# Patient Record
Sex: Male | Born: 1950
Health system: Southern US, Community
[De-identification: ages and names within clinical notes are randomized; demographics above are authoritative.]

## PROBLEM LIST (undated history)

## (undated) DIAGNOSIS — I10 Essential (primary) hypertension: Secondary | ICD-10-CM

## (undated) DIAGNOSIS — E785 Hyperlipidemia, unspecified: Secondary | ICD-10-CM

## (undated) DIAGNOSIS — I219 Acute myocardial infarction, unspecified: Secondary | ICD-10-CM

## (undated) HISTORY — PX: CORONARY STENT PLACEMENT: SHX1402

## (undated) HISTORY — PX: KNEE SURGERY: SHX244

## (undated) HISTORY — PX: VASECTOMY: SHX75

## (undated) HISTORY — PX: CARDIAC CATHETERIZATION: SHX172

## (undated) HISTORY — DX: Hyperlipidemia, unspecified: E78.5

---

## 2000-09-19 ENCOUNTER — Ambulatory Visit (HOSPITAL_COMMUNITY): Admission: RE | Admit: 2000-09-19 | Discharge: 2000-09-19 | Payer: Self-pay | Admitting: *Deleted

## 2000-09-19 ENCOUNTER — Encounter: Payer: Self-pay | Admitting: *Deleted

## 2004-12-22 ENCOUNTER — Ambulatory Visit (HOSPITAL_BASED_OUTPATIENT_CLINIC_OR_DEPARTMENT_OTHER): Admission: RE | Admit: 2004-12-22 | Discharge: 2004-12-22 | Payer: Self-pay | Admitting: Orthopedic Surgery

## 2011-02-05 ENCOUNTER — Inpatient Hospital Stay (HOSPITAL_COMMUNITY)
Admission: EM | Admit: 2011-02-05 | Discharge: 2011-02-10 | DRG: 247 | Disposition: A | Discharge status: Home / Self Care | Payer: No Typology Code available for payment source | Attending: Cardiovascular Disease | Admitting: Cardiovascular Disease

## 2011-02-05 ENCOUNTER — Emergency Department (HOSPITAL_COMMUNITY): Payer: No Typology Code available for payment source

## 2011-02-05 DIAGNOSIS — G4730 Sleep apnea, unspecified: Secondary | ICD-10-CM | POA: Diagnosis present

## 2011-02-05 DIAGNOSIS — I214 Non-ST elevation (NSTEMI) myocardial infarction: Principal | ICD-10-CM | POA: Diagnosis present

## 2011-02-05 DIAGNOSIS — E785 Hyperlipidemia, unspecified: Secondary | ICD-10-CM | POA: Diagnosis present

## 2011-02-05 DIAGNOSIS — I10 Essential (primary) hypertension: Secondary | ICD-10-CM | POA: Diagnosis present

## 2011-02-05 DIAGNOSIS — I2510 Atherosclerotic heart disease of native coronary artery without angina pectoris: Secondary | ICD-10-CM | POA: Diagnosis present

## 2011-02-05 DIAGNOSIS — E669 Obesity, unspecified: Secondary | ICD-10-CM | POA: Diagnosis present

## 2011-02-05 DIAGNOSIS — Z7982 Long term (current) use of aspirin: Secondary | ICD-10-CM

## 2011-02-05 DIAGNOSIS — Z8249 Family history of ischemic heart disease and other diseases of the circulatory system: Secondary | ICD-10-CM

## 2011-02-05 LAB — CBC
HCT: 46.7 % (ref 39.0–52.0)
Hemoglobin: 16.8 g/dL (ref 13.0–17.0)
MCH: 31.5 pg (ref 26.0–34.0)
MCHC: 36 g/dL (ref 30.0–36.0)
MCV: 87.6 fL (ref 78.0–100.0)
Platelets: 145 10*3/uL — ABNORMAL LOW (ref 150–400)
RBC: 5.33 MIL/uL (ref 4.22–5.81)
RDW: 13 % (ref 11.5–15.5)
WBC: 9.2 10*3/uL (ref 4.0–10.5)

## 2011-02-05 LAB — DIFFERENTIAL
Basophils Absolute: 0 10*3/uL (ref 0.0–0.1)
Basophils Relative: 0 % (ref 0–1)
Eosinophils Absolute: 0.4 10*3/uL (ref 0.0–0.7)
Eosinophils Relative: 5 % (ref 0–5)
Lymphocytes Relative: 32 % (ref 12–46)
Lymphs Abs: 2.9 10*3/uL (ref 0.7–4.0)
Monocytes Absolute: 0.8 10*3/uL (ref 0.1–1.0)
Monocytes Relative: 9 % (ref 3–12)
Neutro Abs: 5 10*3/uL (ref 1.7–7.7)
Neutrophils Relative %: 54 % (ref 43–77)

## 2011-02-05 LAB — BASIC METABOLIC PANEL
CO2: 29 mEq/L (ref 19–32)
Glucose, Bld: 129 mg/dL — ABNORMAL HIGH (ref 70–99)
Potassium: 3.6 mEq/L (ref 3.5–5.1)
Sodium: 139 mEq/L (ref 135–145)

## 2011-02-06 HISTORY — PX: CORONARY ANGIOPLASTY: SHX604

## 2011-02-06 HISTORY — PX: CARDIAC CATHETERIZATION: SHX172

## 2011-02-06 LAB — BRAIN NATRIURETIC PEPTIDE: Pro B Natriuretic peptide (BNP): 102 pg/mL — ABNORMAL HIGH (ref 0.0–100.0)

## 2011-02-06 LAB — CARDIAC PANEL(CRET KIN+CKTOT+MB+TROPI)
CK, MB: 3.6 ng/mL (ref 0.3–4.0)
Relative Index: 2.1 (ref 0.0–2.5)
Relative Index: 4.6 — ABNORMAL HIGH (ref 0.0–2.5)
Total CK: 172 U/L (ref 7–232)
Troponin I: 0.36 ng/mL — ABNORMAL HIGH (ref 0.00–0.06)

## 2011-02-06 LAB — PROTIME-INR
INR: 0.91 (ref 0.00–1.49)
Prothrombin Time: 12.5 s (ref 11.6–15.2)

## 2011-02-06 LAB — CK TOTAL AND CKMB (NOT AT ARMC)
CK, MB: 3.5 ng/mL (ref 0.3–4.0)
Relative Index: 1.8 (ref 0.0–2.5)
Total CK: 199 U/L (ref 7–232)

## 2011-02-06 LAB — CBC
HCT: 44.7 % (ref 39.0–52.0)
Hemoglobin: 16 g/dL (ref 13.0–17.0)
MCH: 31.3 pg (ref 26.0–34.0)
MCHC: 35.8 g/dL (ref 30.0–36.0)
MCV: 87.3 fL (ref 78.0–100.0)
Platelets: 139 K/uL — ABNORMAL LOW (ref 150–400)
RBC: 5.12 MIL/uL (ref 4.22–5.81)
RDW: 13 % (ref 11.5–15.5)
WBC: 9.4 K/uL (ref 4.0–10.5)

## 2011-02-06 LAB — POCT CARDIAC MARKERS
CKMB, poc: 1.8 ng/mL (ref 1.0–8.0)
Myoglobin, poc: 75.6 ng/mL (ref 12–200)
Troponin i, poc: <0.05 ng/mL (ref 0.00–0.09)

## 2011-02-06 LAB — POCT ACTIVATED CLOTTING TIME: Activated Clotting Time: 499 seconds

## 2011-02-06 LAB — LIPID PANEL
Cholesterol: 244 mg/dL — ABNORMAL HIGH (ref 0–200)
LDL Cholesterol: 172 mg/dL — ABNORMAL HIGH (ref 0–99)
Total CHOL/HDL Ratio: 5.7 RATIO

## 2011-02-06 LAB — HEPARIN LEVEL (UNFRACTIONATED): Heparin Unfractionated: 0.12 [IU]/mL — ABNORMAL LOW (ref 0.30–0.70)

## 2011-02-06 LAB — TROPONIN I: Troponin I: 0.1 ng/mL — ABNORMAL HIGH (ref 0.00–0.06)

## 2011-02-06 LAB — MRSA PCR SCREENING: MRSA by PCR: NEGATIVE

## 2011-02-06 LAB — APTT: aPTT: 29 seconds (ref 24–37)

## 2011-02-07 HISTORY — PX: DOPPLER ECHOCARDIOGRAPHY: SHX263

## 2011-02-07 LAB — BASIC METABOLIC PANEL
BUN: 10 mg/dL (ref 6–23)
CO2: 26 mEq/L (ref 19–32)
Calcium: 8.8 mg/dL (ref 8.4–10.5)
Creatinine, Ser: 1.03 mg/dL (ref 0.4–1.5)
GFR calc non Af Amer: >60 mL/min (ref >60)
Glucose, Bld: 116 mg/dL — ABNORMAL HIGH (ref 70–99)

## 2011-02-07 LAB — HEPARIN LEVEL (UNFRACTIONATED): Heparin Unfractionated: 0.27 IU/mL — ABNORMAL LOW (ref 0.30–0.70)

## 2011-02-07 LAB — CBC
HCT: 40.6 % (ref 39.0–52.0)
Hemoglobin: 14.2 g/dL (ref 13.0–17.0)
MCH: 30.8 pg (ref 26.0–34.0)
MCHC: 35 g/dL (ref 30.0–36.0)
MCV: 88.1 fL (ref 78.0–100.0)
RDW: 13.1 % (ref 11.5–15.5)

## 2011-02-08 LAB — BASIC METABOLIC PANEL
CO2: 28 mEq/L (ref 19–32)
Calcium: 8.9 mg/dL (ref 8.4–10.5)
Creatinine, Ser: 1.05 mg/dL (ref 0.4–1.5)
GFR calc Af Amer: >60 mL/min (ref >60)
GFR calc non Af Amer: >60 mL/min (ref >60)
Sodium: 138 mEq/L (ref 135–145)

## 2011-02-08 LAB — PROTIME-INR: Prothrombin Time: 13.2 seconds (ref 11.6–15.2)

## 2011-02-08 LAB — CBC
Hemoglobin: 14.2 g/dL (ref 13.0–17.0)
MCH: 30.6 pg (ref 26.0–34.0)
MCHC: 34.4 g/dL (ref 30.0–36.0)
Platelets: 120 10*3/uL — ABNORMAL LOW (ref 150–400)
RBC: 4.64 MIL/uL (ref 4.22–5.81)

## 2011-02-08 LAB — HEPARIN LEVEL (UNFRACTIONATED): Heparin Unfractionated: 0.32 IU/mL (ref 0.30–0.70)

## 2011-02-09 LAB — CBC
MCH: 30.9 pg (ref 26.0–34.0)
MCV: 88.7 fL (ref 78.0–100.0)
Platelets: 134 10*3/uL — ABNORMAL LOW (ref 150–400)
RDW: 13.2 % (ref 11.5–15.5)

## 2011-02-09 LAB — BASIC METABOLIC PANEL
BUN: 10 mg/dL (ref 6–23)
CO2: 30 mEq/L (ref 19–32)
Chloride: 105 mEq/L (ref 96–112)
Creatinine, Ser: 1.19 mg/dL (ref 0.4–1.5)
Glucose, Bld: 104 mg/dL — ABNORMAL HIGH (ref 70–99)

## 2011-02-09 LAB — HEPARIN LEVEL (UNFRACTIONATED): Heparin Unfractionated: 0.33 IU/mL (ref 0.30–0.70)

## 2011-02-10 LAB — BASIC METABOLIC PANEL
BUN: 8 mg/dL (ref 6–23)
Calcium: 9.4 mg/dL (ref 8.4–10.5)
Chloride: 104 mEq/L (ref 96–112)
Creatinine, Ser: 1.03 mg/dL (ref 0.4–1.5)

## 2011-02-10 LAB — CBC
MCH: 31 pg (ref 26.0–34.0)
MCHC: 35.4 g/dL (ref 30.0–36.0)
MCV: 87.7 fL (ref 78.0–100.0)
Platelets: 150 10*3/uL (ref 150–400)
RBC: 4.8 MIL/uL (ref 4.22–5.81)
RDW: 13.1 % (ref 11.5–15.5)

## 2011-02-13 LAB — POCT ACTIVATED CLOTTING TIME: Activated Clotting Time: 446 seconds

## 2011-02-19 NOTE — Procedures (Signed)
NAMETARL, CEPHAS NO.:  [id number]  MEDICAL RECORD NO.:  [id number]           PATIENT TYPE:  I  LOCATION:  2920                         FACILITY:  [facility]  PHYSICIAN:  Nicki Guadalajara, M.D.     DATE OF BIRTH:  1951/03/28  DATE OF PROCEDURE:  02/06/2011 DATE OF DISCHARGE:                           CARDIAC CATHETERIZATION   INDICATIONS:  [name] is a 60 year old gentleman who had previously undergone coronary angiography for chest pain approximately 7 years ago and was told of having essentially normal coronary arteries. Over the past month, the patient has noticed a change in development of exertional shortness of breath.  Over the past week, he has developed increasing episodes of recurrent chest pain.  Yesterday, he developed a prolonged episode of 2 hours of chest pain prompting [facility] evaluation and admission.  Initial troponin was 0.10.  CPK 199, MB normal.  EKG showed sinus rhythm with right bundle-branch block and LVH. Due to worrisome symptoms for unstable angina with mild troponin increase, definitive cardiac catheterization was recommended.  PROCEDURE:  After premedication with Versed 2 mg plus fentanyl 50 mcg, the patient was prepped and draped in the usual fashion.  His right femoral artery was punctured anteriorly and a 5-French sheath was inserted without difficulty.  Of note, during the procedure, the patient did have significant snoring with sleep disordered breathing suggesting possible obstructive sleep apnea.  Diagnostic catheterization was done utilizing 5-French Judkins for left and right coronary catheters.  A 5- French pigtail catheter was used for left ventriculography.  Distal aortography was also performed since he was significantly hypertensive to make certain he did not have any renovascular etiology.  With the demonstration of a culprit high-grade 90-95% stenosis in the very large dominant circumflex as well  as narrowing in the right coronary artery, decision was made to attempt culprit vessel intervention.  The 5-French sheath was upgraded to a 6-French system.  Bivalirudin was administered in bolus plus drip and ACT was documented to be therapeutic.  The order was given for oral Plavix 600 mg.  However, there was delay in administering this, but the patient received this later in the procedure.  The patient was titrated on increasing doses of IV nitroglycerin secondary to his blood pressure elevation.  A 6-French XP 4.0 guide was used for the intervention.  IC nitroglycerin 200 mcg on several occasions was administered down the left coronary system.  The Prowater wire was advanced across the lesion.  Due to the eccentricity of the lesion and with the lesion extending up to a marginal branch, the decision was made to utilize cutting balloon arthrotomy for predilatation.  A 2.0 x 15-mm Flextome cutting balloon was then inserted with multiple dilatations made up to 7 atmospheres.  This was then removed.  A 3.5 x 16-mm Promus Element DES everolimus stent was then inserted and successfully deployed x2 up to 15 atmospheres.  A 4.0 x 15- mm noncompliant tract was used for post-stent dilatation up to approximately 4.0 mm.  With each balloon inflations, the patient did experience significant chest discomfort.  His blood pressure did increase to  180-200 systolically.  His IV nitroglycerin drip was titrated up to 50 mcg.  He also did receive several doses of intracoronary nitroglycerin and also received morphine sulfate 3 mg. SCOUT angiography confirmed an excellent angiographic result.  Plans will be for stage intervention to the RCA.  HEMODYNAMIC DATA:  Initial central aortic pressure was 150/100.  Left ventricular pressure 150/7, post A-wave 14.  During the procedure, the blood pressure did increase to approximately 190 systolically and 100- 110 diastolically.  On LV pullback, pressure was 150/12,  post A-wave 22, and aortic pressure was 150/98.  ANGIOGRAPHIC DATA:  Left main coronary artery was normal vessel, which bifurcated into an LAD and left circumflex system.  The LAD gave rise to 2 proximal diagonal vessels and septal perforating artery.  There was 40% LAD narrowing after the second diagonal vessel. The LAD extended to the apex.  The circumflex vessel was a very large dominant vessel that gave rise to 4 obtuse marginal vessels and ended in the PLA branch.  There was eccentric diffuse 90-95% stenosis in the proximal segment extending up to the takeoff of the first marginal vessel.  There was smooth narrowing of 60% in the proximal third portion of the OM1 vessel.  The right coronary artery was small to moderate-sized vessel that gave rise to the PDA, but the PLA rose from the circumflex system.  There was smooth 80-85% narrowing which seemed to narrow up to 90% in some views in the proximal segment.  Left ventriculography revealed normal LV contractility with an ejection fraction of approximately 65%.  Distal aortography revealed patent renal arteries without renal artery stenosis.  Following coronary intervention to the left circumflex system which was done utilizing cutting balloon arthrotomy, as well as stenting with a 3.5 x 16-mm Promus Element stent postdilated to 4.0 mm, the proximal circumflex stenosis was reduced to 0%.  There was brisk TIMI 3 flow. There was no evidence for dissection.  IMPRESSION: 1. Normal hyperdynamic left ventricular function. 2. Multivessel coronary artery disease with 40% narrowing in the mid     left anterior descending after the second diagonal vessel; 90-95%     culprit stenosis in the proximal circumflex vessel; 80-90% stenosis     in the proximal right coronary artery. 3. Successful culprit intervention to a large dominant left circumflex     system treated with cutting balloon arthrotomy, stenting with a 3.5     x 16-mm  everolimus Promus stent, postdilated to 4.0 mm. 4. Bivalirudin IC and IV nitroglycerin/600 mg oral Plavix.          ______________________________ Nicki Guadalajara, M.D.     TK/MEDQ  D:  02/06/2011  T:  02/07/2011  Job:  [id number]  Electronically Signed by Nicki Guadalajara M.D. on 02/19/2011 05:17:36 PM

## 2011-02-28 NOTE — H&P (Signed)
[name], [name] NO.:  [id number]  MEDICAL RECORD NO.:  [id number]           PATIENT TYPE:  LOCATION:                                 FACILITY:  PHYSICIAN:  Mario Torres, M.D.   DATE OF BIRTH:  [birth year]  DATE OF ADMISSION:  02/05/2011 DATE OF DISCHARGE:                             HISTORY & PHYSICAL   CHIEF COMPLAINT:  Chest pain.  HISTORY OF PRESENT ILLNESS:  Mario Torres is a very pleasant 60 year old white male with no previous cardiac history.  He underwent cardiac catheterization 6-7 years ago at [facility].  Per the patient, his catheterization was within normal limits, and he was treated for reflux symptoms at that time.  He presents to the emergency department tonight after a 1-week history of exertional chest discomfort.  States that he would develop substernal chest pain as well as shortness of breath with physical exertion.  His symptoms would improve after 10-15 minutes of rest.  Last night, however, he began to experience discomfort while he was in bed, he was awakened from sleep with chest pain which was much more severe.  He complained of right arm discomfort and numbness, shortness of breath as well as diaphoresis and nausea.  His symptoms persisted throughout the day while he was at work, and he did experience again chest pain with associated nausea, vomiting, and diaphoresis with this.  These symptoms lasted for about 2 hours and then slowly abated on arrival to the emergency department.  His EKG reveals sinus rhythm with a right bundle-branch block, and at the time of arrival, his pain was about a 4 or 5 out of 10.  He was treated with aspirin and sublingual nitroglycerin which did alleviate his discomfort for a short period of time; however, currently his pain level is at a 2/10.  His cardiac point-of-care markers have been negative, and there are no ischemic ST-T wave changes on his EKG.  PAST MEDICAL HISTORY: 1.  Chest pain.     a.     Cardiac catheterization 6-7 years ago revealing normal      coronary arteries. 2. Elevated blood pressure. 3. Elevated lipids which are untreated.  FAMILY HISTORY:  Positive for coronary artery disease in his father.  SOCIAL HISTORY:  He is married.  Denies any tobacco, alcohol, or any illicit drug use.  ALLERGIES:  None known.  CURRENT MEDICATIONS:  None.  REVIEW OF SYSTEMS:  He does complain of some occasional heartburn or indigestion, which is a completely different discomfort than he is experiencing now.  He has also been bothered with leg cramps here recently.  All other systems have been reviewed and are negative.  His wife reports that he does snore and awakens at night gasping for breath.  PHYSICAL EXAMINATION:  VITAL SIGNS:  Blood pressure is 126/84, pulse is 73 and regular, respirations 13, and temperature is 98.3. GENERAL:  This is a very pleasant 60 year old white male in no acute distress. HEENT:  Pupils are equal, reactive to light and accommodation. Extraocular movements intact. NECK:  Supple without JVD, no thyromegaly or carotid bruits. CARDIOVASCULAR:  Regular rate  and rhythm, S1 and S2; without appreciable murmur, gallop or rub. LUNGS:  Clear to auscultation bilaterally with normal respiratory effort. ABDOMEN:  Soft, nontender without hepatosplenomegaly or masses. EXTREMITIES:  Radial, femoral, and dorsal pedal arteries are present without lower extremity edema.  No clubbing, cyanosis, or ulcers. NEUROLOGIC:  Oriented to person, place, and time.  Normal mood and affect. SKIN:  Pink, warm, and dry.  LABORATORY DATA:  EKG reveals normal sinus rhythm with right bundle- branch block.  CBC:  White blood cell count is 9.2, hemoglobin is 16.8, hematocrit 46.7, and platelets are 145.  Point-of-care markers: Myoglobin is 88.1, CK-MB 2.1, and troponin is less than 0.05.  BMET: Sodium is 139, potassium is 3.6, chloride 103, CO2 is 29, BUN  13, creatinine 1.02, and glucose is 129.  Chest x-ray: Cardiomegaly and mild pulmonary vascular congestion, diffuse interstitial coarsening which is likely chronic.  IMPRESSION: 1. Chest pain/unstable angina. 2. Family history of coronary artery disease. 3. History of elevated blood pressure. 4. History of elevated cholesterol. 5. Obesity. 6. Possible sleep apnea.  PLAN:  We will admit him to the step-down unit.  We will continue to cycle cardiac enzymes and follow his EKGs.  We will start IV nitroglycerin at 5 __________ and titrate that for any persistent chest discomfort.  We will ask pharmacy to dose heparin and start him on that in the emergency department.  We will continue with aspirin 325 daily and also begin Lopressor 25 mg p.o. b.i.d.  We will place him on Protonix.  He is to remain n.p.o. after midnight tonight, and we will plan for left heart catheterization and coronary arteriogram in the morning to assess for obstructive coronary artery disease.  We will also check an echo given his x-ray finding of cardiomegaly, to assess for any structural abnormality.  I have discussed this in detail with the patient and his wife, and they agree to the plan of care.    ______________________________ Mario College, Mario Torres   ______________________________ Mario Torres, M.D.    LS/MEDQ  D:  02/06/2011  T:  02/06/2011  Job:  [id number]  cc:   [facility]  Electronically Signed by Charmian Muff Mario Torres on 02/21/2011 10:04:33 AM Electronically Signed by Mario Torres M.D. on 02/28/2011 01:57:07 PM

## 2011-02-28 NOTE — Discharge Summary (Signed)
NAMEKELTON, Mario Torres NO.:  [id number]  MEDICAL RECORD NO.:  [id number]           PATIENT TYPE:  I  LOCATION:  6524                         FACILITY:  [facility]  PHYSICIAN:  Nanetta Batty, M.D.   DATE OF BIRTH:  24-Feb-1951  DATE OF ADMISSION:  02/05/2011 DATE OF DISCHARGE:  02/10/2011                              DISCHARGE SUMMARY   DISCHARGE DIAGNOSES: 1. Non-ST elevation myocardial infarction.  Status post percutaneous     coronary intervention to the left circumflex artery in the RCA with     Promus drug-eluting stents. 2. Ejection fraction 65%. 3. Hypertension. 4. Dyslipidemia.  HOSPITAL COURSE:  Mario Torres is a 60 year old Caucasian male with no previous cardiac history.  He did undergo a cardiac catheterization 6-7 years ago at [facility].  However, this showed normal coronary arteries.  The patient was treated for reflux symptoms at that time.  He presented to the emergency department here at [facility] after 1-week history of exertional chest discomfort.  He developed substernal chest pain as well as shortness of breath with physical exertion.  This improved with 10-50 minutes of rest.  The night prior to admission, he did experience discomfort while in bed, and he was awakened from sleep with chest pain, which was more severe.  He also complained of right arm discomfort and numbness shortness, shortness of breath, diaphoresis, and nausea.  Mario Torres was admitted to the Step-Down Unit.  Cardiac enzymes were cycled.  His peak troponin was 1.0.  His peak CK-MB was 11.0.  He was also started on IV nitroglycerin titrated for chest discomfort. Heparin was dosed per pharmacy IV.  He was started on aspirin 325 mg as well as Lopressor 25 b.i.d., and Protonix.  He was scheduled for coronary left heart catheterization on February 06, 2011.  The cardiac cath revealed normal hyperdynamic left ventricular function with an ejection fraction of  approximately 65%.  It was multivessel coronary artery disease with 40% narrowing in the mid left anterior descending. After the second diagonal vessel, a 90-95% culprit stenosis in the proximal circumflex vessel, 80-90% stenosis in the proximal right coronary artery.  It was a successful intervention to the large dominant left circumflex system treated with cutting balloon arthrotomy and stenting with a 3.5 x 16 mm Everolimus Promus stent.  Mario Torres continued without chest pain after his intervention.  It was a planned staged intervention to the RCA for February 09, 2011.  A 2D echocardiogram was ordered, which showed an ejection fraction ranging from 50-55%.  Cavity size was normal with moderate posterior wall hypokinesis.  This was technically difficult to interpret.  Left atrium was mildly to moderately dilated.  Right atrium was mildly dilated.  No patent foramen ovale defect in the atrial septum.  Cardiac rehab was initiated.  On February 07, 2011, the patient complained of chest pain, looks like.  Heparin drip was started.  Right arm pain as well as.  His nitro drip was increased.  EKG was taken and showed no acute changes. The patient had no chest pain or concerns on the night of February 08, 2011.  Orders were completed for staged PCI to the RCA February 09, 2011. The patient received a Promus drug-eluting stent to the RCA on February 09, 2011.  There were no complications.  Currently without chest pain.  He has been seen by Dr. Allyson Sabal, feels he is stable for discharge.  He will follow up with Dr. Tresa Endo in 1-2 weeks.  DISCHARGE LABORATORY DATA:  WBCs 9.9, hemoglobin 14.4, hematocrit 41.3, platelets 120, INR 0.98.  Sodium 138, potassium 4.4, chloride 102, carbon dioxide 28, glucose 93, BUN 10, creatinine 1.05, calcium 8.9, total cholesterol 244, triglycerides 143, HDL 43, LDL 172.  TSH 2.651.  STUDIES/PROCEDURES: 1. Chest x-ray on February 05, 2011, revealed cardiomegaly and  mild     pulmonary vascular congestion.  There was diffuse interstitial     coarsening.  It was likely chronic. 2. Cardiac catheterization on February 06, 2011.  Impression:  Normal     hyperdynamic left ventricular function.  Multivessel coronary     artery disease infarction narrowing in the left anterior descending     after the second diagonal vessel 90-95% stenosis of the proximal     circumflex vessel, 80-90% stenosis of the proximal right coronary     artery.  They were successful intervention to the large dominant     left circumflex system with a cutting balloon arthrotomy and     stenting with Promus drug-eluting stent. 3. Cardiac catheterization on February 09, 2011.  Successful     percutaneous coronary intervention to the right coronary artery     with a Promus drug-eluting stent. 4. Echocardiogram on February 07, 2011.  Study conclusions:  Left     ventricle cavity size was normal.  There was mild focal basal     hypertrophy in the septum.  Systolic function was normal.  Ejection     fraction in the range of 50-55%.  There is mild posterior     hypokinesis but very difficult to interpret.  Left ventricular     diastolic functional parameters were normal.  Left atrium was mild     to moderately dilated.  Right atrium was mildly dilated.  There was     no atrial septum defect or patent foramen ovale.  DISCHARGE MEDICATIONS: 1. Aspirin 325 mg 1 tablet by mouth daily. 2. Plavix 75 mg 1 tablet by mouth daily with a meal. 3. Imdur 30 mg 1 tablet by mouth daily. 4. Lisinopril 10 mg 1 tablet by mouth daily. 5. Metoprolol 50 mg 1 tablet by mouth twice daily. 6. Nitroglycerin sublingual 0.4 mg 1 tablet by mouth every 5 minutes     under the tongue as needed for chest pain up to 3 doses maximum. 7. Pantoprazole 40 mg, enteric coated 1 tablet by mouth daily. 8. Crestor 40 mg 1 tablet by mouth daily.  FINAL DISPOSITION:  Mario Torres will be discharged home in stable condition.  He  is to increase his activity slowly.  He may shower and bathe.  No lifting or driving for 2 days.  He is to return to work on February 19, 2011.  He is to get a heart-healthy diet.  If catheter site becomes red, painful, swollen, or discharges fluid, he is to call our office.  He will follow up with Dr. Tresa Endo in approximately 1-2 weeks, and our office will call him with the appointment time.    ______________________________ Wilburt Finlay, PA   ______________________________ Nanetta Batty, M.D.    BH/MEDQ  D:  02/10/2011  T:  02/10/2011  Job:  [id number]  cc:   Nicki Guadalajara, M.D.  Electronically Signed by Wilburt Finlay PA on 02/14/2011 04:12:59 PM Electronically Signed by Nanetta Batty M.D. on 02/28/2011 01:57:04 PM

## 2012-02-19 HISTORY — PX: NM MYOCAR PERF WALL MOTION: HXRAD629

## 2012-02-20 IMAGING — CR DG CHEST 1V PORT
1 series · 1 of 1 positions shown · non-contrast
Comparison: None.

CLINICAL DATA: Chest pain.

PORTABLE CHEST - 1 VIEW

[AP]
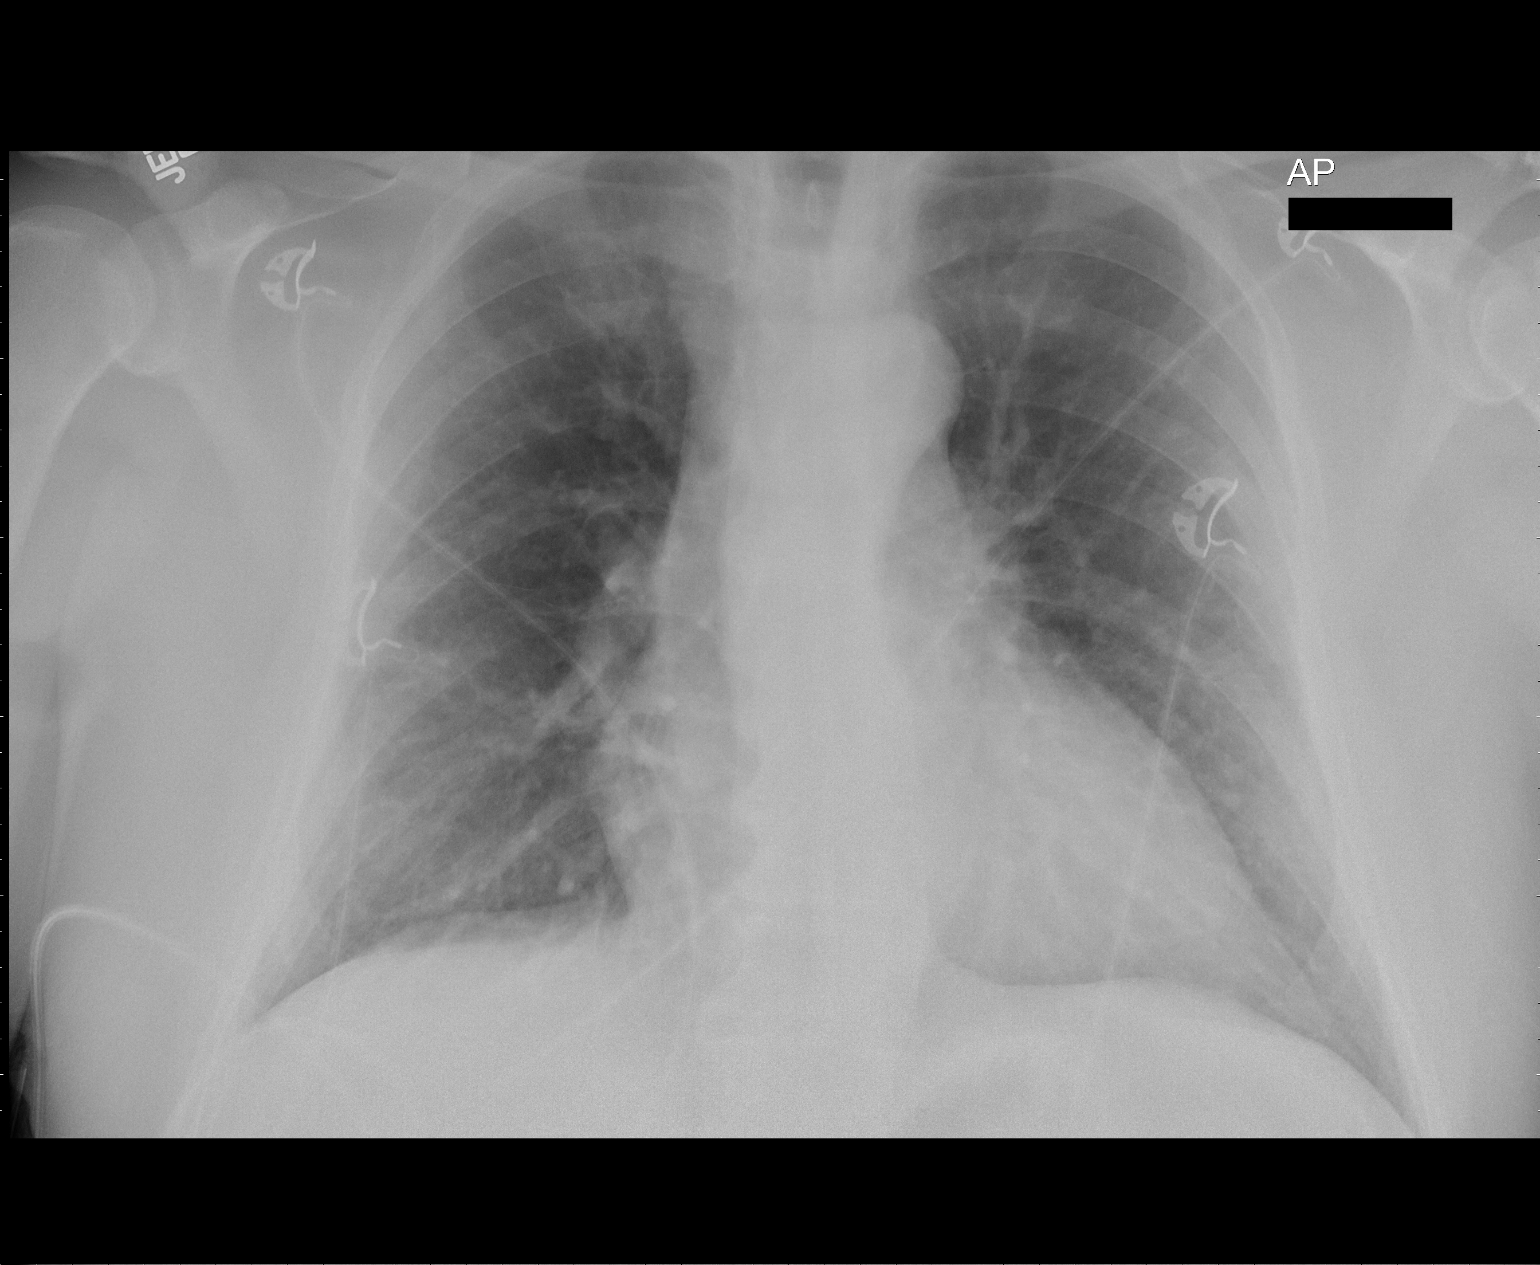

[1 of 1 positions shown; findings below may reference images not displayed]

FINDINGS: The heart is mildly enlarged.  Mild pulmonary vascular
congestion is present.  No significant pleural effusions are seen.
Mild diffuse interstitial coarsening is likely chronic.
IMPRESSION: 1.  Cardiomegaly and mild pulmonary vascular congestion.
2.  Diffuse interstitial coarsening is likely chronic.

## 2013-08-07 ENCOUNTER — Encounter (HOSPITAL_COMMUNITY): Payer: Self-pay | Admitting: Emergency Medicine

## 2013-08-07 ENCOUNTER — Emergency Department (HOSPITAL_COMMUNITY): Payer: 59

## 2013-08-07 ENCOUNTER — Inpatient Hospital Stay (HOSPITAL_COMMUNITY)
Admission: EM | Admit: 2013-08-07 | Discharge: 2013-08-11 | DRG: 287 | Disposition: A | Discharge status: Home / Self Care | Payer: 59 | Attending: Cardiology | Admitting: Cardiology

## 2013-08-07 DIAGNOSIS — I2510 Atherosclerotic heart disease of native coronary artery without angina pectoris: Principal | ICD-10-CM | POA: Diagnosis present

## 2013-08-07 DIAGNOSIS — Z79899 Other long term (current) drug therapy: Secondary | ICD-10-CM

## 2013-08-07 DIAGNOSIS — Z9861 Coronary angioplasty status: Secondary | ICD-10-CM

## 2013-08-07 DIAGNOSIS — I200 Unstable angina: Secondary | ICD-10-CM | POA: Diagnosis present

## 2013-08-07 DIAGNOSIS — Z9119 Patient's noncompliance with other medical treatment and regimen: Secondary | ICD-10-CM

## 2013-08-07 DIAGNOSIS — I252 Old myocardial infarction: Secondary | ICD-10-CM

## 2013-08-07 DIAGNOSIS — E669 Obesity, unspecified: Secondary | ICD-10-CM | POA: Diagnosis present

## 2013-08-07 DIAGNOSIS — I4510 Unspecified right bundle-branch block: Secondary | ICD-10-CM | POA: Diagnosis present

## 2013-08-07 DIAGNOSIS — I10 Essential (primary) hypertension: Secondary | ICD-10-CM | POA: Diagnosis present

## 2013-08-07 DIAGNOSIS — I214 Non-ST elevation (NSTEMI) myocardial infarction: Secondary | ICD-10-CM

## 2013-08-07 DIAGNOSIS — K219 Gastro-esophageal reflux disease without esophagitis: Secondary | ICD-10-CM | POA: Diagnosis present

## 2013-08-07 DIAGNOSIS — Z8249 Family history of ischemic heart disease and other diseases of the circulatory system: Secondary | ICD-10-CM

## 2013-08-07 DIAGNOSIS — E785 Hyperlipidemia, unspecified: Secondary | ICD-10-CM | POA: Diagnosis present

## 2013-08-07 DIAGNOSIS — Z7982 Long term (current) use of aspirin: Secondary | ICD-10-CM

## 2013-08-07 DIAGNOSIS — Z91199 Patient's noncompliance with other medical treatment and regimen due to unspecified reason: Secondary | ICD-10-CM

## 2013-08-07 DIAGNOSIS — I209 Angina pectoris, unspecified: Secondary | ICD-10-CM

## 2013-08-07 HISTORY — DX: Essential (primary) hypertension: I10

## 2013-08-07 HISTORY — DX: Acute myocardial infarction, unspecified: I21.9

## 2013-08-07 LAB — URINALYSIS, ROUTINE W REFLEX MICROSCOPIC
Bilirubin Urine: NEGATIVE
Hgb urine dipstick: NEGATIVE
Protein, ur: NEGATIVE mg/dL
Urobilinogen, UA: 1 mg/dL (ref 0.0–1.0)

## 2013-08-07 LAB — HEMOGLOBIN A1C
Hgb A1c MFr Bld: 5.4 % (ref <5.7)
Mean Plasma Glucose: 108 mg/dL (ref <117)

## 2013-08-07 LAB — POCT I-STAT TROPONIN I: Troponin i, poc: 0 ng/mL (ref 0.00–0.08)

## 2013-08-07 LAB — TROPONIN I
Troponin I: <0.3 ng/mL (ref <0.30)
Troponin I: <0.3 ng/mL (ref <0.30)

## 2013-08-07 LAB — CBC
HCT: 48 % (ref 39.0–52.0)
MCHC: 36 g/dL (ref 30.0–36.0)
RDW: 12.9 % (ref 11.5–15.5)

## 2013-08-07 LAB — POCT I-STAT, CHEM 8
Creatinine, Ser: 1.2 mg/dL (ref 0.50–1.35)
Glucose, Bld: 78 mg/dL (ref 70–99)
Hemoglobin: 17.3 g/dL — ABNORMAL HIGH (ref 13.0–17.0)
Sodium: 139 mEq/L (ref 135–145)
TCO2: 26 mmol/L (ref 0–100)

## 2013-08-07 LAB — MRSA PCR SCREENING: MRSA by PCR: NEGATIVE

## 2013-08-07 MED ORDER — SODIUM CHLORIDE 0.9 % IV SOLN
INTRAVENOUS | Status: DC
  Administered 2013-08-09: 21:00:00 via INTRAVENOUS
  Administered 2013-08-10: 75 mL/h via INTRAVENOUS

## 2013-08-07 MED ORDER — ALPRAZOLAM 0.25 MG PO TABS
0.2500 mg | ORAL_TABLET | Freq: Two times a day (BID) | ORAL | Status: DC | PRN
  Administered 2013-08-08 – 2013-08-10 (×3): 0.25 mg via ORAL
  Filled 2013-08-07 (×3): qty 1

## 2013-08-07 MED ORDER — HEPARIN BOLUS VIA INFUSION
4000.0000 [IU] | Freq: Once | INTRAVENOUS | Status: AC
  Administered 2013-08-07: 4000 [IU] via INTRAVENOUS
  Filled 2013-08-07: qty 4000

## 2013-08-07 MED ORDER — HYDRALAZINE HCL 20 MG/ML IJ SOLN
10.0000 mg | INTRAMUSCULAR | Status: DC | PRN
  Administered 2013-08-08 – 2013-08-11 (×5): 10 mg via INTRAVENOUS
  Filled 2013-08-07 (×5): qty 1

## 2013-08-07 MED ORDER — NITROGLYCERIN 0.4 MG SL SUBL: 0.4000 mg | SUBLINGUAL_TABLET | SUBLINGUAL | Status: DC | PRN

## 2013-08-07 MED ORDER — NITROGLYCERIN 0.4 MG SL SUBL
0.4000 mg | SUBLINGUAL_TABLET | SUBLINGUAL | Status: DC | PRN
  Administered 2013-08-07 (×2): 0.4 mg via SUBLINGUAL
  Filled 2013-08-07: qty 25

## 2013-08-07 MED ORDER — ASPIRIN 81 MG PO CHEW
324.0000 mg | CHEWABLE_TABLET | ORAL | Status: AC
Start: 2013-08-10 — End: 2013-08-10
  Administered 2013-08-10: 324 mg via ORAL
  Filled 2013-08-07: qty 1

## 2013-08-07 MED ORDER — CARVEDILOL 6.25 MG PO TABS
6.2500 mg | ORAL_TABLET | Freq: Two times a day (BID) | ORAL | Status: DC
  Administered 2013-08-07 – 2013-08-08 (×2): 6.25 mg via ORAL
  Filled 2013-08-07 (×4): qty 1

## 2013-08-07 MED ORDER — HEPARIN (PORCINE) IN NACL 100-0.45 UNIT/ML-% IJ SOLN
1650.0000 [IU]/h | INTRAMUSCULAR | Status: DC
  Administered 2013-08-07: 1350 [IU]/h via INTRAVENOUS
  Administered 2013-08-08 – 2013-08-10 (×3): 1650 [IU]/h via INTRAVENOUS
  Filled 2013-08-07 (×9): qty 250

## 2013-08-07 MED ORDER — ONDANSETRON HCL 4 MG/2ML IJ SOLN
4.0000 mg | Freq: Four times a day (QID) | INTRAMUSCULAR | Status: DC | PRN
  Administered 2013-08-08 – 2013-08-09 (×3): 4 mg via INTRAVENOUS
  Filled 2013-08-07 (×3): qty 2

## 2013-08-07 MED ORDER — ASPIRIN EC 81 MG PO TBEC
81.0000 mg | DELAYED_RELEASE_TABLET | Freq: Every day | ORAL | Status: DC
  Administered 2013-08-08 – 2013-08-11 (×3): 81 mg via ORAL
  Filled 2013-08-07 (×4): qty 1

## 2013-08-07 MED ORDER — REGADENOSON 0.4 MG/5ML IV SOLN
0.4000 mg | Freq: Once | INTRAVENOUS | Status: DC
  Filled 2013-08-07: qty 5

## 2013-08-07 MED ORDER — METOPROLOL TARTRATE 12.5 MG HALF TABLET: 12.5000 mg | ORAL_TABLET | Freq: Two times a day (BID) | ORAL | Status: DC

## 2013-08-07 MED ORDER — ASPIRIN 81 MG PO CHEW
324.0000 mg | CHEWABLE_TABLET | Freq: Once | ORAL | Status: AC
  Administered 2013-08-07: 324 mg via ORAL
  Filled 2013-08-07: qty 4

## 2013-08-07 MED ORDER — SODIUM CHLORIDE 0.9 % IV SOLN
INTRAVENOUS | Status: DC
  Administered 2013-08-07: 15:00:00 via INTRAVENOUS

## 2013-08-07 MED ORDER — SODIUM CHLORIDE 0.9 % IJ SOLN
3.0000 mL | Freq: Two times a day (BID) | INTRAMUSCULAR | Status: DC
  Administered 2013-08-08 – 2013-08-10 (×3): 3 mL via INTRAVENOUS

## 2013-08-07 MED ORDER — MORPHINE SULFATE 2 MG/ML IJ SOLN
INTRAMUSCULAR | Status: AC
  Filled 2013-08-07: qty 1

## 2013-08-07 MED ORDER — ASPIRIN 300 MG RE SUPP
300.0000 mg | RECTAL | Status: AC
  Filled 2013-08-07: qty 1

## 2013-08-07 MED ORDER — NITROGLYCERIN IN D5W 200-5 MCG/ML-% IV SOLN
3.0000 ug/min | INTRAVENOUS | Status: DC
  Administered 2013-08-07: 10 ug/min via INTRAVENOUS
  Filled 2013-08-07: qty 250

## 2013-08-07 MED ORDER — SODIUM CHLORIDE 0.9 % IV SOLN: 250.0000 mL | INTRAVENOUS | Status: DC | PRN

## 2013-08-07 MED ORDER — SODIUM CHLORIDE 0.9 % IV SOLN
1000.0000 mL | Freq: Once | INTRAVENOUS | Status: AC
  Administered 2013-08-07: 1000 mL via INTRAVENOUS

## 2013-08-07 MED ORDER — ATORVASTATIN CALCIUM 20 MG PO TABS
20.0000 mg | ORAL_TABLET | Freq: Every day | ORAL | Status: DC
  Administered 2013-08-07: 20 mg via ORAL
  Filled 2013-08-07 (×2): qty 1

## 2013-08-07 MED ORDER — MORPHINE SULFATE 4 MG/ML IJ SOLN
4.0000 mg | Freq: Once | INTRAMUSCULAR | Status: AC
  Administered 2013-08-07: 4 mg via INTRAVENOUS
  Filled 2013-08-07: qty 1

## 2013-08-07 MED ORDER — ASPIRIN 81 MG PO CHEW: 324.0000 mg | CHEWABLE_TABLET | ORAL | Status: AC

## 2013-08-07 MED ORDER — ACETAMINOPHEN 325 MG PO TABS
650.0000 mg | ORAL_TABLET | ORAL | Status: DC | PRN
  Administered 2013-08-08 – 2013-08-10 (×9): 650 mg via ORAL
  Filled 2013-08-07 (×9): qty 2

## 2013-08-07 MED ORDER — ONDANSETRON HCL 4 MG/2ML IJ SOLN
4.0000 mg | Freq: Once | INTRAMUSCULAR | Status: AC
  Administered 2013-08-07: 4 mg via INTRAVENOUS
  Filled 2013-08-07: qty 2

## 2013-08-07 MED ORDER — MORPHINE SULFATE 2 MG/ML IJ SOLN
2.0000 mg | INTRAMUSCULAR | Status: DC | PRN
  Administered 2013-08-07 – 2013-08-10 (×8): 2 mg via INTRAVENOUS
  Filled 2013-08-07 (×7): qty 1

## 2013-08-07 MED ORDER — SODIUM CHLORIDE 0.9 % IJ SOLN: 3.0000 mL | INTRAMUSCULAR | Status: DC | PRN

## 2013-08-07 MED ORDER — NITROGLYCERIN IN D5W 200-5 MCG/ML-% IV SOLN
INTRAVENOUS | Status: AC
  Filled 2013-08-07: qty 250

## 2013-08-07 MED ORDER — ZOLPIDEM TARTRATE 5 MG PO TABS
5.0000 mg | ORAL_TABLET | Freq: Every evening | ORAL | Status: DC | PRN
  Administered 2013-08-07 – 2013-08-10 (×4): 5 mg via ORAL
  Filled 2013-08-07 (×4): qty 1

## 2013-08-07 MED ORDER — SODIUM CHLORIDE 0.9 % IV SOLN
1000.0000 mL | INTRAVENOUS | Status: DC
  Administered 2013-08-07: 1000 mL via INTRAVENOUS

## 2013-08-07 NOTE — ED Provider Notes (Signed)
CSN: [id number]     Arrival date & time 08/07/13  0102 History     First MD Initiated Contact with Patient 08/07/13 [phone]     Chief Complaint  Patient presents with  . Chest Pain   (Consider location/radiation/quality/duration/timing/severity/associated sxs/prior Treatment) HPI  62 year old male with history of hypertension, prior MI presents complaining of chest pain. Patient reports since yesterday morning he has had intermittent midsternal to left chest and left arm discomfort. Describe sensation as a tense, pressure sensation with associated nausea, shortness of breath, mild diaphoresis, and lightheadedness. Symptoms worsen with movement and walking around it seems to improve with rest. Patient works at [employer]. Symptoms persist even to this morning which concerns patient and prompted to come to the ER. No associated fever, chills, headache, productive cough, hemoptysis, back pain, abdominal pain, vomiting, diarrhea, numbness or weakness. No specific treatment tried with exception of one nitroglycerin yesterday without relief. Patient states he had prior MI with cardiac stenting 2 years ago. He discontinued all of his medication in January because "it makes me feel rundown". He has not follow up with his cardiologist since. Patient is a nonsmoker with no significant family history of cardiac disease. Patient has no other complaints. Patient denies dyspnea on exertion, hemoptysis. No other significant risk factor for PE including no recent surgery, prolonged bed rest, history of cancer, prior PE or DVT.  Past Medical History  Diagnosis Date  . Hypertension   . MI (myocardial infarction)    Past Surgical History  Procedure Laterality Date  . Coronary stent placement    . Knee surgery     No family history on file. History  Substance Use Topics  . Smoking status: Never Smoker   . Smokeless tobacco: Not on file  . Alcohol Use: No    Review of Systems  All other systems reviewed  and are negative.    Allergies  Review of patient's allergies indicates no known allergies.  Home Medications  No current outpatient prescriptions on file. BP 163/107  Pulse 74  Temp(Src) 98 F (36.7 C) (Oral)  Resp 18  Ht 5\' 10"  (1.778 m)  Wt 235 lb (106.595 kg)  BMI 33.72 kg/m2  SpO2 95% Physical Exam  Nursing note and vitals reviewed. Constitutional: He appears well-developed and well-nourished. No distress.  Awake, alert, nontoxic appearance  HENT:  Head: Atraumatic.  Eyes: Conjunctivae are normal. Right eye exhibits no discharge. Left eye exhibits no discharge.  Neck: Normal range of motion. Neck supple.  Cardiovascular: Normal rate, regular rhythm and intact distal pulses.   Pulmonary/Chest: Effort normal. No respiratory distress. He exhibits no tenderness.  Abdominal: Soft. There is no tenderness. There is no rebound.  Musculoskeletal: He exhibits no edema and no tenderness.  ROM appears intact, no obvious focal weakness  Neurological: He is alert.  Skin: Skin is warm and dry. No rash noted.  Psychiatric: He has a normal mood and affect.    ED Course   Procedures (including critical care time)   Date: 08/07/2013  Rate: 76  Rhythm: normal sinus rhythm  QRS Axis: left  Intervals: normal  ST/T Wave abnormalities: nonspecific ST/T changes  Conduction Disutrbances:right bundle branch block and left anterior fascicular block  Narrative Interpretation:   Old EKG Reviewed: unchanged  9:29 AM Patient presents with angina equivalent pain since yesterday. He is to endorse mild chest discomfort. EKG shows no acute ischemic changes. Workup initiated. Aspirin and supplemental nitroglycerin given. Will have close monitoring. Care discussed with attending.  10:13 AM Pain has improved after receiving treatment.  CXR without acute pathology, trop neg.  I have consulted with [facility] provider, who will see pt in ER for further management.  Pt currently stable.    Labs Reviewed   CBC - Abnormal; Notable for the following:    Hemoglobin 17.3 (*)    All other components within normal limits  POCT I-STAT, CHEM 8 - Abnormal; Notable for the following:    Hemoglobin 17.3 (*)    All other components within normal limits  URINALYSIS, ROUTINE W REFLEX MICROSCOPIC  POCT I-STAT TROPONIN I   Dg Chest 2 View  08/07/2013   *RADIOLOGY REPORT*  Clinical Data: Chest pain  CHEST - 2 VIEW  Comparison:  February 05, 2011  Findings: There is no edema or consolidation.  Lungs are mildly hyperexpanded.  The heart size is within normal limits.  There is mild pulmonary venous hypertension.  No adenopathy.  There is degenerative change in the thoracic spine.  IMPRESSION: There is a degree of pulmonary venous hypertension.  There is no edema or effusion.  No consolidation.  Heart size is normal.   Original Report Authenticated By: Bretta Bang, M.D.   1. Angina pectoris     MDM  BP 167/118  Pulse 70  Temp(Src) 98 F (36.7 C) (Oral)  Resp 16  Ht 5\' 10"  (1.778 m)  Wt 235 lb (106.595 kg)  BMI 33.72 kg/m2  SpO2 94%  I have reviewed nursing notes and vital signs. I personally reviewed the imaging tests through PACS system  I reviewed available ER/hospitalization records thought the EMR     Fayrene Helper, [place] 08/07/13 1056

## 2013-08-07 NOTE — H&P (Signed)
Chief Complaint: Chest Pain  HPI: The patient is a 62 year old male followed by Dr. Tresa Endo, who, in February 2012, suffered an acute coronary syndrome. Emergent catheterization on February 06, 2011, showed 95% proximal circumflex stenosis as well as 90% RCA stenosis proximally. In addition, he had 60% stenosis of the OM-1 branch of the circumflex and 40% LAD stenosis. A 3.5 x 16 mm DE Promus stent was inserted in the circumflex, post dilated to 4.0 mm acutely. Three days later, he underwent staged intervention to his 90% to 95% proximal RCA stenosis and has been on medical therapy for concomitant CAD. The patient underwent a 1-year followup nuclear perfusion study to assess for late restenosis following his acute coronary syndrome. This was done on February 19, 2012, and showed fairly normal perfusion without scar or ischemia. Post stress ejection fraction was 61%. Additional problems include hypertension, hyperlipidemia.  He presented to the [facility] ER with a complaint of chest pain x 1 day. The pain first occurred at rest yesterday morning. Described as substernal chest pressure. The pain has been intermittent. It recurrs and is increased with exertion. He has had some radiation to the left upper extremity. He noted associated nausea, SOB and dizziness. He also notes increased DOE over the last several months. He gets easily fatigued walking the steps in his home. He denies orthopnea/ PND and LEE. The patient reports that he has been noncompliant with his home medications. He stopped all of his prescribed meds 8 months ago. He states that he did not like the way he felt while on them, noting weakness and fatigue.  In the ED, he was given SL NTG and notes improvement. His pain is now 3/10. EKG shows a RBBB (old) and no acute changes. Initial troponin is negative. CXR is unremarkable.  Past Medical History   Diagnosis  Date   .  Hypertension    .  MI (myocardial infarction)     Past Surgical History   Procedure   Laterality  Date   .  Coronary stent placement     .  Knee surgery      Family History   Problem  Relation  Age of Onset   .  Hypertension  Father     Social History: reports that he has never smoked. He does not have any smokeless tobacco history on file. He reports that he does not drink alcohol or use illicit drugs.  Allergies: No Known Allergies  Prior to Admission medications   Medication  Sig  Start Date  End Date  Taking?  Authorizing Provider   Ascorbic Acid (VITAMIN C CR) 1500 MG TBCR  Take 1 tablet by mouth daily as needed (for vitamin).    Yes  Historical Provider, MD   calcium citrate (CALCITRATE - DOSED IN MG ELEMENTAL CALCIUM) 950 MG tablet  Take 1 tablet by mouth daily as needed (for vitamin).    Yes  Historical Provider, MD   CINNAMON PO  Take 1,000 mg by mouth daily as needed (for vitamin).    Yes  Historical Provider, MD   Coenzyme Q10 (COQ10) 100 MG CAPS  Take 100 mg by mouth daily as needed (for vitamin).    Yes  Historical Provider, MD   Folic Acid-Vit B6-Vit B12 (HOMOCYSTEINE FORMULA) 0.8-50-0.1 MG TABS  Take 1 tablet by mouth daily as needed (for vitamin).    Yes  Historical Provider, MD   L-Lysine 1000 MG TABS  Take 1 tablet by mouth daily as needed (for vitamin).  Yes  Historical Provider, MD   Multiple Vitamin (MULTIVITAMIN WITH MINERALS) TABS tablet  Take 1 tablet by mouth daily.    Yes  Historical Provider, MD   nitroGLYCERIN (NITROSTAT) 0.4 MG SL tablet  Place 0.4 mg under the tongue every 5 (five) minutes as needed for chest pain.    Yes  Historical Provider, MD   OVER THE COUNTER MEDICATION  Take 1 capsule by mouth daily as needed ("Arte-clear" 5mg /35mg  for vitamin).    Yes  Historical Provider, MD   OVER THE COUNTER MEDICATION  Take 1 tablet by mouth daily as needed ("Vasotensin" for vitamin).    Yes  Historical Provider, MD   OVER THE COUNTER MEDICATION  Take 1 tablet by mouth daily as needed ("Cardioblend" for vitamin).    Yes  Historical Provider, MD    Phytosterol Esters (PHYTOSTEROL COMPLEX) 500 MG CAPS  Take 1,000 mg by mouth daily as needed (for vitamin).    Yes  Historical Provider, MD    Results for orders placed during the hospital encounter of 08/07/13 (from the past 48 hour(s))   CBC Status: Abnormal    Collection Time    08/07/13 9:28 AM   Result  Value  Range    WBC  7.2  4.0 - 10.5 K/uL    RBC  5.44  4.22 - 5.81 MIL/uL    Hemoglobin  17.3 (*)  13.0 - 17.0 g/dL    HCT  16.1  09.6 - 04.5 %    MCV  88.2  78.0 - 100.0 fL    MCH  31.8  26.0 - 34.0 pg    MCHC  36.0  30.0 - 36.0 g/dL    RDW  40.9  81.1 - 91.4 %    Platelets  150  150 - 400 K/uL   POCT I-STAT TROPONIN I Status: None    Collection Time    08/07/13 9:40 AM   Result  Value  Range    Troponin i, poc  0.00  0.00 - 0.08 ng/mL    Comment 3      Comment:  Due to the release kinetics of cTnI,     a negative result within the first hours     of the onset of symptoms does not rule out     myocardial infarction with certainty.     If myocardial infarction is still suspected,     repeat the test at appropriate intervals.   POCT I-STAT, CHEM 8 Status: Abnormal    Collection Time    08/07/13 9:42 AM   Result  Value  Range    Sodium  139  135 - 145 mEq/L    Potassium  4.6  3.5 - 5.1 mEq/L    Chloride  105  96 - 112 mEq/L    BUN  14  6 - 23 mg/dL    Creatinine, Ser  7.82  0.50 - 1.35 mg/dL    Glucose, Bld  78  70 - 99 mg/dL    Calcium, Ion  9.56  1.13 - 1.30 mmol/L    TCO2  26  0 - 100 mmol/L    Hemoglobin  17.3 (*)  13.0 - 17.0 g/dL    HCT  21.3  08.6 - 57.8 %   URINALYSIS, ROUTINE W REFLEX MICROSCOPIC Status: None    Collection Time    08/07/13 11:27 AM   Result  Value  Range    Color, Urine  YELLOW  YELLOW    APPearance  CLEAR  CLEAR    Specific Gravity, Urine  1.021  1.005 - 1.030    pH  7.0  5.0 - 8.0    Glucose, UA  NEGATIVE  NEGATIVE mg/dL    Hgb urine dipstick  NEGATIVE  NEGATIVE    Bilirubin Urine  NEGATIVE  NEGATIVE    Ketones, ur  NEGATIVE   NEGATIVE mg/dL    Protein, ur  NEGATIVE  NEGATIVE mg/dL    Urobilinogen, UA  1.0  0.0 - 1.0 mg/dL    Nitrite  NEGATIVE  NEGATIVE    Leukocytes, UA  NEGATIVE  NEGATIVE    Comment:  MICROSCOPIC NOT DONE ON URINES WITH NEGATIVE PROTEIN, BLOOD, LEUKOCYTES, NITRITE, OR GLUCOSE <1000 mg/dL.    Dg Chest 2 View  08/07/2013 *RADIOLOGY REPORT* Clinical Data: Chest pain CHEST - 2 VIEW Comparison: February 05, 2011 Findings: There is no edema or consolidation. Lungs are mildly hyperexpanded. The heart size is within normal limits. There is mild pulmonary venous hypertension. No adenopathy. There is degenerative change in the thoracic spine. IMPRESSION: There is a degree of pulmonary venous hypertension. There is no edema or effusion. No consolidation. Heart size is normal. Original Report Authenticated By: Bretta Bang, M.D.   Review of Systems  Respiratory: Positive for shortness of breath.  Cardiovascular: Positive for chest pain. Negative for orthopnea, leg swelling and PND.  Gastrointestinal: Positive for nausea. Negative for vomiting.  Neurological: Positive for dizziness. Negative for loss of consciousness.  All other systems reviewed and are negative.   Blood pressure 143/96, pulse 54, temperature 98 F (36.7 C), temperature source Oral, resp. rate 10, height 5\' 10"  (1.778 m), weight 235 lb (106.595 kg), SpO2 97.00%.  Physical Exam  Constitutional: He is oriented to person, place, and time. He appears well-developed and well-nourished. No distress.  Eyes: Conjunctivae and EOM are normal. Pupils are equal, round, and reactive to light.  Neck: No JVD present. Carotid bruit is not present.  Cardiovascular: Normal rate, regular rhythm, normal heart sounds and intact distal pulses. Exam reveals no gallop and no friction rub.  No murmur heard.  Pulses:  Radial pulses are 2+ on the right side, and 2+ on the left side.  Dorsalis pedis pulses are 2+ on the right side, and 2+ on the left side.   Respiratory: Effort normal. No respiratory distress. He has no wheezes. He has no rales. He exhibits no tenderness.  GI: Soft. Bowel sounds are normal. He exhibits no distension and no mass. There is no tenderness.  Musculoskeletal: He exhibits no edema.  Neurological: He is alert and oriented to person, place, and time.  Skin: Skin is warm and dry. He is not diaphoretic.  Psychiatric: He has a normal mood and affect. His behavior is normal.   Assessment/Plan  Principal Problem:  Unstable angina  Active Problems:  CAD- s/p PCI w/ DES to circumflex, and RCA in 2012  HTN (hypertension)  HLD (hyperlipidemia)  History of non-ST elevation myocardial infarction (NSTEMI) -2012  H/O noncompliance with medical treatment, presenting hazards to health   Plan: 62 y/o medically noncompliant male with known CAD, s/p NSTEMI in 2012 with subsequent stenting of circumflex and RCA with DES. He has been off all prescribed cardiac meds x 8 months. Other cardiac risk factors includes HTN and HLD. He presents with symptoms concerning for unstable angina. Still with 3/10 chest discomfort, but improved after SL NTG. EKG shows no acute changes. Initial troponin negative. CXR unremarkable. Given his past cardiac history and recent  stop in medications, will plan to admit to observation for MI rule out. Will continue to cycle cardiac enzymes x 3. If he rules out, plan for NST in the AM. If he rules in, will plan for a diagnositic LHC. Since he is still with chest discomfort at rest, will start on IV heparin and IV NTG. Will need to re-initiate cardiac meds, including ASA, a BB and statin. MD to follow.   Allayne Butcher, PA-C  08/07/2013, 1:10 PM  Pt. Seen and examined. Agree with the NP/PA-C note as written. 62 yo male with history of ACS in the past. He had emergent LHC in 2012 which showed at 96% proximal LCX stenosis and 90% proximal RCA stenosis. He had both lesions stented and had a residual 60% stenosis in an  OM1 branch and 40% stenosis of the LAD. He now presents with a few days of dyspnea and 1 day of crescendo chest pain which is very similar, but less intense than 2 years ago. He did have some pain in his left arm. He has not been taking his medication since he ran out - he did not renew his Rx's. He thought that he would try herbal supplements. EKG shows old RBBB and no new ischemic changes - he is still having mild residual pain.   Impression:  1. Good story for unstable angina, known disease, off of medications  2. Known CAD with prior stents in 2012 in the setting of ACS  3. HTN  4. HPL  5. Obesity  6. Medication non-compliance   Recommend:  1. Admit, cycle cardiac enzymes  2. Heparin and nitroglycerin gtts. Re-intiate cardiac medications for BP.  3. Plan LHC +/- PCI on Monday.   Chrystie Nose, MD, [facility]  Attending Cardiologist  [facility]

## 2013-08-07 NOTE — Care Management Note (Signed)
    Page 1 of 1   08/07/2013     2:37:03 PM   CARE MANAGEMENT NOTE 08/07/2013  Patient:  Mario Torres, Mario Torres   Account Number:  [id number]  Date Initiated:  08/07/2013  Documentation initiated by:  Junius Creamer  Subjective/Objective Assessment:   adm w angina     Action/Plan:   lives w wife   Anticipated DC Date:     Anticipated DC Plan:  HOME/SELF CARE      DC Planning Services  CM consult      Choice offered to / List presented to:             Status of service:   Medicare Important Message given?   (If response is "NO", the following Medicare IM given date fields will be blank) Date Medicare IM given:   Date Additional Medicare IM given:    Discharge Disposition:    Per UR Regulation:  Reviewed for med. necessity/level of care/duration of stay  If discussed at Long Length of Stay Meetings, dates discussed:    Comments:

## 2013-08-07 NOTE — ED Notes (Signed)
Pt c/o intermittent CP that started yesterday when he woke up, sts that the is feeling some pressure now and then has some sharp shooting pains here and there. Pt reports he has had an MI in the past and has 2 stents placed. Around February pt stopped taking his daily medications d/c the way they made him feel. Pt sts he hasn't seen his cardiologist in a long time and never told him about stopping his medications. Pt denies similar feeling than past MI, 2 years ago. 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 Denies fever/chills/cough/vomiting.

## 2013-08-07 NOTE — ED Notes (Signed)
Pt given urinal and made aware of need for urine sample. 

## 2013-08-07 NOTE — Progress Notes (Signed)
ANTICOAGULATION CONSULT NOTE - Initial Consult  Pharmacy Consult for Heparin Indication: chest pain/ACS  No Known Allergies  Patient Measurements: Height: 5\' 10"  (177.8 cm) Weight: 235 lb (106.595 kg) IBW/kg (Calculated) : 73 Heparin Dosing Weight: 98 kg  Vital Signs: Temp: 98 F (36.7 C) (08/22 0906) Temp src: Oral (08/22 0906) BP: 169/106 mmHg (08/22 1400) Pulse Rate: 77 (08/22 1400)  Labs:  Recent Labs  08/07/13 0928 08/07/13 0942  HGB 17.3* 17.3*  HCT 48.0 51.0  PLT 150  --   CREATININE  --  1.20    Estimated Creatinine Clearance: 78 ml/min (by C-G formula based on Cr of 1.2).   Medical History: Past Medical History  Diagnosis Date  . Hypertension   . MI (myocardial infarction)     Assessment: 62 year old male with ACS who has been noncompliant with his medications presented to the ED with CP.  Plan is to begin heparin while ruling out MI and determining if cath is needed.  Goal of Therapy:  Heparin level 0.3-0.7 units/ml Monitor platelets by anticoagulation protocol: Yes   Plan:  1) Heparin 4000 units iv bolus x 1 2) Heparin drip at 1350 units / hr 3) Heparin level 6 hours after heparin begins 4) Daily heparin level, CBC  Thank you. Okey Regal, PharmD [phone]  Elwin Sleight 08/07/2013,2:51 PM

## 2013-08-07 NOTE — ED Provider Notes (Signed)
Medical screening examination/treatment/procedure(s) were conducted as a shared visit with non-physician practitioner(s) and myself.  I personally evaluated the patient during the encounter  Patient presents emergent complaints of chest pain radiating to his left arm. The patient has known history of coronary artery disease with myocardial infarction in the past. Patient stopped taking all his medications earlier this year because of side effects. Patient has been having intermittent chest discomfort described as a pressure. The symptoms worsened yesterday and persisted.  He continues to have some pressure in his chest associated with intermittent shortness of breath diaphoresis and nausea.  We'll proceed with cardiac workup in emergency department. His initial EKG does not show an ST elevation MI.  Symptoms are concerning for a non-STEMI/recurrent angina.  We will treat him empirically and consult with his cardiologist.  Celene Kras, MD 08/07/13 [phone]

## 2013-08-07 NOTE — ED Notes (Signed)
Cardiology at bedside.

## 2013-08-07 NOTE — ED Notes (Signed)
Pt c/o CP that radiates to left arm onset yesterday upon awakening. Symptoms with shortness of breath, diaphoresis, nausea, and being lightheaded. Pt tried Nitro yesterday without relief.

## 2013-08-07 NOTE — Consult Note (Signed)
Chief Complaint: Chest Pain  HPI:  The patient is a 62 year old male followed by Dr. Tresa Endo, who, in February 2012, suffered an acute coronary syndrome. Emergent catheterization on February 06, 2011, showed 95% proximal circumflex stenosis as well as 90% RCA stenosis proximally. In addition, he had 60% stenosis of the OM-1 branch of the circumflex and 40% LAD stenosis. A 3.5 x 16 mm DE Promus stent was inserted in the circumflex, post dilated to 4.0 mm acutely. Three days later, he underwent staged intervention to his 90% to 95% proximal RCA stenosis and has been on medical therapy for concomitant CAD.  The patient  underwent a 1-year followup nuclear perfusion study to assess for late restenosis following his acute coronary syndrome. This was done on February 19, 2012, and showed fairly normal perfusion without scar or ischemia. Post stress ejection fraction was 61%. Additional problems include hypertension, hyperlipidemia.   He presented to the [facility] ER with a complaint of chest pain x 1 day. The pain first occurred at rest yesterday morning. Described as substernal chest pressure. The pain has been intermittent. It recurrs and is increased with exertion. He has had some radiation to the left upper extremity. He noted associated nausea, SOB and dizziness. He also notes increased DOE over the last several months. He gets easily fatigued walking the steps in his home. He denies orthopnea/ PND and LEE. The patient reports that he has been noncompliant with his home medications. He stopped all of his prescribed meds 8 months ago. He states that he did not like the way he felt while on them, noting weakness and fatigue.   In the ED, he was given SL NTG and notes improvement. His pain is now 3/10. EKG shows a RBBB (old) and no acute changes. Initial troponin is negative. CXR is unremarkable.   Past Medical History  Diagnosis Date  . Hypertension   . MI (myocardial infarction)     Past Surgical History   Procedure Laterality Date  . Coronary stent placement    . Knee surgery      Family History  Problem Relation Age of Onset  . Hypertension Father    Social History:  reports that he has never smoked. He does not have any smokeless tobacco history on file. He reports that he does not drink alcohol or use illicit drugs.  Allergies: No Known Allergies  Prior to Admission medications   Medication Sig Start Date End Date Taking? Authorizing Provider  Ascorbic Acid (VITAMIN C CR) 1500 MG TBCR Take 1 tablet by mouth daily as needed (for vitamin).    Yes Historical Provider, MD  calcium citrate (CALCITRATE - DOSED IN MG ELEMENTAL CALCIUM) 950 MG tablet Take 1 tablet by mouth daily as needed (for vitamin).   Yes Historical Provider, MD  CINNAMON PO Take 1,000 mg by mouth daily as needed (for vitamin).   Yes Historical Provider, MD  Coenzyme Q10 (COQ10) 100 MG CAPS Take 100 mg by mouth daily as needed (for vitamin).   Yes Historical Provider, MD  Folic Acid-Vit B6-Vit B12 (HOMOCYSTEINE FORMULA) 0.8-50-0.1 MG TABS Take 1 tablet by mouth daily as needed (for vitamin).   Yes Historical Provider, MD  L-Lysine 1000 MG TABS Take 1 tablet by mouth daily as needed (for vitamin).   Yes Historical Provider, MD  Multiple Vitamin (MULTIVITAMIN WITH MINERALS) TABS tablet Take 1 tablet by mouth daily.   Yes Historical Provider, MD  nitroGLYCERIN (NITROSTAT) 0.4 MG SL tablet Place 0.4 mg under the tongue  every 5 (five) minutes as needed for chest pain.   Yes Historical Provider, MD  OVER THE COUNTER MEDICATION Take 1 capsule by mouth daily as needed ("Arte-clear"   5mg /35mg    for vitamin).   Yes Historical Provider, MD  OVER THE COUNTER MEDICATION Take 1 tablet by mouth daily as needed ("Vasotensin"    for vitamin).   Yes Historical Provider, MD  OVER THE COUNTER MEDICATION Take 1 tablet by mouth daily as needed ("Cardioblend"       for vitamin).   Yes Historical Provider, MD  Phytosterol Esters (PHYTOSTEROL  COMPLEX) 500 MG CAPS Take 1,000 mg by mouth daily as needed (for vitamin).   Yes Historical Provider, MD     Results for orders placed during the hospital encounter of 08/07/13 (from the past 48 hour(s))  CBC     Status: Abnormal   Collection Time    08/07/13  9:28 AM      Result Value Range   WBC 7.2  4.0 - 10.5 K/uL   RBC 5.44  4.22 - 5.81 MIL/uL   Hemoglobin 17.3 (*) 13.0 - 17.0 g/dL   HCT 16.1  09.6 - 04.5 %   MCV 88.2  78.0 - 100.0 fL   MCH 31.8  26.0 - 34.0 pg   MCHC 36.0  30.0 - 36.0 g/dL   RDW 40.9  81.1 - 91.4 %   Platelets 150  150 - 400 K/uL  POCT I-STAT TROPONIN I     Status: None   Collection Time    08/07/13  9:40 AM      Result Value Range   Troponin i, poc 0.00  0.00 - 0.08 ng/mL   Comment 3            Comment: Due to the release kinetics of cTnI,     a negative result within the first hours     of the onset of symptoms does not rule out     myocardial infarction with certainty.     If myocardial infarction is still suspected,     repeat the test at appropriate intervals.  POCT I-STAT, CHEM 8     Status: Abnormal   Collection Time    08/07/13  9:42 AM      Result Value Range   Sodium 139  135 - 145 mEq/L   Potassium 4.6  3.5 - 5.1 mEq/L   Chloride 105  96 - 112 mEq/L   BUN 14  6 - 23 mg/dL   Creatinine, Ser 7.82  0.50 - 1.35 mg/dL   Glucose, Bld 78  70 - 99 mg/dL   Calcium, Ion 9.56  2.13 - 1.30 mmol/L   TCO2 26  0 - 100 mmol/L   Hemoglobin 17.3 (*) 13.0 - 17.0 g/dL   HCT 08.6  57.8 - 46.9 %  URINALYSIS, ROUTINE W REFLEX MICROSCOPIC     Status: None   Collection Time    08/07/13 11:27 AM      Result Value Range   Color, Urine YELLOW  YELLOW   APPearance CLEAR  CLEAR   Specific Gravity, Urine 1.021  1.005 - 1.030   pH 7.0  5.0 - 8.0   Glucose, UA NEGATIVE  NEGATIVE mg/dL   Hgb urine dipstick NEGATIVE  NEGATIVE   Bilirubin Urine NEGATIVE  NEGATIVE   Ketones, ur NEGATIVE  NEGATIVE mg/dL   Protein, ur NEGATIVE  NEGATIVE mg/dL   Urobilinogen, UA 1.0   0.0 - 1.0 mg/dL   Nitrite  NEGATIVE  NEGATIVE   Leukocytes, UA NEGATIVE  NEGATIVE   Comment: MICROSCOPIC NOT DONE ON URINES WITH NEGATIVE PROTEIN, BLOOD, LEUKOCYTES, NITRITE, OR GLUCOSE <1000 mg/dL.   Dg Chest 2 View  08/07/2013   *RADIOLOGY REPORT*  Clinical Data: Chest pain  CHEST - 2 VIEW  Comparison:  February 05, 2011  Findings: There is no edema or consolidation.  Lungs are mildly hyperexpanded.  The heart size is within normal limits.  There is mild pulmonary venous hypertension.  No adenopathy.  There is degenerative change in the thoracic spine.  IMPRESSION: There is a degree of pulmonary venous hypertension.  There is no edema or effusion.  No consolidation.  Heart size is normal.   Original Report Authenticated By: Bretta Bang, M.D.    Review of Systems  Respiratory: Positive for shortness of breath.   Cardiovascular: Positive for chest pain. Negative for orthopnea, leg swelling and PND.  Gastrointestinal: Positive for nausea. Negative for vomiting.  Neurological: Positive for dizziness. Negative for loss of consciousness.  All other systems reviewed and are negative.    Blood pressure 143/96, pulse 54, temperature 98 F (36.7 C), temperature source Oral, resp. rate 10, height 5\' 10"  (1.778 m), weight 235 lb (106.595 kg), SpO2 97.00%. Physical Exam  Constitutional: He is oriented to person, place, and time. He appears well-developed and well-nourished. No distress.  Eyes: Conjunctivae and EOM are normal. Pupils are equal, round, and reactive to light.  Neck: No JVD present. Carotid bruit is not present.  Cardiovascular: Normal rate, regular rhythm, normal heart sounds and intact distal pulses.  Exam reveals no gallop and no friction rub.   No murmur heard. Pulses:      Radial pulses are 2+ on the right side, and 2+ on the left side.       Dorsalis pedis pulses are 2+ on the right side, and 2+ on the left side.  Respiratory: Effort normal. No respiratory distress. He has  no wheezes. He has no rales. He exhibits no tenderness.  GI: Soft. Bowel sounds are normal. He exhibits no distension and no mass. There is no tenderness.  Musculoskeletal: He exhibits no edema.  Neurological: He is alert and oriented to person, place, and time.  Skin: Skin is warm and dry. He is not diaphoretic.  Psychiatric: He has a normal mood and affect. His behavior is normal.     Assessment/Plan Principal Problem:   Unstable angina Active Problems:   CAD- s/p PCI w/ DES to circumflex, and RCA in 2012   HTN (hypertension)   HLD (hyperlipidemia)   History of non-ST elevation myocardial infarction (NSTEMI) -2012   H/O noncompliance with medical treatment, presenting hazards to health  Plan: 62 y/o medically noncompliant male with known CAD, s/p NSTEMI in 2012 with subsequent stenting of circumflex and RCA with DES. He has been off all prescribed cardiac meds x 8 months. Other cardiac risk factors includes HTN and HLD. He presents with symptoms concerning for unstable angina. Still with 3/10 chest discomfort, but improved after SL NTG. EKG shows no acute changes. Initial troponin negative. CXR unremarkable. Given his past cardiac history and recent stop in medications, will plan to admit to observation for MI rule out. Will continue to cycle cardiac enzymes x 3. If he rules out, plan for NST in the AM. If he rules in, will plan for a diagnositic LHC. Since he is still with chest discomfort at rest, will start on IV heparin and IV NTG.  Will need to  re-initiate cardiac meds, including ASA, a BB and statin. MD to follow.   Allayne Butcher, PA-C 08/07/2013, 1:10 PM

## 2013-08-07 NOTE — Consult Note (Signed)
Pt. Seen and examined. Agree with the NP/PA-C note as written. 62 yo male with history of ACS in the past. He had emergent LHC in 2012 which showed at 96% proximal LCX stenosis and 90% proximal RCA stenosis. He had both lesions stented and had a residual 60% stenosis in an OM1 branch and 40% stenosis of the LAD. He now presents with a few days of dyspnea and 1 day of crescendo chest pain which is very similar, but less intense than 2 years ago. He did have some pain in his left arm. He has not been taking his medication since he ran out - he did not renew his Rx's. He thought that he would try herbal supplements. EKG shows old RBBB and no new ischemic changes - he is still having mild residual pain.  Impression: 1. Good story for unstable angina, known disease, off of medications 2. Known CAD with prior stents in 2012 in the setting of ACS 3. HTN 4. HPL 5. Obesity 6. Medication non-compliance  Recommend: 1. Admit, cycle cardiac enzymes 2. Heparin and nitroglycerin gtts. Re-intiate cardiac medications for BP. 3. Plan LHC +/- PCI on Monday.  Chrystie Nose, MD, [facility] Attending Cardiologist [facility]

## 2013-08-08 DIAGNOSIS — I209 Angina pectoris, unspecified: Secondary | ICD-10-CM

## 2013-08-08 LAB — BASIC METABOLIC PANEL
CO2: 28 mEq/L (ref 19–32)
Calcium: 9.1 mg/dL (ref 8.4–10.5)
Glucose, Bld: 105 mg/dL — ABNORMAL HIGH (ref 70–99)
Potassium: 4.5 mEq/L (ref 3.5–5.1)
Sodium: 137 mEq/L (ref 135–145)

## 2013-08-08 LAB — CBC
Hemoglobin: 16.2 g/dL (ref 13.0–17.0)
MCH: 32.1 pg (ref 26.0–34.0)
RBC: 5.05 MIL/uL (ref 4.22–5.81)

## 2013-08-08 LAB — LIPID PANEL
HDL: 38 mg/dL — ABNORMAL LOW (ref >39)
LDL Cholesterol: 165 mg/dL — ABNORMAL HIGH (ref 0–99)
Total CHOL/HDL Ratio: 6 RATIO
Triglycerides: 119 mg/dL (ref <150)
VLDL: 24 mg/dL (ref 0–40)

## 2013-08-08 LAB — TROPONIN I: Troponin I: <0.3 ng/mL (ref <0.30)

## 2013-08-08 MED ORDER — PANTOPRAZOLE SODIUM 40 MG PO TBEC
40.0000 mg | DELAYED_RELEASE_TABLET | Freq: Every day | ORAL | Status: DC
  Administered 2013-08-08 – 2013-08-11 (×4): 40 mg via ORAL
  Filled 2013-08-08 (×4): qty 1

## 2013-08-08 MED ORDER — HEPARIN BOLUS VIA INFUSION
4000.0000 [IU] | Freq: Once | INTRAVENOUS | Status: AC
  Administered 2013-08-08: 4000 [IU] via INTRAVENOUS
  Filled 2013-08-08: qty 4000

## 2013-08-08 MED ORDER — ALUM & MAG HYDROXIDE-SIMETH 200-200-20 MG/5ML PO SUSP: 15.0000 mL | ORAL | Status: DC | PRN

## 2013-08-08 MED ORDER — CARVEDILOL 12.5 MG PO TABS
12.5000 mg | ORAL_TABLET | Freq: Two times a day (BID) | ORAL | Status: DC
  Administered 2013-08-08 – 2013-08-10 (×4): 12.5 mg via ORAL
  Filled 2013-08-08 (×6): qty 1

## 2013-08-08 MED ORDER — ATORVASTATIN CALCIUM 80 MG PO TABS
80.0000 mg | ORAL_TABLET | Freq: Every day | ORAL | Status: DC
  Administered 2013-08-08 – 2013-08-10 (×3): 80 mg via ORAL
  Filled 2013-08-08 (×4): qty 1

## 2013-08-08 NOTE — Progress Notes (Addendum)
DAILY PROGRESS NOTE  Subjective:  No further chest pain overnight, but some chest pressure. Cardiac enzymes negative x 3. Complaining of some right arm pain now, no neck pain. Uncomfortable in bed, did not sleep well.  BP was elevated, but improved with hydralazine today.  Objective:  Temp:  [97.8 F (36.6 C)-98.1 F (36.7 C)] 97.8 F (36.6 C) (08/23 1610) Pulse Rate:  [53-90] 72 (08/23 0910) Resp:  [8-25] 8 (08/23 0910) BP: (121-194)/(64-125) 121/64 mmHg (08/23 0910) SpO2:  [90 %-97 %] 93 % (08/23 0910) Weight:  [236 lb 15.9 oz (107.5 kg)] 236 lb 15.9 oz (107.5 kg) (08/23 0643) Weight change:   Intake/Output from previous day: 08/22 0701 - 08/23 0700 In: 1071.9 [P.O.:600; I.V.:471.9] Out: 1450 [Urine:1450]  Intake/Output from this shift: Total I/O In: 34 [I.V.:34] Out: -   Medications: Current Facility-Administered Medications  Medication Dose Route Frequency Provider Last Rate Last Dose  . 0.9 %  sodium chloride infusion  1,000 mL Intravenous Continuous Fayrene Helper, PA-C   1,000 mL at 08/07/13 1035  . 0.9 %  sodium chloride infusion  250 mL Intravenous PRN Brittainy Simmons, PA-C      . [START ON 08/09/2013] 0.9 %  sodium chloride infusion   Intravenous Continuous Brittainy Simmons, PA-C      . 0.9 %  sodium chloride infusion   Intravenous Continuous Marykay Lex, MD 10 mL/hr at 08/07/13 2000    . acetaminophen (TYLENOL) tablet 650 mg  650 mg Oral Q4H PRN Brittainy Simmons, PA-C   650 mg at 08/08/13 0214  . ALPRAZolam Prudy Feeler) tablet 0.25 mg  0.25 mg Oral BID PRN Brittainy Simmons, PA-C      . alum & mag hydroxide-simeth (MAALOX/MYLANTA) 200-200-20 MG/5ML suspension 15 mL  15 mL Oral PRN Marykay Lex, MD      . Melene Muller ON 08/10/2013] aspirin chewable tablet 324 mg  324 mg Oral Pre-Cath Brittainy Simmons, PA-C      . aspirin EC tablet 81 mg  81 mg Oral Daily Brittainy Simmons, PA-C      . atorvastatin (LIPITOR) tablet 20 mg  20 mg Oral q1800 Brittainy Simmons, PA-C   20 mg  at 08/07/13 1713  . carvedilol (COREG) tablet 6.25 mg  6.25 mg Oral BID WC Brittainy Simmons, PA-C   6.25 mg at 08/08/13 0800  . heparin ADULT infusion 100 units/mL (25000 units/250 mL)  1,650 Units/hr Intravenous Continuous Herby Abraham, RPH 16.5 mL/hr at 08/08/13 0011 1,650 Units/hr at 08/08/13 0011  . hydrALAZINE (APRESOLINE) injection 10 mg  10 mg Intravenous Q4H PRN Brittainy Simmons, PA-C   10 mg at 08/08/13 0857  . morphine 2 MG/ML injection 2 mg  2 mg Intravenous Q4H PRN Brittainy Simmons, PA-C   2 mg at 08/08/13 0649  . nitroGLYCERIN (NITROSTAT) SL tablet 0.4 mg  0.4 mg Sublingual Q5 min PRN Brittainy Simmons, PA-C      . nitroGLYCERIN 0.2 mg/mL in dextrose 5 % infusion  3-30 mcg/min Intravenous Titrated Brittainy Simmons, PA-C 4.5 mL/hr at 08/07/13 2000 15 mcg/min at 08/07/13 2000  . ondansetron (ZOFRAN) injection 4 mg  4 mg Intravenous Q6H PRN Brittainy Simmons, PA-C   4 mg at 08/08/13 0900  . sodium chloride 0.9 % injection 3 mL  3 mL Intravenous Q12H Brittainy Simmons, PA-C      . sodium chloride 0.9 % injection 3 mL  3 mL Intravenous PRN Brittainy Simmons, PA-C      . zolpidem (AMBIEN) tablet 5 mg  5 mg  Oral QHS PRN,MR X 1 Brittainy Simmons, PA-C   5 mg at 08/07/13 2149    Physical Exam: General appearance: alert and no distress Neck: no adenopathy, no carotid bruit, no JVD, supple, symmetrical, trachea midline and thyroid not enlarged, symmetric, no tenderness/mass/nodules Lungs: clear to auscultation bilaterally Heart: regular rate and rhythm, S1, S2 normal, no murmur, click, rub or gallop Abdomen: soft, non-tender; bowel sounds normal; no masses,  no organomegaly Extremities: extremities normal, atraumatic, no cyanosis or edema Pulses: 2+ and symmetric Skin: Skin color, texture, turgor normal. No rashes or lesions Neurologic: Grossly normal  Lab Results: Results for orders placed during the hospital encounter of 08/07/13 (from the past 48 hour(s))  CBC     Status:  Abnormal   Collection Time    08/07/13  9:28 AM      Result Value Range   WBC 7.2  4.0 - 10.5 K/uL   RBC 5.44  4.22 - 5.81 MIL/uL   Hemoglobin 17.3 (*) 13.0 - 17.0 g/dL   HCT 11.9  14.7 - 82.9 %   MCV 88.2  78.0 - 100.0 fL   MCH 31.8  26.0 - 34.0 pg   MCHC 36.0  30.0 - 36.0 g/dL   RDW 56.2  13.0 - 86.5 %   Platelets 150  150 - 400 K/uL  POCT I-STAT TROPONIN I     Status: None   Collection Time    08/07/13  9:40 AM      Result Value Range   Troponin i, poc 0.00  0.00 - 0.08 ng/mL   Comment 3            Comment: Due to the release kinetics of cTnI,     a negative result within the first hours     of the onset of symptoms does not rule out     myocardial infarction with certainty.     If myocardial infarction is still suspected,     repeat the test at appropriate intervals.  POCT I-STAT, CHEM 8     Status: Abnormal   Collection Time    08/07/13  9:42 AM      Result Value Range   Sodium 139  135 - 145 mEq/L   Potassium 4.6  3.5 - 5.1 mEq/L   Chloride 105  96 - 112 mEq/L   BUN 14  6 - 23 mg/dL   Creatinine, Ser 7.84  0.50 - 1.35 mg/dL   Glucose, Bld 78  70 - 99 mg/dL   Calcium, Ion 6.96  2.95 - 1.30 mmol/L   TCO2 26  0 - 100 mmol/L   Hemoglobin 17.3 (*) 13.0 - 17.0 g/dL   HCT 28.4  13.2 - 44.0 %  URINALYSIS, ROUTINE W REFLEX MICROSCOPIC     Status: None   Collection Time    08/07/13 11:27 AM      Result Value Range   Color, Urine YELLOW  YELLOW   APPearance CLEAR  CLEAR   Specific Gravity, Urine 1.021  1.005 - 1.030   pH 7.0  5.0 - 8.0   Glucose, UA NEGATIVE  NEGATIVE mg/dL   Hgb urine dipstick NEGATIVE  NEGATIVE   Bilirubin Urine NEGATIVE  NEGATIVE   Ketones, ur NEGATIVE  NEGATIVE mg/dL   Protein, ur NEGATIVE  NEGATIVE mg/dL   Urobilinogen, UA 1.0  0.0 - 1.0 mg/dL   Nitrite NEGATIVE  NEGATIVE   Leukocytes, UA NEGATIVE  NEGATIVE   Comment: MICROSCOPIC NOT DONE ON URINES WITH NEGATIVE PROTEIN,  BLOOD, LEUKOCYTES, NITRITE, OR GLUCOSE <1000 mg/dL.  MRSA PCR SCREENING      Status: None   Collection Time    08/07/13  2:44 PM      Result Value Range   MRSA by PCR NEGATIVE  NEGATIVE   Comment:            The GeneXpert MRSA Assay (FDA     approved for NASAL specimens     only), is one component of a     comprehensive MRSA colonization     surveillance program. It is not     intended to diagnose MRSA     infection nor to guide or     monitor treatment for     MRSA infections.  HEMOGLOBIN A1C     Status: None   Collection Time    08/07/13  2:52 PM      Result Value Range   Hemoglobin A1C 5.4  <5.7 %   Comment: (NOTE)                                                                               According to the ADA Clinical Practice Recommendations for 2011, when     HbA1c is used as a screening test:      >=6.5%   Diagnostic of Diabetes Mellitus               (if abnormal result is confirmed)     5.7-6.4%   Increased risk of developing Diabetes Mellitus     References:Diagnosis and Classification of Diabetes Mellitus,Diabetes     Care,2011,34(Suppl 1):S62-S69 and Standards of Medical Care in             Diabetes - 2011,Diabetes Care,2011,34 (Suppl 1):S11-S61.   Mean Plasma Glucose 108  <117 mg/dL   Comment: Performed at [facility]  TROPONIN I     Status: None   Collection Time    08/07/13  3:01 PM      Result Value Range   Troponin I <0.30  <0.30 ng/mL   Comment:            Due to the release kinetics of cTnI,     a negative result within the first hours     of the onset of symptoms does not rule out     myocardial infarction with certainty.     If myocardial infarction is still suspected,     repeat the test at appropriate intervals.  LACTATE DEHYDROGENASE     Status: None   Collection Time    08/07/13  3:01 PM      Result Value Range   LDH 196  94 - 250 U/L  TROPONIN I     Status: None   Collection Time    08/07/13  9:15 PM      Result Value Range   Troponin I <0.30  <0.30 ng/mL   Comment:            Due to the release  kinetics of cTnI,     a negative result within the first hours     of the onset of symptoms does not rule out  myocardial infarction with certainty.     If myocardial infarction is still suspected,     repeat the test at appropriate intervals.  HEPARIN LEVEL (UNFRACTIONATED)     Status: Abnormal   Collection Time    08/07/13 10:08 PM      Result Value Range   Heparin Unfractionated <0.10 (*) 0.30 - 0.70 IU/mL   Comment:            IF HEPARIN RESULTS ARE BELOW     EXPECTED VALUES, AND PATIENT     DOSAGE HAS BEEN CONFIRMED,     SUGGEST FOLLOW UP TESTING     OF ANTITHROMBIN III LEVELS.  TROPONIN I     Status: None   Collection Time    08/08/13  2:20 AM      Result Value Range   Troponin I <0.30  <0.30 ng/mL   Comment:            Due to the release kinetics of cTnI,     a negative result within the first hours     of the onset of symptoms does not rule out     myocardial infarction with certainty.     If myocardial infarction is still suspected,     repeat the test at appropriate intervals.  CBC     Status: Abnormal   Collection Time    08/08/13  2:20 AM      Result Value Range   WBC 7.0  4.0 - 10.5 K/uL   RBC 5.05  4.22 - 5.81 MIL/uL   Hemoglobin 16.2  13.0 - 17.0 g/dL   HCT 16.1  09.6 - 04.5 %   MCV 89.1  78.0 - 100.0 fL   MCH 32.1  26.0 - 34.0 pg   MCHC 36.0  30.0 - 36.0 g/dL   RDW 40.9  81.1 - 91.4 %   Platelets 132 (*) 150 - 400 K/uL  BASIC METABOLIC PANEL     Status: Abnormal   Collection Time    08/08/13  2:20 AM      Result Value Range   Sodium 137  135 - 145 mEq/L   Potassium 4.5  3.5 - 5.1 mEq/L   Chloride 102  96 - 112 mEq/L   CO2 28  19 - 32 mEq/L   Glucose, Bld 105 (*) 70 - 99 mg/dL   BUN 13  6 - 23 mg/dL   Creatinine, Ser 7.82  0.50 - 1.35 mg/dL   Calcium 9.1  8.4 - 95.6 mg/dL   GFR calc non Af Amer 87 (*) >90 mL/min   GFR calc Af Amer >90  >90 mL/min   Comment: (NOTE)     The eGFR has been calculated using the CKD EPI equation.     This  calculation has not been validated in all clinical situations.     eGFR's persistently <90 mL/min signify possible Chronic Kidney     Disease.  LIPID PANEL     Status: Abnormal   Collection Time    08/08/13  2:20 AM      Result Value Range   Cholesterol 227 (*) 0 - 200 mg/dL   Triglycerides 213  <086 mg/dL   HDL 38 (*) >57 mg/dL   Total CHOL/HDL Ratio 6.0     VLDL 24  0 - 40 mg/dL   LDL Cholesterol 846 (*) 0 - 99 mg/dL   Comment:            Total Cholesterol/HDL:CHD Risk  Coronary Heart Disease Risk Table                         Men   Women      1/2 Average Risk   3.4   3.3      Average Risk       5.0   4.4      2 X Average Risk   9.6   7.1      3 X Average Risk  23.4   11.0                Use the calculated Patient Ratio     above and the CHD Risk Table     to determine the patient's CHD Risk.                ATP III CLASSIFICATION (LDL):      <100     mg/dL   Optimal      829-562  mg/dL   Near or Above                        Optimal      130-159  mg/dL   Borderline      130-865  mg/dL   High      >784     mg/dL   Very High  HEPARIN LEVEL (UNFRACTIONATED)     Status: None   Collection Time    08/08/13  7:27 AM      Result Value Range   Heparin Unfractionated 0.60  0.30 - 0.70 IU/mL   Comment:            IF HEPARIN RESULTS ARE BELOW     EXPECTED VALUES, AND PATIENT     DOSAGE HAS BEEN CONFIRMED,     SUGGEST FOLLOW UP TESTING     OF ANTITHROMBIN III LEVELS.    Imaging: Dg Chest 2 View  08/07/2013   *RADIOLOGY REPORT*  Clinical Data: Chest pain  CHEST - 2 VIEW  Comparison:  February 05, 2011  Findings: There is no edema or consolidation.  Lungs are mildly hyperexpanded.  The heart size is within normal limits.  There is mild pulmonary venous hypertension.  No adenopathy.  There is degenerative change in the thoracic spine.  IMPRESSION: There is a degree of pulmonary venous hypertension.  There is no edema or effusion.  No consolidation.  Heart size is normal.    Original Report Authenticated By: Bretta Bang, M.D.    Assessment:  1. Principal Problem: 2.   Unstable angina 3. Active Problems: 4.   CAD- s/p PCI w/ DES to circumflex, and RCA in 2012 5.   HTN (hypertension) 6.   HLD (hyperlipidemia) 7.   History of non-ST elevation myocardial infarction (NSTEMI) -2012 8.   H/O noncompliance with medical treatment, presenting hazards to health 9.   Plan:  1. Doing well without complaints. LDL cholesterol 165, will increase atorvastatin to 80 mg daily. Will need additional blood pressure medication, increase coreg to 12.5 mg BID - hydralazine IV prn. Some of the symptoms worse when lying down, ?reflux - will add PPI trial while in hospital. Plan for [facility] on Monday.  Time Spent Directly with Patient:  15 minutes  Length of Stay:  LOS: 1 day   Chrystie Nose, MD, [facility] Attending Cardiologist [facility]  HILTY,Kenneth C 08/08/2013, 9:31 AM

## 2013-08-08 NOTE — Progress Notes (Signed)
ANTICOAGULATION CONSULT NOTE -follow up Pharmacy Consult for Heparin Indication: chest pain/ACS  No Known Allergies  Patient Measurements: Height: 5\' 10"  (177.8 cm) Weight: 236 lb 15.9 oz (107.5 kg) IBW/kg (Calculated) : 73 Heparin Dosing Weight: 98 kg  Vital Signs: Temp: 97.8 F (36.6 C) (08/23 0632) Temp src: Oral (08/23 0000) BP: 161/111 mmHg (08/23 0800) Pulse Rate: 72 (08/23 0800)  Labs:  Recent Labs  08/07/13 0928 08/07/13 0942 08/07/13 1501 08/07/13 2115 08/07/13 2208 08/08/13 0220 08/08/13 0727  HGB 17.3* 17.3*  --   --   --  16.2  --   HCT 48.0 51.0  --   --   --  45.0  --   PLT 150  --   --   --   --  132*  --   HEPARINUNFRC  --   --   --   --  <0.10*  --  0.60  CREATININE  --  1.20  --   --   --  0.97  --   TROPONINI  --   --  <0.30 <0.30  --  <0.30  --     Estimated Creatinine Clearance: 96.9 ml/min (by C-G formula based on Cr of 0.97).  Assessment: 62 year old male with ACS who has been noncompliant with his medications presented to the ED with CP.  Heparin is now at goal (HL= 0.6) on 1650 units/hr. Noted plans for cath on Monday.  Goal of Therapy:  Heparin level 0.3-0.7 units/ml Monitor platelets by anticoagulation protocol: Yes   Plan:  -No heparin changed needed -Daily heparin level and CBC  Harland German, Pharm D 08/08/2013 9:09 AM

## 2013-08-08 NOTE — Progress Notes (Signed)
ANTICOAGULATION CONSULT NOTE -follow up Pharmacy Consult for Heparin Indication: chest pain/ACS  No Known Allergies  Patient Measurements: Height: 5\' 10"  (177.8 cm) Weight: 236 lb 15.9 oz (107.5 kg) IBW/kg (Calculated) : 73 Heparin Dosing Weight: 98 kg  Vital Signs: Temp: 97.5 F (36.4 C) (08/23 1615) Temp src: Oral (08/23 1615) BP: 145/82 mmHg (08/23 1615) Pulse Rate: 62 (08/23 1615)  Labs:  Recent Labs  08/07/13 0928 08/07/13 0942 08/07/13 1501 08/07/13 2115 08/07/13 2208 08/08/13 0220 08/08/13 0727 08/08/13 1600  HGB 17.3* 17.3*  --   --   --  16.2  --   --   HCT 48.0 51.0  --   --   --  45.0  --   --   PLT 150  --   --   --   --  132*  --   --   HEPARINUNFRC  --   --   --   --  <0.10*  --  0.60 0.48  CREATININE  --  1.20  --   --   --  0.97  --   --   TROPONINI  --   --  <0.30 <0.30  --  <0.30  --   --     Estimated Creatinine Clearance: 96.9 ml/min (by C-G formula based on Cr of 0.97).  Assessment: 62 year old male with ACS who has been noncompliant with his medications presented to the ED with CP.  Heparin is now at goal x2 with most recent HL of 0.48. Heparin running at 1650 units/hr. CBC stable, no bleeding noted.  Goal of Therapy:  Heparin level 0.3-0.7 units/ml Monitor platelets by anticoagulation protocol: Yes   Plan:  1. Continue heparin at 1650 units/hr 2. Daily heparin level and CBC  Inesha Sow D. Paighton Godette, PharmD Clinical Pharmacist Pager: [phone] 08/08/2013 4:39 PM

## 2013-08-08 NOTE — Progress Notes (Signed)
ANTICOAGULATION CONSULT NOTE -follow up Pharmacy Consult for Heparin Indication: chest pain/ACS  No Known Allergies  Patient Measurements: Height: 5\' 10"  (177.8 cm) Weight: 236 lb 15.9 oz (107.5 kg) IBW/kg (Calculated) : 73 Heparin Dosing Weight: 98 kg  Vital Signs: Temp: 97.8 F (36.6 C) (08/22 2000) Temp src: Oral (08/22 2000) BP: 149/89 mmHg (08/22 2200) Pulse Rate: 62 (08/22 2200)  Labs:  Recent Labs  08/07/13 0928 08/07/13 0942 08/07/13 1501 08/07/13 2115 08/07/13 2208  HGB 17.3* 17.3*  --   --   --   HCT 48.0 51.0  --   --   --   PLT 150  --   --   --   --   HEPARINUNFRC  --   --   --   --  <0.10*  CREATININE  --  1.20  --   --   --   TROPONINI  --   --  <0.30 <0.30  --     Estimated Creatinine Clearance: 78.4 ml/min (by C-G formula based on Cr of 1.2).  Assessment: 62 year old male with ACS who has been noncompliant with his medications presented to the ED with CP.  Plan is to begin heparin while ruling out MI and cath lab on Monday.  His first HL 7 hours after a 4000 units bolus and drip at 1350 units/hr is undetectable.  Per RN his heparin drip is infusing well.   Troponin negative x 2.  No bleeding reported.   Goal of Therapy:  Heparin level 0.3-0.7 units/ml Monitor platelets by anticoagulation protocol: Yes   Plan:  1) repeat Heparin 4000 units iv bolus x 1 2) increase heparin drip to 1650 units / hr 3) Heparin level 6 hours after re-bolus 4) Daily heparin level, CBC Herby Abraham, Pharm.D. [phone] 08/08/2013 12:08 AM

## 2013-08-09 DIAGNOSIS — K219 Gastro-esophageal reflux disease without esophagitis: Secondary | ICD-10-CM | POA: Diagnosis present

## 2013-08-09 LAB — CBC
MCH: 32.1 pg (ref 26.0–34.0)
MCHC: 36.1 g/dL — ABNORMAL HIGH (ref 30.0–36.0)
MCV: 88.9 fL (ref 78.0–100.0)
Platelets: 135 10*3/uL — ABNORMAL LOW (ref 150–400)
RDW: 13.1 % (ref 11.5–15.5)

## 2013-08-09 LAB — BASIC METABOLIC PANEL
Calcium: 9.5 mg/dL (ref 8.4–10.5)
Creatinine, Ser: 1.05 mg/dL (ref 0.50–1.35)
GFR calc Af Amer: 86 mL/min — ABNORMAL LOW (ref >90)

## 2013-08-09 LAB — HEPARIN LEVEL (UNFRACTIONATED): Heparin Unfractionated: 0.62 IU/mL (ref 0.30–0.70)

## 2013-08-09 MED ORDER — IRBESARTAN 75 MG PO TABS
75.0000 mg | ORAL_TABLET | Freq: Every day | ORAL | Status: DC
  Administered 2013-08-09 – 2013-08-10 (×2): 75 mg via ORAL
  Filled 2013-08-09 (×3): qty 1

## 2013-08-09 NOTE — Progress Notes (Signed)
ANTICOAGULATION CONSULT NOTE -follow up Pharmacy Consult for Heparin Indication: chest pain/ACS  No Known Allergies  Patient Measurements: Height: 5\' 10"  (177.8 cm) Weight: 236 lb 15.9 oz (107.5 kg) IBW/kg (Calculated) : 73 Heparin Dosing Weight: 98 kg  Vital Signs: Temp: 98.1 F (36.7 C) (08/24 0754) Temp src: Oral (08/24 0754) BP: 161/74 mmHg (08/24 0900) Pulse Rate: 66 (08/24 0754)  Labs:  Recent Labs  08/07/13 0928 08/07/13 0942 08/07/13 1501 08/07/13 2115  08/08/13 0220 08/08/13 0727 08/08/13 1600 08/09/13 0510  HGB 17.3* 17.3*  --   --   --  16.2  --   --  15.0  HCT 48.0 51.0  --   --   --  45.0  --   --  41.6  PLT 150  --   --   --   --  132*  --   --  135*  HEPARINUNFRC  --   --   --   --   < >  --  0.60 0.48 0.62  CREATININE  --  1.20  --   --   --  0.97  --   --  1.05  TROPONINI  --   --  <0.30 <0.30  --  <0.30  --   --   --   < > = values in this interval not displayed.  Estimated Creatinine Clearance: 89.6 ml/min (by C-G formula based on Cr of 1.05).  Assessment: 62 year old male with ACS who has been noncompliant with his medications presented to the ED with CP.  Heparin is goal (HL=0.62) at 1650 units/hr. CBC stable and patient noted for cath in am.  Goal of Therapy:  Heparin level 0.3-0.7 units/ml Monitor platelets by anticoagulation protocol: Yes   Plan:  1. Continue heparin at 1650 units/hr 2. Daily heparin level and CBC  Harland German, Pharm D 08/09/2013 9:32 AM

## 2013-08-09 NOTE — Progress Notes (Addendum)
Pt. Seen and examined. Agree with the NP/PA-C note as written.  Mild CP episode, but now CP free. Feels general malaise, no appetite - blaming the medications, especially statin. Now on PPI. Increased dose b-blocker, however, BP still not at goal. He reports no energy. Headaches, wean nitro gtts to off and give prn SL nitro for chest pain. Plan LHC in am tomorrow.  Chrystie Nose, MD, [facility] Attending Cardiologist [facility]

## 2013-08-09 NOTE — Progress Notes (Signed)
C/O feeling "unwell ". SEE 12-LEAD .NTG not given due to s/sx subsiding in 2 min .

## 2013-08-09 NOTE — Progress Notes (Signed)
Pt c/o mild nausea Also, he has additional complaints of generalized malaise,fatigue and feeling weak----"the way I felt at home ... before I stopped taking my meds."

## 2013-08-09 NOTE — Progress Notes (Signed)
[facility]  Subjective: Pt had 1 episode of mild chest discomfort this AM that has resolved. Currently CP free. No other complaints.   Objective: Vital signs in last 24 hours: Temp:  [97.5 F (36.4 C)-98.1 F (36.7 C)] 98.1 F (36.7 C) (08/24 0734) Pulse Rate:  [57-77] 64 (08/24 0500) Resp:  [7-19] 18 (08/24 0500) BP: (118-161)/(63-111) 139/87 mmHg (08/24 0734) SpO2:  [92 %-99 %] 93 % (08/24 0734) Last BM Date: 08/07/13  Intake/Output from previous day: 08/23 0701 - 08/24 0700 In: 1501 [P.O.:600; I.V.:901] Out: 450 [Urine:450] Intake/Output this shift:    Medications Current Facility-Administered Medications  Medication Dose Route Frequency Provider Last Rate Last Dose  . 0.9 %  sodium chloride infusion  1,000 mL Intravenous Continuous Fayrene Helper, PA-C   1,000 mL at 08/07/13 1035  . 0.9 %  sodium chloride infusion  250 mL Intravenous PRN Herminio Kniskern, PA-C      . 0.9 %  sodium chloride infusion   Intravenous Continuous Tesean Stump, PA-C      . 0.9 %  sodium chloride infusion   Intravenous Continuous Marykay Lex, MD 10 mL/hr at 08/08/13 1700    . acetaminophen (TYLENOL) tablet 650 mg  650 mg Oral Q4H PRN Jamielee Mchale, PA-C   650 mg at 08/09/13 0600  . ALPRAZolam Prudy Feeler) tablet 0.25 mg  0.25 mg Oral BID PRN Robbie Lis, PA-C   0.25 mg at 08/08/13 2044  . alum & mag hydroxide-simeth (MAALOX/MYLANTA) 200-200-20 MG/5ML suspension 15 mL  15 mL Oral PRN Marykay Lex, MD      . Melene Muller ON 08/10/2013] aspirin chewable tablet 324 mg  324 mg Oral Pre-Cath Orestes Geiman, PA-C      . aspirin EC tablet 81 mg  81 mg Oral Daily Pat Sires, PA-C   81 mg at 08/08/13 1044  . atorvastatin (LIPITOR) tablet 80 mg  80 mg Oral q1800 Chrystie Nose, MD   80 mg at 08/08/13 1717  . carvedilol (COREG) tablet 12.5 mg  12.5 mg Oral BID WC Chrystie Nose, MD   12.5 mg at 08/08/13 1628  . heparin ADULT infusion 100 units/mL (25000  units/250 mL)  1,650 Units/hr Intravenous Continuous Herby Abraham, RPH 16.5 mL/hr at 08/08/13 2205 1,650 Units/hr at 08/08/13 2205  . hydrALAZINE (APRESOLINE) injection 10 mg  10 mg Intravenous Q4H PRN Daimian Sudberry, PA-C   10 mg at 08/08/13 0857  . morphine 2 MG/ML injection 2 mg  2 mg Intravenous Q4H PRN Robbie Lis, PA-C   2 mg at 08/09/13 0114  . nitroGLYCERIN (NITROSTAT) SL tablet 0.4 mg  0.4 mg Sublingual Q5 min PRN Timber Marshman, PA-C      . nitroGLYCERIN 0.2 mg/mL in dextrose 5 % infusion  3-30 mcg/min Intravenous Titrated Reyah Streeter, PA-C 9 mL/hr at 08/08/13 2100 30 mcg/min at 08/08/13 2100  . ondansetron (ZOFRAN) injection 4 mg  4 mg Intravenous Q6H PRN Kalaya Infantino, PA-C   4 mg at 08/08/13 2015  . pantoprazole (PROTONIX) EC tablet 40 mg  40 mg Oral Daily Chrystie Nose, MD   40 mg at 08/08/13 1044  . sodium chloride 0.9 % injection 3 mL  3 mL Intravenous Q12H Tea Collums, PA-C   3 mL at 08/08/13 1045  . sodium chloride 0.9 % injection 3 mL  3 mL Intravenous PRN Maximiano Lott, PA-C      . zolpidem (AMBIEN) tablet 5 mg  5 mg Oral QHS PRN,MR X  1 Shakera Ebrahimi, PA-C   5 mg at 08/08/13 2044    PE: General appearance: alert, cooperative and no distress Lungs: clear to auscultation bilaterally Heart: regular rate and rhythm, S1, S2 normal, no murmur, click, rub or gallop Extremities: no LEE Pulses: 2+ and symmetric Skin: warm and dry Neurologic: Grossly normal  Lab Results:   Recent Labs  08/07/13 0928 08/07/13 0942 08/08/13 0220 08/09/13 0510  WBC 7.2  --  7.0 8.2  HGB 17.3* 17.3* 16.2 15.0  HCT 48.0 51.0 45.0 41.6  PLT 150  --  132* 135*   BMET  Recent Labs  08/07/13 0942 08/08/13 0220 08/09/13 0510  NA 139 137 137  K 4.6 4.5 4.1  CL 105 102 103  CO2  --  28 27  GLUCOSE 78 105* 99  BUN 14 13 10   CREATININE 1.20 0.97 1.05  CALCIUM  --  9.1 9.5   PT/INR No results found for this basename: LABPROT, INR,  in the last 72  hours Cholesterol  Recent Labs  08/08/13 0220  CHOL 227*   Cardiac Panel (last 3 results)  Recent Labs  08/07/13 1501 08/07/13 2115 08/08/13 0220  TROPONINI <0.30 <0.30 <0.30    Assessment/Plan  Principal Problem:   Unstable angina Active Problems:   CAD- s/p PCI w/ DES to circumflex, and RCA in 2012   HTN (hypertension)   HLD (hyperlipidemia)   History of non-ST elevation myocardial infarction (NSTEMI) -2012   H/O noncompliance with medical treatment, presenting hazards to health  Plan: Mild chest discomfort earlier this am that has resolved. Currently CP free. AM EKG not completed yet. Continue on IV heparin and IV nitro. Plan for diagnostic LHC +/- PCI tomorrow. Pre-cath orders have been placed. He will be NPO at midnight. He has better BP control with increased dose of BB. Continue on 12.5 mg of Coreg BID. HR is stable. Continue with ASA and statin.     LOS: 2 days    Camarion Weier M. Delmer Islam 08/09/2013 7:47 AM

## 2013-08-09 NOTE — Progress Notes (Signed)
Refused dinner meal ," I don't feel hungry." Pt sitting upright in bed, laughing w/visitors and spouse at bedside .

## 2013-08-10 ENCOUNTER — Encounter (HOSPITAL_COMMUNITY)
Admission: EM | Disposition: A | Discharge status: Home / Self Care | Payer: Self-pay | Source: Home / Self Care | Attending: Cardiology

## 2013-08-10 ENCOUNTER — Ambulatory Visit (HOSPITAL_COMMUNITY): Admission: RE | Admit: 2013-08-10 | Payer: 59 | Source: Ambulatory Visit | Admitting: Cardiovascular Disease

## 2013-08-10 DIAGNOSIS — R079 Chest pain, unspecified: Secondary | ICD-10-CM

## 2013-08-10 HISTORY — PX: LEFT HEART CATHETERIZATION WITH CORONARY ANGIOGRAM: SHX5451

## 2013-08-10 LAB — BASIC METABOLIC PANEL
BUN: 9 mg/dL (ref 6–23)
Calcium: 9.2 mg/dL (ref 8.4–10.5)
GFR calc Af Amer: 89 mL/min — ABNORMAL LOW (ref >90)
GFR calc non Af Amer: 77 mL/min — ABNORMAL LOW (ref >90)
Potassium: 4 mEq/L (ref 3.5–5.1)
Sodium: 139 mEq/L (ref 135–145)

## 2013-08-10 LAB — CBC
HCT: 46.1 % (ref 39.0–52.0)
MCH: 30.8 pg (ref 26.0–34.0)
MCHC: 34.1 g/dL (ref 30.0–36.0)
RDW: 13.4 % (ref 11.5–15.5)

## 2013-08-10 LAB — POCT ACTIVATED CLOTTING TIME: Activated Clotting Time: 140 seconds

## 2013-08-10 LAB — HEPARIN LEVEL (UNFRACTIONATED): Heparin Unfractionated: 0.76 IU/mL — ABNORMAL HIGH (ref 0.30–0.70)

## 2013-08-10 SURGERY — LEFT HEART CATHETERIZATION WITH CORONARY ANGIOGRAM
Anesthesia: LOCAL

## 2013-08-10 MED ORDER — MIDAZOLAM HCL 2 MG/2ML IJ SOLN
INTRAMUSCULAR | Status: AC
  Filled 2013-08-10: qty 2

## 2013-08-10 MED ORDER — ONDANSETRON HCL 4 MG/2ML IJ SOLN: 4.0000 mg | Freq: Four times a day (QID) | INTRAMUSCULAR | Status: DC | PRN

## 2013-08-10 MED ORDER — NITROGLYCERIN IN D5W 200-5 MCG/ML-% IV SOLN
3.0000 ug/min | INTRAVENOUS | Status: DC
  Administered 2013-08-10: 5 ug/min via INTRAVENOUS

## 2013-08-10 MED ORDER — SODIUM CHLORIDE 0.9 % IV SOLN: INTRAVENOUS | Status: DC

## 2013-08-10 MED ORDER — HEPARIN (PORCINE) IN NACL 100-0.45 UNIT/ML-% IJ SOLN
1550.0000 [IU]/h | INTRAMUSCULAR | Status: DC
  Administered 2013-08-10: 1550 [IU]/h via INTRAVENOUS
  Filled 2013-08-10: qty 250

## 2013-08-10 MED ORDER — FENTANYL CITRATE 0.05 MG/ML IJ SOLN
INTRAMUSCULAR | Status: AC
  Filled 2013-08-10: qty 2

## 2013-08-10 MED ORDER — NITROGLYCERIN 0.2 MG/ML ON CALL CATH LAB
INTRAVENOUS | Status: AC
  Filled 2013-08-10: qty 1

## 2013-08-10 MED ORDER — LIDOCAINE HCL (PF) 1 % IJ SOLN
INTRAMUSCULAR | Status: AC
  Filled 2013-08-10: qty 30

## 2013-08-10 MED ORDER — ACETAMINOPHEN 325 MG PO TABS: 650.0000 mg | ORAL_TABLET | ORAL | Status: DC | PRN

## 2013-08-10 MED ORDER — ASPIRIN 81 MG PO CHEW
CHEWABLE_TABLET | ORAL | Status: AC
  Filled 2013-08-10: qty 4

## 2013-08-10 MED ORDER — CARVEDILOL 25 MG PO TABS
25.0000 mg | ORAL_TABLET | Freq: Two times a day (BID) | ORAL | Status: DC
  Administered 2013-08-10 – 2013-08-11 (×2): 25 mg via ORAL
  Filled 2013-08-10 (×4): qty 1

## 2013-08-10 MED ORDER — HEPARIN (PORCINE) IN NACL 2-0.9 UNIT/ML-% IJ SOLN
INTRAMUSCULAR | Status: AC
  Filled 2013-08-10: qty 1000

## 2013-08-10 NOTE — Interval H&P Note (Signed)
History and Physical Interval Note:  08/10/2013 2:52 PM  Mario Torres  has presented today for surgery, with the diagnosis of c/p  The various methods of treatment have been discussed with the patient and family. After consideration of risks, benefits and other options for treatment, the patient has consented to  Procedure(s): LEFT HEART CATHETERIZATION WITH CORONARY ANGIOGRAM (N/A) as a surgical intervention .  The patient's history has been reviewed, patient examined, no change in status, stable for surgery.  I have reviewed the patient's chart and labs.  Questions were answered to the patient's satisfaction.     KELLY,THOMAS A

## 2013-08-10 NOTE — CV Procedure (Signed)
GLADYS GUTMAN is a 62 y.o. male    [id number]  [id number] LOCATION:  FACILITY: [facility]  PHYSICIAN: Lennette Bihari, MD, [facility] Dec 21, 1950   DATE OF PROCEDURE:  08/10/2013    [facility]  CARDIAC CATHETERIZATION     HISTORY:  Mr. Osha Errico is a 62 year old gentleman who suffered an acute coronary syndrome in February 2012. Emergent catheterization revealed 95% proximal circumflex stenosis as well as 90% RCA stenosis proximally in addition to mild narrowing in the OM1 branch of the circumflex and LAD. He underwent insertion of a 3.5x16 mm DES stent to the circumflex and 3 days later underwent successful staged intervention to his proximal right coronary artery. He had done well and a nuclear perfusion study one year later showed fairly normal perfusion without scar or ischemia. He does have a history of hypertension as well as hyperlipidemia. Over the past 6 months, he discontinued his medications. Currently was admitted several days ago to [facility] with chest pressure. He now presents for definitive repeat cardiac catheterization.   PROCEDURE:  The patient was brought to the second floor [place] [facility]iac cath lab in the postabsorptive state. He was premedicated with Versed 2 mg and fentanyl 50 mcg intravenously. He then received an additional 1 mg of Versed and 25 mcg of fentanyl for additional sedation. His right groin was prepped and shaved in usual sterile fashion. Xylocaine 1% was used for local anesthesia. A 5 French sheath was inserted into the right femoral artery. Diagnostic catheterizatiion was done with 5 [place] LF4, FR4, and pigtail catheters. Left ventriculography was done with 25 cc Omnipaque contrast. Hemostasis was obtained by direct manual compression. The patient tolerated the procedure well.   HEMODYNAMICS:   Central Aorta: 126/76   Left Ventricle: 126/18  ANGIOGRAPHY:  1. Left main: short and normal 2. LAD: Essentially normal and  gave rise to 2 small diagonal vessels and several septal perforating arteries. 3. Left circumflex:  Widely patent proximal stent without any restenosis and evidence for mild luminal irregularity with 20% stenosis in the mid AV groove circumflex 4. Right coronary artery: Widely patent proximal RCA stent   6. RAO ventriculography revealed normal LV contractility with an ejection fraction of approximately 65% without segmental wall motion abnormalities.   IMPRESSION:  Normal LV function with an ejection fraction of approximately 65% No significant residual CAD with normal LAD, widely patent previously placed stents in the proximal circumflex and right coronary artery with evidence for mild luminal irregularity of 20% narrowing in the mid AV groove circumflex.  RECOMMENDATION:  Medical therapy with improved blood pressure control and aggressive lipid lowering therapy.  Lennette Bihari, MD, [facility] 08/10/2013 4:15 PM

## 2013-08-10 NOTE — Interval H&P Note (Signed)
Cath Lab Visit (complete for each Cath Lab visit)  Clinical Evaluation Leading to the Procedure:   ACS: no  Non-ACS:    Anginal Classification: CCS III  Anti-ischemic medical therapy: Maximal Therapy (2 or more classes of medications)  Non-Invasive Test Results: No non-invasive testing performed  Prior CABG: No previous CABG      History and Physical Interval Note:  08/10/2013 2:52 PM  Joneen Caraway  has presented today for surgery, with the diagnosis of c/p  The various methods of treatment have been discussed with the patient and family. After consideration of risks, benefits and other options for treatment, the patient has consented to  Procedure(s): LEFT HEART CATHETERIZATION WITH CORONARY ANGIOGRAM (N/A) as a surgical intervention .  The patient's history has been reviewed, patient examined, no change in status, stable for surgery.  I have reviewed the patient's chart and labs.  Questions were answered to the patient's satisfaction.     KELLY,THOMAS A

## 2013-08-10 NOTE — Progress Notes (Signed)
ANTICOAGULATION CONSULT NOTE -follow up Pharmacy Consult for Heparin Indication: chest pain/ACS  No Known Allergies  Patient Measurements: Height: 5\' 10"  (177.8 cm) Weight: 236 lb 15.9 oz (107.5 kg) IBW/kg (Calculated) : 73 Heparin Dosing Weight: 98 kg  Vital Signs: Temp: 98.3 F (36.8 C) (08/25 0847) Temp src: Oral (08/25 0847) BP: 181/123 mmHg (08/25 0847) Pulse Rate: 81 (08/25 0847)  Labs:  Recent Labs  08/07/13 1501 08/07/13 2115  08/08/13 0220  08/08/13 1600 08/09/13 0510 08/10/13 0513  HGB  --   --   --  16.2  --   --  15.0 15.7  HCT  --   --   --  45.0  --   --  41.6 46.1  PLT  --   --   --  132*  --   --  135* 124*  LABPROT  --   --   --   --   --   --   --  13.2  INR  --   --   --   --   --   --   --  1.02  HEPARINUNFRC  --   --   < >  --   < > 0.48 0.62 0.76*  CREATININE  --   --   --  0.97  --   --  1.05 1.02  TROPONINI <0.30 <0.30  --  <0.30  --   --   --   --   < > = values in this interval not displayed.  Estimated Creatinine Clearance: 92.2 ml/min (by C-G formula based on Cr of 1.02).  Assessment: 62 year old male with ACS who has been noncompliant with his medications presented to the ED with CP.  Heparin is above  goal (HL=0.76) at 1650 units/hr. CBC stable and patient noted for cath today.  Goal of Therapy:  Heparin level 0.3-0.7 units/ml Monitor platelets by anticoagulation protocol: Yes   Plan:  1. Decrease heparin yo 1550 units/hr 2. Daily heparin level and CBC  Harland German, Pharm D 08/10/2013 9:23 AM

## 2013-08-10 NOTE — H&P (View-Only) (Signed)
[facility]  Subjective: No chest pain. Has HA (on IV NTG).   Objective: Vital signs in last 24 hours: Temp:  [98.2 F (36.8 C)-98.6 F (37 C)] 98.3 F (36.8 C) (08/25 0750) Resp:  [18-20] 18 (08/24 1217) BP: (121-186)/(71-103) 168/103 mmHg (08/25 0750) SpO2:  [94 %-96 %] 96 % (08/25 0750) Last BM Date: 08/07/13  Intake/Output from previous day: 08/24 0701 - 08/25 0700 In: 1941.5 [P.O.:720; I.V.:1221.5] Out: 400 [Urine:400] Intake/Output this shift:    Medications Current Facility-Administered Medications  Medication Dose Route Frequency Provider Last Rate Last Dose  . 0.9 %  sodium chloride infusion  1,000 mL Intravenous Continuous Fayrene Helper, PA-C   1,000 mL at 08/07/13 1035  . 0.9 %  sodium chloride infusion  250 mL Intravenous PRN Brittainy Simmons, PA-C      . 0.9 %  sodium chloride infusion   Intravenous Continuous Brittainy Simmons, PA-C 75 mL/hr at 08/09/13 2052    . 0.9 %  sodium chloride infusion   Intravenous Continuous Marykay Lex, MD 10 mL/hr at 08/09/13 1700    . acetaminophen (TYLENOL) tablet 650 mg  650 mg Oral Q4H PRN Brittainy Simmons, PA-C   650 mg at 08/10/13 0424  . ALPRAZolam Prudy Feeler) tablet 0.25 mg  0.25 mg Oral BID PRN Robbie Lis, PA-C   0.25 mg at 08/09/13 2051  . alum & mag hydroxide-simeth (MAALOX/MYLANTA) 200-200-20 MG/5ML suspension 15 mL  15 mL Oral PRN Marykay Lex, MD      . aspirin EC tablet 81 mg  81 mg Oral Daily Brittainy Simmons, PA-C   81 mg at 08/09/13 1030  . atorvastatin (LIPITOR) tablet 80 mg  80 mg Oral q1800 Chrystie Nose, MD   80 mg at 08/09/13 1841  . carvedilol (COREG) tablet 12.5 mg  12.5 mg Oral BID WC Chrystie Nose, MD   12.5 mg at 08/09/13 1629  . heparin ADULT infusion 100 units/mL (25000 units/250 mL)  1,650 Units/hr Intravenous Continuous Herby Abraham, RPH 16.5 mL/hr at 08/10/13 0800 1,650 Units/hr at 08/10/13 0800  . hydrALAZINE (APRESOLINE) injection 10 mg  10 mg  Intravenous Q4H PRN Brittainy Simmons, PA-C   10 mg at 08/09/13 1629  . irbesartan (AVAPRO) tablet 75 mg  75 mg Oral Daily Chrystie Nose, MD   75 mg at 08/09/13 1030  . morphine 2 MG/ML injection 2 mg  2 mg Intravenous Q4H PRN Brittainy Simmons, PA-C   2 mg at 08/09/13 0114  . nitroGLYCERIN (NITROSTAT) SL tablet 0.4 mg  0.4 mg Sublingual Q5 min PRN Brittainy Simmons, PA-C      . nitroGLYCERIN 0.2 mg/mL in dextrose 5 % infusion  3-30 mcg/min Intravenous Titrated Quintella Reichert, MD 1.5 mL/hr at 08/10/13 0600 5 mcg/min at 08/10/13 0600  . ondansetron (ZOFRAN) injection 4 mg  4 mg Intravenous Q6H PRN Brittainy Simmons, PA-C   4 mg at 08/09/13 0856  . pantoprazole (PROTONIX) EC tablet 40 mg  40 mg Oral Daily Chrystie Nose, MD   40 mg at 08/09/13 1030  . sodium chloride 0.9 % injection 3 mL  3 mL Intravenous Q12H Brittainy Simmons, PA-C   3 mL at 08/09/13 1030  . sodium chloride 0.9 % injection 3 mL  3 mL Intravenous PRN Brittainy Simmons, PA-C      . zolpidem (AMBIEN) tablet 5 mg  5 mg Oral QHS PRN,MR X 1 Brittainy Simmons, PA-C   5 mg at 08/09/13 2253  PE: General appearance: alert, cooperative and no distress Lungs: clear to auscultation bilaterally Heart: regular rate and rhythm, S1, S2 normal, no murmur, click, rub or gallop Extremities: no LEE Pulses: 2+ and symmetric Skin: warm and dry Neurologic: Grossly normal  Lab Results:   Recent Labs  08/08/13 0220 08/09/13 0510 08/10/13 0513  WBC 7.0 8.2 5.8  HGB 16.2 15.0 15.7  HCT 45.0 41.6 46.1  PLT 132* 135* 124*   BMET  Recent Labs  08/08/13 0220 08/09/13 0510 08/10/13 0513  NA 137 137 139  K 4.5 4.1 4.0  CL 102 103 104  CO2 28 27 27   GLUCOSE 105* 99 86  BUN 13 10 9   CREATININE 0.97 1.05 1.02  CALCIUM 9.1 9.5 9.2   PT/INR  Recent Labs  08/10/13 0513  LABPROT 13.2  INR 1.02   Cholesterol  Recent Labs  08/08/13 0220  CHOL 227*   Lipid Panel     Component Value Date/Time   CHOL 227* 08/08/2013 0220    TRIG 119 08/08/2013 0220   HDL 38* 08/08/2013 0220   CHOLHDL 6.0 08/08/2013 0220   VLDL 24 08/08/2013 0220   LDLCALC 165* 08/08/2013 0220   Cardiac Panel (last 3 results)  Recent Labs  08/07/13 1501 08/07/13 2115 08/08/13 0220  TROPONINI <0.30 <0.30 <0.30   Assessment/Plan  Principal Problem:   Unstable angina Active Problems:   CAD- s/p PCI w/ DES to circumflex, and RCA in 2012   HTN (hypertension)   HLD (hyperlipidemia)   History of non-ST elevation myocardial infarction (NSTEMI) -2012   H/O noncompliance with medical treatment, presenting hazards to health   GERD (gastroesophageal reflux disease)  Plan: No further chest pain. He ruled out for MI. Plan for diagnostic LHC today with Dr. Tresa Endo. Renal function and INR both WNL. HR is stable. He remains hypertensive w/ SBP in the 180s. He was just given AM dose of Coreg. He is on a  dose of 12.5 mg BID. Also on an ARB. His HR is in the 90s. Will increase Coreg dose to 25 mg BID. Will follow post-cath.    LOS: 3 days    Brittainy M. Delmer Islam 08/10/2013 8:50 AM   Patient seen and examined. Agree with assessment and plan. No further chest pain presently but he has had recurrent symptoms since admission. Discussed cath with possible PCI if indicated. Plan cath today.   Lennette Bihari, MD, [facility] 08/10/2013 12:38 PM

## 2013-08-10 NOTE — Progress Notes (Signed)
[facility]  Subjective: No chest pain. Has HA (on IV NTG).   Objective: Vital signs in last 24 hours: Temp:  [98.2 F (36.8 C)-98.6 F (37 C)] 98.3 F (36.8 C) (08/25 0750) Resp:  [18-20] 18 (08/24 1217) BP: (121-186)/(71-103) 168/103 mmHg (08/25 0750) SpO2:  [94 %-96 %] 96 % (08/25 0750) Last BM Date: 08/07/13  Intake/Output from previous day: 08/24 0701 - 08/25 0700 In: 1941.5 [P.O.:720; I.V.:1221.5] Out: 400 [Urine:400] Intake/Output this shift:    Medications Current Facility-Administered Medications  Medication Dose Route Frequency Provider Last Rate Last Dose  . 0.9 %  sodium chloride infusion  1,000 mL Intravenous Continuous Bowie Tran, PA-C   1,000 mL at 08/07/13 1035  . 0.9 %  sodium chloride infusion  250 mL Intravenous PRN Brittainy Simmons, PA-C      . 0.9 %  sodium chloride infusion   Intravenous Continuous Brittainy Simmons, PA-C 75 mL/hr at 08/09/13 2052    . 0.9 %  sodium chloride infusion   Intravenous Continuous David W Harding, MD 10 mL/hr at 08/09/13 1700    . acetaminophen (TYLENOL) tablet 650 mg  650 mg Oral Q4H PRN Brittainy Simmons, PA-C   650 mg at 08/10/13 0424  . ALPRAZolam (XANAX) tablet 0.25 mg  0.25 mg Oral BID PRN Brittainy Simmons, PA-C   0.25 mg at 08/09/13 2051  . alum & mag hydroxide-simeth (MAALOX/MYLANTA) 200-200-20 MG/5ML suspension 15 mL  15 mL Oral PRN David W Harding, MD      . aspirin EC tablet 81 mg  81 mg Oral Daily Brittainy Simmons, PA-C   81 mg at 08/09/13 1030  . atorvastatin (LIPITOR) tablet 80 mg  80 mg Oral q1800 Kenneth C. Hilty, MD   80 mg at 08/09/13 1841  . carvedilol (COREG) tablet 12.5 mg  12.5 mg Oral BID WC Kenneth C. Hilty, MD   12.5 mg at 08/09/13 1629  . heparin ADULT infusion 100 units/mL (25000 units/250 mL)  1,650 Units/hr Intravenous Continuous Michelle T Bell, RPH 16.5 mL/hr at 08/10/13 0800 1,650 Units/hr at 08/10/13 0800  . hydrALAZINE (APRESOLINE) injection 10 mg  10 mg  Intravenous Q4H PRN Brittainy Simmons, PA-C   10 mg at 08/09/13 1629  . irbesartan (AVAPRO) tablet 75 mg  75 mg Oral Daily Kenneth C. Hilty, MD   75 mg at 08/09/13 1030  . morphine 2 MG/ML injection 2 mg  2 mg Intravenous Q4H PRN Brittainy Simmons, PA-C   2 mg at 08/09/13 0114  . nitroGLYCERIN (NITROSTAT) SL tablet 0.4 mg  0.4 mg Sublingual Q5 min PRN Brittainy Simmons, PA-C      . nitroGLYCERIN 0.2 mg/mL in dextrose 5 % infusion  3-30 mcg/min Intravenous Titrated Traci R Turner, MD 1.5 mL/hr at 08/10/13 0600 5 mcg/min at 08/10/13 0600  . ondansetron (ZOFRAN) injection 4 mg  4 mg Intravenous Q6H PRN Brittainy Simmons, PA-C   4 mg at 08/09/13 0856  . pantoprazole (PROTONIX) EC tablet 40 mg  40 mg Oral Daily Kenneth C. Hilty, MD   40 mg at 08/09/13 1030  . sodium chloride 0.9 % injection 3 mL  3 mL Intravenous Q12H Brittainy Simmons, PA-C   3 mL at 08/09/13 1030  . sodium chloride 0.9 % injection 3 mL  3 mL Intravenous PRN Brittainy Simmons, PA-C      . zolpidem (AMBIEN) tablet 5 mg  5 mg Oral QHS PRN,MR X 1 Brittainy Simmons, PA-C   5 mg at 08/09/13 2253      PE: General appearance: alert, cooperative and no distress Lungs: clear to auscultation bilaterally Heart: regular rate and rhythm, S1, S2 normal, no murmur, click, rub or gallop Extremities: no LEE Pulses: 2+ and symmetric Skin: warm and dry Neurologic: Grossly normal  Lab Results:   Recent Labs  08/08/13 0220 08/09/13 0510 08/10/13 0513  WBC 7.0 8.2 5.8  HGB 16.2 15.0 15.7  HCT 45.0 41.6 46.1  PLT 132* 135* 124*   BMET  Recent Labs  08/08/13 0220 08/09/13 0510 08/10/13 0513  NA 137 137 139  K 4.5 4.1 4.0  CL 102 103 104  CO2 28 27 27  GLUCOSE 105* 99 86  BUN 13 10 9  CREATININE 0.97 1.05 1.02  CALCIUM 9.1 9.5 9.2   PT/INR  Recent Labs  08/10/13 0513  LABPROT 13.2  INR 1.02   Cholesterol  Recent Labs  08/08/13 0220  CHOL 227*   Lipid Panel     Component Value Date/Time   CHOL 227* 08/08/2013 0220    TRIG 119 08/08/2013 0220   HDL 38* 08/08/2013 0220   CHOLHDL 6.0 08/08/2013 0220   VLDL 24 08/08/2013 0220   LDLCALC 165* 08/08/2013 0220   Cardiac Panel (last 3 results)  Recent Labs  08/07/13 1501 08/07/13 2115 08/08/13 0220  TROPONINI <0.30 <0.30 <0.30   Assessment/Plan  Principal Problem:   Unstable angina Active Problems:   CAD- s/p PCI w/ DES to circumflex, and RCA in 2012   HTN (hypertension)   HLD (hyperlipidemia)   History of non-ST elevation myocardial infarction (NSTEMI) -2012   H/O noncompliance with medical treatment, presenting hazards to health   GERD (gastroesophageal reflux disease)  Plan: No further chest pain. He ruled out for MI. Plan for diagnostic LHC today with Dr. Josemanuel Eakins. Renal function and INR both WNL. HR is stable. He remains hypertensive w/ SBP in the 180s. He was just given AM dose of Coreg. He is on a  dose of 12.5 mg BID. Also on an ARB. His HR is in the 90s. Will increase Coreg dose to 25 mg BID. Will follow post-cath.    LOS: 3 days    Brittainy M. Simmons, PA-C 08/10/2013 8:50 AM   Patient seen and examined. Agree with assessment and plan. No further chest pain presently but he has had recurrent symptoms since admission. Discussed cath with possible PCI if indicated. Plan cath today.   Analeia Ismael A. Deldrick Linch, MD, FACC 08/10/2013 12:38 PM   

## 2013-08-11 ENCOUNTER — Telehealth: Payer: Self-pay | Admitting: Cardiovascular Disease

## 2013-08-11 LAB — BASIC METABOLIC PANEL
CO2: 24 mEq/L (ref 19–32)
Chloride: 107 mEq/L (ref 96–112)
Creatinine, Ser: 0.95 mg/dL (ref 0.50–1.35)
Glucose, Bld: 82 mg/dL (ref 70–99)

## 2013-08-11 LAB — CBC
Hemoglobin: 16 g/dL (ref 13.0–17.0)
MCV: 90.6 fL (ref 78.0–100.0)
Platelets: 128 10*3/uL — ABNORMAL LOW (ref 150–400)
RBC: 5.1 MIL/uL (ref 4.22–5.81)
WBC: 5.7 10*3/uL (ref 4.0–10.5)

## 2013-08-11 MED ORDER — IRBESARTAN 150 MG PO TABS
150.0000 mg | ORAL_TABLET | Freq: Every day | ORAL | Status: DC
  Administered 2013-08-11: 150 mg via ORAL
  Filled 2013-08-11: qty 1

## 2013-08-11 MED ORDER — IRBESARTAN 150 MG PO TABS: 150.0000 mg | ORAL_TABLET | Freq: Every day | ORAL | Status: DC

## 2013-08-11 MED ORDER — ATORVASTATIN CALCIUM 80 MG PO TABS: 80.0000 mg | ORAL_TABLET | Freq: Every day | ORAL | Status: DC

## 2013-08-11 MED ORDER — ASPIRIN 81 MG PO TBEC: 81.0000 mg | DELAYED_RELEASE_TABLET | Freq: Every day | ORAL | Status: DC

## 2013-08-11 MED ORDER — CARVEDILOL 25 MG PO TABS: 25.0000 mg | ORAL_TABLET | Freq: Two times a day (BID) | ORAL | Status: DC

## 2013-08-11 NOTE — Telephone Encounter (Signed)
Just discharged from the hospital-forgot to get prescription for his nitrgylcerin-Please call this in to [phone].

## 2013-08-11 NOTE — Telephone Encounter (Signed)
Message forwarded to B. Hager, PA-C. 

## 2013-08-11 NOTE — Telephone Encounter (Signed)
Done

## 2013-08-11 NOTE — Progress Notes (Signed)
Patient discharged to home with wife ambulating to vehicle.  Patient and wife ambulated in hallway x2 (approx. 222ft) without c/o chest pain, discomfort or SOB.  PIV d/c'd with pressure dressing applied to site.  Right groin Level "0".  Telemetry monitor disconnected.  D/C instructions given w/all questions and concerns addressed/answered.  Teach back done regarding clarification and instruction of when to take SL NTG.  Patient and wife verbalized understanding.  Patient awake, alert and oriented at time of discharge.

## 2013-08-11 NOTE — Progress Notes (Signed)
Subjective: No further CP.  Objective: Vital signs in last 24 hours: Temp:  [97.7 F (36.5 C)-98.3 F (36.8 C)] 97.7 F (36.5 C) (08/26 0747) Pulse Rate:  [72-81] 72 (08/25 1645) Resp:  [9-19] 16 (08/26 0747) BP: (117-181)/(65-123) 131/85 mmHg (08/26 0530) SpO2:  [94 %-96 %] 96 % (08/26 0747) Last BM Date: 08/07/13  Intake/Output from previous day: 08/25 0701 - 08/26 0700 In: 2327.2 [I.V.:2327.2] Out: -  Intake/Output this shift:    Medications Current Facility-Administered Medications  Medication Dose Route Frequency Provider Last Rate Last Dose  . 0.9 %  sodium chloride infusion  1,000 mL Intravenous Continuous Fayrene Helper, PA-C   1,000 mL at 08/07/13 1035  . 0.9 %  sodium chloride infusion   Intravenous Continuous Marykay Lex, MD 10 mL/hr at 08/09/13 1700    . 0.9 %  sodium chloride infusion   Intravenous Continuous Lennette Bihari, MD 100 mL/hr at 08/11/13 0600    . acetaminophen (TYLENOL) tablet 650 mg  650 mg Oral Q4H PRN Brittainy Simmons, PA-C   650 mg at 08/10/13 1417  . ALPRAZolam Prudy Feeler) tablet 0.25 mg  0.25 mg Oral BID PRN Brittainy Simmons, PA-C   0.25 mg at 08/10/13 1709  . alum & mag hydroxide-simeth (MAALOX/MYLANTA) 200-200-20 MG/5ML suspension 15 mL  15 mL Oral PRN Marykay Lex, MD      . aspirin EC tablet 81 mg  81 mg Oral Daily Brittainy Simmons, PA-C   81 mg at 08/09/13 1030  . atorvastatin (LIPITOR) tablet 80 mg  80 mg Oral q1800 Chrystie Nose, MD   80 mg at 08/10/13 1747  . carvedilol (COREG) tablet 25 mg  25 mg Oral BID WC Brittainy Simmons, PA-C   25 mg at 08/10/13 1708  . hydrALAZINE (APRESOLINE) injection 10 mg  10 mg Intravenous Q4H PRN Brittainy Simmons, PA-C   10 mg at 08/11/13 0338  . irbesartan (AVAPRO) tablet 75 mg  75 mg Oral Daily Chrystie Nose, MD   75 mg at 08/10/13 1020  . morphine 2 MG/ML injection 2 mg  2 mg Intravenous Q4H PRN Brittainy Simmons, PA-C   2 mg at 08/10/13 1243  . nitroGLYCERIN (NITROSTAT) SL tablet 0.4 mg  0.4  mg Sublingual Q5 min PRN Brittainy Simmons, PA-C      . ondansetron (ZOFRAN) injection 4 mg  4 mg Intravenous Q6H PRN Brittainy Simmons, PA-C   4 mg at 08/09/13 0856  . pantoprazole (PROTONIX) EC tablet 40 mg  40 mg Oral Daily Chrystie Nose, MD   40 mg at 08/10/13 1021  . zolpidem (AMBIEN) tablet 5 mg  5 mg Oral QHS PRN,MR X 1 Brittainy Simmons, PA-C   5 mg at 08/10/13 2235    PE: General appearance: alert, cooperative and no distress Lungs: clear to auscultation bilaterally Heart: regular rate and rhythm, S1, S2 normal, no murmur, click, rub or gallop Extremities: No LEE Pulses: 2+ and symmetric Skin: Right groin:  No hematoma, ecchymosis.  Nontender. Neurologic: Grossly normal  Lab Results:   Recent Labs  08/09/13 0510 08/10/13 0513 08/11/13 0425  WBC 8.2 5.8 5.7  HGB 15.0 15.7 16.0  HCT 41.6 46.1 46.2  PLT 135* 124* 128*   BMET  Recent Labs  08/09/13 0510 08/10/13 0513 08/11/13 0425  NA 137 139 140  K 4.1 4.0 3.9  CL 103 104 107  CO2 27 27 24   GLUCOSE 99 86 82  BUN 10 9 9   CREATININE 1.05 1.02  0.95  CALCIUM 9.5 9.2 9.3   PT/INR  Recent Labs  08/10/13 0513  LABPROT 13.2  INR 1.02    Assessment/Plan  Principal Problem:   Unstable angina Active Problems:   CAD- s/p PCI w/ DES to circumflex, and RCA in 2012   HTN (hypertension)   HLD (hyperlipidemia)   History of non-ST elevation myocardial infarction (NSTEMI) -2012   H/O noncompliance with medical treatment, presenting hazards to health   GERD (gastroesophageal reflux disease)  Plan:  Ruled out for MI.  SP LHC revealing normal LVEF, no significant CAD, patent circ and RCA stents, 20% AV groove circ.  BP still elevated.  Increasing irbesartan to 150.  Labs stable.  LDL poorly controlled.  On lipitor 80 now.   DC home today.    LOS: 4 days    HAGER, BRYAN 08/11/2013 8:01 AM   Patient seen and examined. Agree with assessment and plan. Feels well. BP elevated this am; agree with titration of  ARB. Pt agrees to take a statin.  Plan DC later today.   Lennette Bihari, MD, [facility] 08/11/2013 8:56 AM

## 2013-08-12 NOTE — Discharge Summary (Signed)
Physician Discharge Summary  Patient ID: Mario Torres MRN: [id number] DOB/AGE: 1951-04-19 62 y.o.  Admit date: 08/07/2013 Discharge date: 08/12/2013  Admission Diagnoses: Unstable angina  Discharge Diagnoses:  Principal Problem:   Unstable angina Active Problems:   CAD- s/p PCI w/ DES to circumflex, and RCA in 2012   HTN (hypertension)   HLD (hyperlipidemia)   History of non-ST elevation myocardial infarction (NSTEMI) -2012   H/O noncompliance with medical treatment, presenting hazards to health   GERD (gastroesophageal reflux disease)   Discharged Condition: stable  Hospital Course:  The patient is a 62 year old male followed by Dr. Tresa Endo, who, in February 2012, suffered an acute coronary syndrome. Emergent catheterization on February 06, 2011, showed 95% proximal circumflex stenosis as well as 90% RCA stenosis proximally. In addition, he had 60% stenosis of the OM-1 branch of the circumflex and 40% LAD stenosis. A 3.5 x 16 mm DE Promus stent was inserted in the circumflex, post dilated to 4.0 mm acutely. Three days later, he underwent staged intervention to his 90% to 95% proximal RCA stenosis and has been on medical therapy for concomitant CAD. The patient underwent a 1-year followup nuclear perfusion study to assess for late restenosis following his acute coronary syndrome. This was done on February 19, 2012, and showed fairly normal perfusion without scar or ischemia. Post stress ejection fraction was 61%. Additional problems include hypertension, hyperlipidemia.   He presented to the [facility] ER with a complaint of chest pain x 1 day. The pain first occurred at rest yesterday morning. Described as substernal chest pressure. The pain has been intermittent. It recurrs and is increased with exertion. He has had some radiation to the left upper extremity. He noted associated nausea, SOB and dizziness. He also notes increased DOE over the last several months. He gets easily fatigued walking the  steps in his home. He denies orthopnea/ PND and LEE. The patient reports that he has been noncompliant with his home medications. He stopped all of his prescribed meds 8 months ago. He states that he did not like the way he felt while on them, noting weakness and fatigue.  In the ED, he was given SL NTG and notes improvement.  EKG shows a RBBB (old) and no acute changes. Initial troponin is negative. CXR is unremarkable.   The patient was admitted and started on hep and IV NTG.  He ruled out for MI.  LDL cholesterol is 165.  He was started on lipitor 80 mg.  Left heart cath completed on 08/10/13 revealed normal LV function with an ejection fraction of approximately 65%.  No significant residual CAD with normal LAD, widely patent previously placed stents in the proximal circumflex and right coronary artery with evidence for mild luminal irregularity of 20% narrowing in the mid AV groove circumflex.  Due to elevated BP irbesartan was increased to 150 mg daily.  The patient was seen by Dr. Tresa Endo who felt he was stable for DC home.    Consults: None  Significant Diagnostic Studies: Left heart cath HEMODYNAMICS:  Central Aorta: 126/76  Left Ventricle: 126/18  ANGIOGRAPHY:  1. Left main: short and normal  2. LAD: Essentially normal and gave rise to 2 small diagonal vessels and several septal perforating arteries.  3. Left circumflex: Widely patent proximal stent without any restenosis and evidence for mild luminal irregularity with 20% stenosis in the mid AV groove circumflex  4. Right coronary artery: Widely patent proximal RCA stent  6. RAO ventriculography revealed normal LV contractility  with an ejection fraction of approximately 65% without segmental wall motion abnormalities.  IMPRESSION:  Normal LV function with an ejection fraction of approximately 65%  No significant residual CAD with normal LAD, widely patent previously placed stents in the proximal circumflex and right coronary artery with  evidence for mild luminal irregularity of 20% narrowing in the mid AV groove circumflex.  RECOMMENDATION:  Medical therapy with improved blood pressure control and aggressive lipid lowering therapy.  Lennette Bihari, MD, [facility]  08/10/2013  Treatments: See above  Discharge Exam: Blood pressure 135/76, pulse 72, temperature 97.7 F (36.5 C), temperature source Oral, resp. rate 16, height 5\' 10"  (1.778 m), weight 236 lb 15.9 oz (107.5 kg), SpO2 96.00%.   Disposition: 01-Home or Self Care  Discharge Orders   Future Appointments Provider Department Dept Phone   09/03/2013 2:00 PM Wilburt Finlay, PA-C [facility] [place] [phone]   Future Orders Complete By Expires   Diet - low sodium heart healthy  As directed    Discharge instructions  As directed    Comments:     No lifting more than a half gallon of milk or driving for three days.   Increase activity slowly  As directed        Medication List         aspirin 81 MG EC tablet  Take 1 tablet (81 mg total) by mouth daily.     atorvastatin 80 MG tablet  Commonly known as:  LIPITOR  Take 1 tablet (80 mg total) by mouth daily at 6 PM.     calcium citrate 950 MG tablet  Commonly known as:  CALCITRATE - dosed in mg elemental calcium  Take 1 tablet by mouth daily as needed (for vitamin).     carvedilol 25 MG tablet  Commonly known as:  COREG  Take 1 tablet (25 mg total) by mouth 2 (two) times daily with a meal.     CINNAMON PO  Take 1,000 mg by mouth daily as needed (for vitamin).     CoQ10 100 MG Caps  Take 100 mg by mouth daily as needed (for vitamin).     HOMOCYSTEINE FORMULA 0.8-50-0.1 MG Tabs  Generic drug:  Folic Acid-Vit B6-Vit B12  Take 1 tablet by mouth daily as needed (for vitamin).     irbesartan 150 MG tablet  Commonly known as:  AVAPRO  Take 1 tablet (150 mg total) by mouth daily.     L-Lysine 1000 MG Tabs  Take 1 tablet by mouth daily as needed (for vitamin).     multivitamin  with minerals Tabs tablet  Take 1 tablet by mouth daily.     nitroGLYCERIN 0.4 MG SL tablet  Commonly known as:  NITROSTAT  Place 0.4 mg under the tongue every 5 (five) minutes as needed for chest pain.     OVER THE COUNTER MEDICATION  Take 1 capsule by mouth daily as needed ("Arte-clear"   5mg /35mg    for vitamin).     OVER THE COUNTER MEDICATION  Take 1 tablet by mouth daily as needed ("Vasotensin"    for vitamin).     OVER THE COUNTER MEDICATION  Take 1 tablet by mouth daily as needed ("Cardioblend"       for vitamin).     PHYTOSTEROL COMPLEX 500 MG Caps  Take 1,000 mg by mouth daily as needed (for vitamin).     VITAMIN C CR 1500 MG Tbcr  Take 1 tablet by mouth daily as needed (  for vitamin).           Follow-up Information   Follow up with Alyra Patty, PA-C On 09/03/2013. (2 pm)    Specialty:  Physician Assistant   Contact information:   [address] [place] [address] [phone]       Signed: Wilburt Finlay 08/12/2013, 9:43 AM

## 2013-08-18 ENCOUNTER — Telehealth: Payer: Self-pay | Admitting: Cardiovascular Disease

## 2013-08-18 ENCOUNTER — Telehealth: Payer: Self-pay | Admitting: *Deleted

## 2013-08-18 NOTE — Telephone Encounter (Signed)
Patient returned a call to me stating this morning his B/P dropped down to 100/48 and he was dizzy. Since then he is now feeling better and his B/P has now gone up to 138/68. Recommended to the patient to stay well hydrated and monitor his blood pressure. Monitor his activity in the heat. If he notices that his symptoms returns, or if his blood pressure decreases and does not go back up he is to call and notify us. Patient voiced his understanding.

## 2013-08-18 NOTE — Telephone Encounter (Signed)
Left message on mobile # call returned. Call back if still having concerns.

## 2013-08-18 NOTE — Telephone Encounter (Signed)
Taking a blood thinner,feels real dizzy to the point of just about passing out.What should he do?  Pt is calling a second time concerned about what he should do. He is not feeling as dizzy as before.

## 2013-08-18 NOTE — Telephone Encounter (Signed)
Taking a blood thinner,feels real dizzy to the point of just about passing out.What should he do?

## 2013-08-25 ENCOUNTER — Emergency Department (HOSPITAL_COMMUNITY)
Admission: EM | Admit: 2013-08-25 | Discharge: 2013-08-25 | Disposition: A | Discharge status: Home / Self Care | Payer: 59 | Attending: Emergency Medicine | Admitting: Emergency Medicine

## 2013-08-25 ENCOUNTER — Encounter (HOSPITAL_COMMUNITY): Payer: Self-pay | Admitting: *Deleted

## 2013-08-25 ENCOUNTER — Emergency Department (HOSPITAL_COMMUNITY): Payer: 59

## 2013-08-25 DIAGNOSIS — R079 Chest pain, unspecified: Secondary | ICD-10-CM

## 2013-08-25 DIAGNOSIS — I252 Old myocardial infarction: Secondary | ICD-10-CM | POA: Insufficient documentation

## 2013-08-25 DIAGNOSIS — Z7982 Long term (current) use of aspirin: Secondary | ICD-10-CM | POA: Insufficient documentation

## 2013-08-25 DIAGNOSIS — R0602 Shortness of breath: Secondary | ICD-10-CM | POA: Insufficient documentation

## 2013-08-25 DIAGNOSIS — R0789 Other chest pain: Secondary | ICD-10-CM

## 2013-08-25 DIAGNOSIS — I10 Essential (primary) hypertension: Secondary | ICD-10-CM | POA: Diagnosis present

## 2013-08-25 DIAGNOSIS — I2510 Atherosclerotic heart disease of native coronary artery without angina pectoris: Secondary | ICD-10-CM | POA: Diagnosis present

## 2013-08-25 DIAGNOSIS — Z79899 Other long term (current) drug therapy: Secondary | ICD-10-CM | POA: Insufficient documentation

## 2013-08-25 LAB — BASIC METABOLIC PANEL
CO2: 25 mEq/L (ref 19–32)
Chloride: 101 mEq/L (ref 96–112)
Creatinine, Ser: 1.03 mg/dL (ref 0.50–1.35)
Glucose, Bld: 115 mg/dL — ABNORMAL HIGH (ref 70–99)

## 2013-08-25 LAB — CBC
Hemoglobin: 15.6 g/dL (ref 13.0–17.0)
MCH: 32.5 pg (ref 26.0–34.0)
MCV: 89.6 fL (ref 78.0–100.0)
Platelets: 176 10*3/uL (ref 150–400)
RBC: 4.8 MIL/uL (ref 4.22–5.81)
WBC: 7.2 10*3/uL (ref 4.0–10.5)

## 2013-08-25 LAB — PRO B NATRIURETIC PEPTIDE: Pro B Natriuretic peptide (BNP): 99.1 pg/mL (ref 0–125)

## 2013-08-25 LAB — POCT I-STAT TROPONIN I: Troponin i, poc: 0.01 ng/mL (ref 0.00–0.08)

## 2013-08-25 MED ORDER — ISOSORBIDE MONONITRATE ER 30 MG PO TB24: 30.0000 mg | ORAL_TABLET | Freq: Every day | ORAL | Status: DC

## 2013-08-25 MED ORDER — ISOSORBIDE MONONITRATE ER 30 MG PO TB24
30.0000 mg | ORAL_TABLET | Freq: Every day | ORAL | Status: DC
  Administered 2013-08-25: 30 mg via ORAL
  Filled 2013-08-25: qty 1

## 2013-08-25 NOTE — Consult Note (Signed)
Reason for Consult: Chest Pain Referring Physician: [facility] Physician  HPI: The patient is a 62 year old male followed by Dr. Tresa Endo, who, in February 2012, suffered an acute coronary syndrome. Emergent catheterization on February 06, 2011, showed 95% proximal circumflex stenosis as well as 90% RCA stenosis proximally. In addition, he had 60% stenosis of the OM-1 branch of the circumflex and 40% LAD stenosis. A 3.5 x 16 mm DE Promus stent was inserted in the circumflex, post dilated to 4.0 mm acutely. Three days later, he underwent staged intervention to his 90% to 95% proximal RCA stenosis and has been on medical therapy for concomitant CAD. The patient underwent a 1-year followup nuclear perfusion study to assess for late restenosis following his acute coronary syndrome. This was done on February 19, 2012, and showed fairly normal perfusion without scar or ischemia. Post stress ejection fraction was 61%. Additional problems include hypertension, hyperlipidemia.   He was recently hospitalized 2 weeks ago for evaluation of chest pain. He ruled out for MI. He underwent a diagnostic LHC by Dr. Tresa Endo on 08/10/13 which demonstrated no significant residual CAD with normal LAD, widely patent previously placed stents in the proximal circumflex and right coronary artery with evidence for mild luminal irregularity of 20% narrowing in the mid AV groove circumflex. He had normal LV function with an EF of 65%. He was discharge home on a BB, an ARB, ASA and statin.   He returned to the [facility] ER today with a complaint of chest pain. He had been in his normal state of health until this morning, when he developed substernal chest pain. The pain was very different from the pain he has had with his previous stents, which was more pressure-like pain. The pain today was a dull ache. It did not radiate and was not worsened with exertion. He had no other symptoms. He thought initially that it was indigestion. He took both an antacid and 1 SL  NGT around the same time, so is unsure which relieved this discomfort. By the time he arrived to the ED, he was chest pain free. His EKG is w/o acute change.    Past Medical History  Diagnosis Date  . Hypertension   . MI (myocardial infarction)     Past Surgical History  Procedure Laterality Date  . Coronary stent placement    . Knee surgery      Family History  Problem Relation Age of Onset  . Hypertension Father     Social History:  reports that he has never smoked. He does not have any smokeless tobacco history on file. He reports that he does not drink alcohol or use illicit drugs.  Allergies: No Known Allergies  Medications: Prior to Admission medications   Medication Sig Start Date End Date Taking? Authorizing Provider  Ascorbic Acid (VITAMIN C CR) 1500 MG TBCR Take 1 tablet by mouth daily as needed (for vitamin).    Yes Historical Provider, MD  aspirin EC 81 MG EC tablet Take 1 tablet (81 mg total) by mouth daily. 08/11/13  Yes Wilburt Finlay, PA-C  atorvastatin (LIPITOR) 80 MG tablet Take 1 tablet (80 mg total) by mouth daily at 6 PM. 08/11/13  Yes Wilburt Finlay, PA-C  calcium citrate (CALCITRATE - DOSED IN MG ELEMENTAL CALCIUM) 950 MG tablet Take 1 tablet by mouth daily as needed (for vitamin).   Yes Historical Provider, MD  carvedilol (COREG) 25 MG tablet Take 1 tablet (25 mg total) by mouth 2 (two) times daily with a  meal. 08/11/13  Yes Wilburt Finlay, PA-C  CINNAMON PO Take 1,000 mg by mouth daily as needed (for vitamin).   Yes Historical Provider, MD  Coenzyme Q10 (COQ10) 100 MG CAPS Take 100 mg by mouth daily as needed (for vitamin).   Yes Historical Provider, MD  Folic Acid-Vit B6-Vit B12 (HOMOCYSTEINE FORMULA) 0.8-50-0.1 MG TABS Take 1 tablet by mouth daily as needed (for vitamin).   Yes Historical Provider, MD  irbesartan (AVAPRO) 150 MG tablet Take 1 tablet (150 mg total) by mouth daily. 08/11/13  Yes Wilburt Finlay, PA-C  L-Lysine 1000 MG TABS Take 1 tablet by mouth daily as  needed (for vitamin).   Yes Historical Provider, MD  Multiple Vitamin (MULTIVITAMIN WITH MINERALS) TABS tablet Take 1 tablet by mouth daily.   Yes Historical Provider, MD  nitroGLYCERIN (NITROSTAT) 0.4 MG SL tablet Place 0.4 mg under the tongue every 5 (five) minutes as needed for chest pain. x3 doses as needed for chest pain   Yes Historical Provider, MD  OVER THE COUNTER MEDICATION Take 1 capsule by mouth daily as needed ("Arte-clear"   5mg /35mg    for vitamin).   Yes Historical Provider, MD  OVER THE COUNTER MEDICATION Take 1 tablet by mouth daily as needed ("Vasotensin"    for vitamin).   Yes Historical Provider, MD  OVER THE COUNTER MEDICATION Take 1 tablet by mouth daily as needed ("Cardioblend"       for vitamin).   Yes Historical Provider, MD  Phytosterol Esters (PHYTOSTEROL COMPLEX) 500 MG CAPS Take 1,000 mg by mouth daily as needed (for vitamin).   Yes Historical Provider, MD     Results for orders placed during the hospital encounter of 08/25/13 (from the past 48 hour(s))  CBC     Status: Abnormal   Collection Time    08/25/13  2:00 PM      Result Value Range   WBC 7.2  4.0 - 10.5 K/uL   RBC 4.80  4.22 - 5.81 MIL/uL   Hemoglobin 15.6  13.0 - 17.0 g/dL   HCT 62.1  30.8 - 65.7 %   MCV 89.6  78.0 - 100.0 fL   MCH 32.5  26.0 - 34.0 pg   MCHC 36.3 (*) 30.0 - 36.0 g/dL   RDW 84.6  96.2 - 95.2 %   Platelets 176  150 - 400 K/uL  BASIC METABOLIC PANEL     Status: Abnormal   Collection Time    08/25/13  2:00 PM      Result Value Range   Sodium 136  135 - 145 mEq/L   Potassium 4.9  3.5 - 5.1 mEq/L   Chloride 101  96 - 112 mEq/L   CO2 25  19 - 32 mEq/L   Glucose, Bld 115 (*) 70 - 99 mg/dL   BUN 16  6 - 23 mg/dL   Creatinine, Ser 8.41  0.50 - 1.35 mg/dL   Calcium 9.9  8.4 - 32.4 mg/dL   GFR calc non Af Amer 76 (*) >90 mL/min   GFR calc Af Amer 88 (*) >90 mL/min   Comment: (NOTE)     The eGFR has been calculated using the CKD EPI equation.     This calculation has not been  validated in all clinical situations.     eGFR's persistently <90 mL/min signify possible Chronic Kidney     Disease.  PRO B NATRIURETIC PEPTIDE     Status: None   Collection Time    08/25/13  2:00 PM  Result Value Range   Pro B Natriuretic peptide (BNP) 99.1  0 - 125 pg/mL  POCT I-STAT TROPONIN I     Status: None   Collection Time    08/25/13  3:32 PM      Result Value Range   Troponin i, poc 0.01  0.00 - 0.08 ng/mL   Comment 3            Comment: Due to the release kinetics of cTnI,     a negative result within the first hours     of the onset of symptoms does not rule out     myocardial infarction with certainty.     If myocardial infarction is still suspected,     repeat the test at appropriate intervals.    Dg Chest 2 View  08/25/2013   *RADIOLOGY REPORT*  Clinical Data: Chest pain  CHEST - 2 VIEW  Comparison: 08/07/2013  Findings: Borderline cardiomegaly.  Central mild vascular congestion.  Probable chronic mild interstitial prominence without convincing pulmonary edema.  Mild degenerative changes thoracic spine.  No segmental infiltrate.  IMPRESSION: No segmental infiltrate.  Central mild vascular congestion. Probable chronic mild interstitial prominence without convincing pulmonary edema.   Original Report Authenticated By: Natasha Mead, M.D.    Review of Systems  Constitutional: Negative for diaphoresis.  Respiratory: Negative for shortness of breath.   Cardiovascular: Positive for chest pain.  Gastrointestinal: Positive for heartburn. Negative for nausea and vomiting.  Neurological: Negative for loss of consciousness.  All other systems reviewed and are negative.   Blood pressure 131/91, pulse 56, temperature 97.5 F (36.4 C), temperature source Oral, resp. rate 12, SpO2 93.00%. Physical Exam  Constitutional: He is oriented to person, place, and time. He appears well-developed and well-nourished. No distress.  Neck: No JVD present. Carotid bruit is not present.   Cardiovascular: Normal rate, regular rhythm and intact distal pulses.  Exam reveals friction rub. Exam reveals no gallop.   No murmur heard. Pulses:      Radial pulses are 2+ on the right side, and 2+ on the left side.       Dorsalis pedis pulses are 2+ on the right side, and 2+ on the left side.  Respiratory: Effort normal and breath sounds normal. No respiratory distress. He has no wheezes. He has no rales. He exhibits no tenderness.  GI: Soft. Bowel sounds are normal. He exhibits no distension and no mass. There is no tenderness.  Musculoskeletal: He exhibits no edema.  Neurological: He is alert and oriented to person, place, and time.  Skin: Skin is warm and dry. He is not diaphoretic. No erythema.  Psychiatric: He has a normal mood and affect. His behavior is normal.    Assessment/Plan: Principal Problem:   Chest pain with low risk for cardiac etiology Active Problems:   CAD- s/p PCI w/ DES to circumflex, and RCA in 2012- patent stents on re-look cath 08/10/13   HTN (hypertension)   Plan: Pt is currently CP free. EKG is normal. Recent cath 2 weeks ago demonstrated patent previously placed stents and no other CAD. LV function was normal. Dr. Allyson Sabal has reviewed cath films and agrees that vessels are patent and free of disease. Will order POC troponin in ER. If normal, he can be discharged home. Will add 30 mg of Imdur daily. He is already scheduled to follow up with Dr. Tresa Endo on 09/03/13.    Allayne Butcher, PA-C 08/25/2013, 5:33 PM     Agree with note  written by Boyce Medici  PAC  Atypical CP with recent cath showing patent stents and otherwise no signif CAD. EKG w/o acute changes. Enz neg. Exam benign. Doubt pain is cardiac. Will start PO long acting nitrate and D/C pt home. He already has an appointment scheduled with Dr. Tresa Endo for 9/18.  Runell Gess 08/26/2013 6:44 AM

## 2013-08-25 NOTE — ED Provider Notes (Signed)
Medical screening examination/treatment/procedure(s) were performed by non-physician practitioner and as supervising physician I was immediately available for consultation/collaboration.  Medha Pippen N Gwynevere Lizana, DO 08/25/13 2013 

## 2013-08-25 NOTE — ED Notes (Signed)
Pt states at 0900 started having midsternal discomfort and took med for indigestion with no relief and then spread under breast.  Pt reports reports SOB.  Pt has had 2 previous cardiac stent and was here 2 weeks ago for chest pain

## 2013-08-25 NOTE — ED Notes (Signed)
Patient states he had central chest pain started 9am.  Pt took 2 nitro and meds for acid reflux prilosec.  Pain has resolved since arrival in ED.   Pt hx of stents feb 2012.  Denies N. V and sweatsPt alert oriented X4

## 2013-08-25 NOTE — ED Notes (Signed)
Notified pharmacy to send Imdur

## 2013-08-25 NOTE — ED Provider Notes (Signed)
CSN: [id number]     Arrival date & time 08/25/13  1351 History   First MD Initiated Contact with Patient 08/25/13 1408     Chief Complaint  Patient presents with  . Chest Pain   (Consider location/radiation/quality/duration/timing/severity/associated sxs/prior Treatment) HPI Comments: Patient is a 62 y/o male with a hx of ACS s/p cardiac stenting x 2 (RCA and circumflex in 2012) with unremarkable cardiac catheterization 2 weeks ago who presents for chest pain with onset at 9AM today. Patient states that pain was central and substernal, radiating between his left and right lower chest, and worse with exertion. Patient took OTC antacid as well as NTG x 1 without relief. He endorses associated SOB and denies associated vision changes, jaw pain, diaphoresis, N/V, numbness/tingling, and extremity weakness. Patient states symptoms spontaneously resolved 1 hour ago; He continues to be asymptomatic at this time.  Cardiologist - Dr. Nicki Guadalajara  Patient is a 62 y.o. male presenting with chest pain. The history is provided by the patient. No language interpreter was used.  Chest Pain Associated symptoms: no diaphoresis, no fever, no nausea, no numbness, no shortness of breath, not vomiting and no weakness     Past Medical History  Diagnosis Date  . Hypertension   . MI (myocardial infarction)    Past Surgical History  Procedure Laterality Date  . Coronary stent placement    . Knee surgery     Family History  Problem Relation Age of Onset  . Hypertension Father    History  Substance Use Topics  . Smoking status: Never Smoker   . Smokeless tobacco: Not on file  . Alcohol Use: No    Review of Systems  Constitutional: Negative for fever and diaphoresis.  Respiratory: Negative for shortness of breath.   Cardiovascular: Positive for chest pain.  Gastrointestinal: Negative for nausea and vomiting.  Neurological: Negative for weakness and numbness.  All other systems reviewed and are  negative.    Allergies  Review of patient's allergies indicates no known allergies.  Home Medications   Current Outpatient Rx  Name  Route  Sig  Dispense  Refill  . Ascorbic Acid (VITAMIN C CR) 1500 MG TBCR   Oral   Take 1 tablet by mouth daily as needed (for vitamin).          Marland Kitchen aspirin EC 81 MG EC tablet   Oral   Take 1 tablet (81 mg total) by mouth daily.         Marland Kitchen atorvastatin (LIPITOR) 80 MG tablet   Oral   Take 1 tablet (80 mg total) by mouth daily at 6 PM.   30 tablet   5   . calcium citrate (CALCITRATE - DOSED IN MG ELEMENTAL CALCIUM) 950 MG tablet   Oral   Take 1 tablet by mouth daily as needed (for vitamin).         . carvedilol (COREG) 25 MG tablet   Oral   Take 1 tablet (25 mg total) by mouth 2 (two) times daily with a meal.   60 tablet   5   . CINNAMON PO   Oral   Take 1,000 mg by mouth daily as needed (for vitamin).         . Coenzyme Q10 (COQ10) 100 MG CAPS   Oral   Take 100 mg by mouth daily as needed (for vitamin).         . Folic Acid-Vit B6-Vit B12 (HOMOCYSTEINE FORMULA) 0.8-50-0.1 MG TABS   Oral  Take 1 tablet by mouth daily as needed (for vitamin).         . irbesartan (AVAPRO) 150 MG tablet   Oral   Take 1 tablet (150 mg total) by mouth daily.   30 tablet   5   . L-Lysine 1000 MG TABS   Oral   Take 1 tablet by mouth daily as needed (for vitamin).         . Multiple Vitamin (MULTIVITAMIN WITH MINERALS) TABS tablet   Oral   Take 1 tablet by mouth daily.         . nitroGLYCERIN (NITROSTAT) 0.4 MG SL tablet   Sublingual   Place 0.4 mg under the tongue every 5 (five) minutes as needed for chest pain. x3 doses as needed for chest pain         . OVER THE COUNTER MEDICATION   Oral   Take 1 capsule by mouth daily as needed ("Arte-clear"   5mg /35mg    for vitamin).         Marland Kitchen OVER THE COUNTER MEDICATION   Oral   Take 1 tablet by mouth daily as needed ("Vasotensin"    for vitamin).         Marland Kitchen OVER THE COUNTER  MEDICATION   Oral   Take 1 tablet by mouth daily as needed ("Cardioblend"       for vitamin).         . Phytosterol Esters (PHYTOSTEROL COMPLEX) 500 MG CAPS   Oral   Take 1,000 mg by mouth daily as needed (for vitamin).         . isosorbide mononitrate (IMDUR) 30 MG 24 hr tablet   Oral   Take 1 tablet (30 mg total) by mouth daily.   30 tablet   0    BP 131/91  Pulse 56  Temp(Src) 97.5 F (36.4 C) (Oral)  Resp 12  SpO2 93%  Physical Exam  Nursing note and vitals reviewed. Constitutional: He is oriented to person, place, and time. He appears well-developed and well-nourished. No distress.  HENT:  Head: Normocephalic and atraumatic.  Mouth/Throat: Oropharynx is clear and moist. No oropharyngeal exudate.  Eyes: Conjunctivae and EOM are normal. Pupils are equal, round, and reactive to light. No scleral icterus.  Neck: Normal range of motion.  Cardiovascular: Normal rate, regular rhythm, normal heart sounds and intact distal pulses.   Pulmonary/Chest: Effort normal and breath sounds normal. No respiratory distress. He has no wheezes. He has no rales.  Abdominal: Soft. He exhibits no distension. There is no tenderness.  Musculoskeletal: Normal range of motion.  Neurological: He is alert and oriented to person, place, and time.  Skin: Skin is warm and dry. No rash noted. He is not diaphoretic. No erythema. No pallor.  Psychiatric: He has a normal mood and affect. His behavior is normal.    ED Course  Procedures (including critical care time) Labs Review Labs Reviewed  CBC - Abnormal; Notable for the following:    MCHC 36.3 (*)    All other components within normal limits  BASIC METABOLIC PANEL - Abnormal; Notable for the following:    Glucose, Bld 115 (*)    GFR calc non Af Amer 76 (*)    GFR calc Af Amer 88 (*)    All other components within normal limits  PRO B NATRIURETIC PEPTIDE  POCT I-STAT TROPONIN I   Imaging Review Dg Chest 2 View  08/25/2013   *RADIOLOGY  REPORT*  Clinical Data: Chest pain  CHEST - 2 VIEW  Comparison: 08/07/2013  Findings: Borderline cardiomegaly.  Central mild vascular congestion.  Probable chronic mild interstitial prominence without convincing pulmonary edema.  Mild degenerative changes thoracic spine.  No segmental infiltrate.  IMPRESSION: No segmental infiltrate.  Central mild vascular congestion. Probable chronic mild interstitial prominence without convincing pulmonary edema.   Original Report Authenticated By: Natasha Mead, M.D.    Date: 08/25/2013  Rate: 58  Rhythm: sinus bradycardia  QRS Axis: left  Intervals: normal  ST/T Wave abnormalities: normal  Conduction Disutrbances:nonspecific intraventricular conduction delay  Narrative Interpretation: Sinus brady with NSIVCD; no STEMI  Old EKG Reviewed: unchanged from 08/09/2013 I have personally reviewed and interpreted this EKG  MDM   1. Atypical chest pain    Patient with hx of ACS with stent placement x 2 in 2012 and clean cardiac catheterization 2 weeks ago presents for atypical chest pain. Asymptomatic since arrival and for duration of ED stay. Work up today unremarkable and Troponin 0.01. Patient followed by Dr. Nicki Guadalajara and was evaluated by [facility] today who has cleared the patient for discharge with outpatient follow up. Imdur 30mg  QD prescribed for patient as recommended by cardiology to add to current regimen. Return precautions advised and patient agreeable to plan with no unaddressed concerns.    Antony Madura, PA-C 08/25/13 1746

## 2013-08-25 NOTE — ED Notes (Signed)
Cardiology at bedside.

## 2013-08-31 ENCOUNTER — Encounter: Payer: Self-pay | Admitting: *Deleted

## 2013-09-03 ENCOUNTER — Other Ambulatory Visit: Payer: Self-pay | Admitting: Physician Assistant

## 2013-09-03 ENCOUNTER — Encounter: Payer: Self-pay | Admitting: Physician Assistant

## 2013-09-03 ENCOUNTER — Ambulatory Visit (INDEPENDENT_AMBULATORY_CARE_PROVIDER_SITE_OTHER): Payer: 59 | Admitting: Physician Assistant

## 2013-09-03 VITALS — BP 118/70 | HR 66 | Ht 70.0 in | Wt 236.4 lb

## 2013-09-03 DIAGNOSIS — E669 Obesity, unspecified: Secondary | ICD-10-CM | POA: Insufficient documentation

## 2013-09-03 DIAGNOSIS — I10 Essential (primary) hypertension: Secondary | ICD-10-CM

## 2013-09-03 DIAGNOSIS — I2510 Atherosclerotic heart disease of native coronary artery without angina pectoris: Secondary | ICD-10-CM

## 2013-09-03 MED ORDER — ISOSORBIDE MONONITRATE ER 30 MG PO TB24: 30.0000 mg | ORAL_TABLET | Freq: Every day | ORAL | Status: DC

## 2013-09-03 NOTE — Assessment & Plan Note (Signed)
Patient is reports that he and his wife have started juicing.  I've encouraged him to continue to do so and also gave him some additional resources to help in weight management.

## 2013-09-03 NOTE — Patient Instructions (Signed)
Check BP during periods of dizziness.  Follow up with Dr. Tresa Endo in 6 months.

## 2013-09-03 NOTE — Progress Notes (Signed)
Date:  09/03/2013   ID:  Joneen Caraway, DOB 1951/06/15, MRN [id number]  PCP:  No PCP Per Patient  Primary Cardiologist:  Tresa Endo    History of Present Illness: BEVERLY SURIANO is a 62 y.o. male followed by Dr. Tresa Endo, who, in February 2012, suffered an acute coronary syndrome. Emergent catheterization on February 06, 2011, showed 95% proximal circumflex stenosis as well as 90% RCA stenosis proximally. In addition, he had 60% stenosis of the OM-1 branch of the circumflex and 40% LAD stenosis. A 3.5 x 16 mm DE Promus stent was inserted in the circumflex, post dilated to 4.0 mm acutely. Three days later, he underwent staged intervention to his 90% to 95% proximal RCA stenosis and has been on medical therapy for concomitant CAD. The patient underwent a 1-year followup nuclear perfusion study to assess for late restenosis following his acute coronary syndrome. This was done on February 19, 2012, and showed fairly normal perfusion without scar or ischemia. Post stress ejection fraction was 61%. Additional problems include hypertension, hyperlipidemia.   He was recently hospitalized 2 weeks ago for evaluation of chest pain. He ruled out for MI. He underwent a diagnostic LHC by Dr. Tresa Endo on 08/10/13 which demonstrated no significant residual CAD with normal LAD, widely patent previously placed stents in the proximal circumflex and right coronary artery with evidence for mild luminal irregularity of 20% narrowing in the mid AV groove circumflex. He had normal LV function with an EF of 65%. He was discharge home on a BB, an ARB, ASA and statin.   He returned to the [facility] ER on 08/25/13 with a complaint of chest pain. He had been in his normal state of health when he developed substernal chest pain. The pain was very different from the pain he has had with his previous stents, which was more pressure-like pain. The pain was a dull ache and did not radiate and was not worsened with exertion. He had no other symptoms. He  thought initially that it was indigestion. He took both an antacid and 1 SL NGT around the same time, so is unsure which relieved this discomfort. By the time he arrived to the ED, he was chest pain free. His EKG is w/o acute change.   He presents to the clinic for follow up.  He reports no further episodes of chest pain.  He does experience some occasional light lightheadedness with exertion.  He otherwise denies nausea, vomiting, fever, shortness of breath, orthopnea, PND, cough, congestion, abdominal pain  Wt Readings from Last 3 Encounters:  09/03/13 236 lb 6.4 oz (107.23 kg)  08/08/13 236 lb 15.9 oz (107.5 kg)  08/08/13 236 lb 15.9 oz (107.5 kg)     Past Medical History  Diagnosis Date  . Hypertension   . MI (myocardial infarction)   . Hyperlipidemia     Current Outpatient Prescriptions  Medication Sig Dispense Refill  . Ascorbic Acid (VITAMIN C CR) 1500 MG TBCR Take 1 tablet by mouth daily as needed (for vitamin).       Marland Kitchen aspirin EC 81 MG EC tablet Take 1 tablet (81 mg total) by mouth daily.      Marland Kitchen atorvastatin (LIPITOR) 80 MG tablet Take 1 tablet (80 mg total) by mouth daily at 6 PM.  30 tablet  5  . calcium citrate (CALCITRATE - DOSED IN MG ELEMENTAL CALCIUM) 950 MG tablet Take 1 tablet by mouth daily as needed (for vitamin).      . carvedilol (COREG)  25 MG tablet Take 1 tablet (25 mg total) by mouth 2 (two) times daily with a meal.  60 tablet  5  . CINNAMON PO Take 1,000 mg by mouth daily as needed (for vitamin).      . Coenzyme Q10 (COQ10) 100 MG CAPS Take 100 mg by mouth daily as needed (for vitamin).      . irbesartan (AVAPRO) 150 MG tablet Take 1 tablet (150 mg total) by mouth daily.  30 tablet  5  . isosorbide mononitrate (IMDUR) 30 MG 24 hr tablet Take 1 tablet (30 mg total) by mouth daily.  30 tablet  0  . L-Lysine 1000 MG TABS Take 1 tablet by mouth daily as needed (for vitamin).      . Multiple Vitamin (MULTIVITAMIN WITH MINERALS) TABS tablet Take 1 tablet by mouth  daily.      . nitroGLYCERIN (NITROSTAT) 0.4 MG SL tablet Place 0.4 mg under the tongue every 5 (five) minutes as needed for chest pain. x3 doses as needed for chest pain      . OVER THE COUNTER MEDICATION Take 1 capsule by mouth daily as needed ("Arte-clear"   5mg /35mg    for vitamin).      Marland Kitchen OVER THE COUNTER MEDICATION Take 1 tablet by mouth daily as needed ("Cardioblend"       for vitamin).      . Phytosterol Esters (PHYTOSTEROL COMPLEX) 500 MG CAPS Take 1,000 mg by mouth daily as needed (for vitamin).       No current facility-administered medications for this visit.    Allergies:   No Known Allergies  Social History:  The patient  reports that he quit smoking about 40 years ago. He has never used smokeless tobacco. He reports that he does not drink alcohol or use illicit drugs.   Family history:   Family History  Problem Relation Age of Onset  . Hypertension Father     ROS:  Please see the history of present illness.  All other systems reviewed and negative.   PHYSICAL EXAM: VS:  BP 118/70  Pulse 66  Ht 5\' 10"  (1.778 m)  Wt 236 lb 6.4 oz (107.23 kg)  BMI 33.92 kg/m2 Well nourished, well developed, in no acute distress HEENT: Pupils are equal round react to light accommodation extraocular movements are intact.  Neck: no JVDNo cervical lymphadenopathy. Cardiac: Regular rate and rhythm without murmurs rubs or gallops. Lungs:  clear to auscultation bilaterally, no wheezing, rhonchi or rales Ext: no lower extremity edema.  2+ radial and dorsalis pedis pulses. Skin: warm and dry Neuro:  Grossly normal  EKG:    Normal sinus rhythm rate 66 beats per minute, right bundle branch block.  ASSESSMENT AND PLAN:  Problem List Items Addressed This Visit   Obesity (BMI 30-39.9)     Patient is reports that he and his wife have started juicing.  I've encouraged him to continue to do so and also gave him some additional resources to help in weight management.      HTN (hypertension)      Blood pressure is well-controlled at this time    CAD- s/p PCI w/ DES to circumflex, and RCA in 2012- patent stents on re-look cath 08/10/13 - Primary     No further episodes of chest pain since discharge on 08/25/13.  He was put back on Imdur 30 mg. We'll go ahead and refill this medication for him today.   He has reported some occasional mild lightheadedness. I have asked  him to check his blood pressure when he has these episodes and see if he is hypotensive.

## 2013-09-03 NOTE — Assessment & Plan Note (Signed)
No further episodes of chest pain since discharge on 08/25/13.  He was put back on Imdur 30 mg. We'll go ahead and refill this medication for him today.   He has reported some occasional mild lightheadedness. I have asked him to check his blood pressure when he has these episodes and see if he is hypotensive.

## 2013-09-03 NOTE — Assessment & Plan Note (Signed)
Blood pressure is well-controlled at this time. 

## 2013-10-12 ENCOUNTER — Emergency Department (HOSPITAL_COMMUNITY)
Admission: EM | Admit: 2013-10-12 | Discharge: 2013-10-12 | Disposition: A | Discharge status: Home / Self Care | Payer: 59 | Attending: Emergency Medicine | Admitting: Emergency Medicine

## 2013-10-12 ENCOUNTER — Emergency Department (HOSPITAL_COMMUNITY): Payer: 59

## 2013-10-12 ENCOUNTER — Encounter (HOSPITAL_COMMUNITY): Payer: Self-pay | Admitting: Emergency Medicine

## 2013-10-12 DIAGNOSIS — W312XXA Contact with powered woodworking and forming machines, initial encounter: Secondary | ICD-10-CM | POA: Insufficient documentation

## 2013-10-12 DIAGNOSIS — S61209A Unspecified open wound of unspecified finger without damage to nail, initial encounter: Secondary | ICD-10-CM | POA: Insufficient documentation

## 2013-10-12 DIAGNOSIS — Y9389 Activity, other specified: Secondary | ICD-10-CM | POA: Insufficient documentation

## 2013-10-12 DIAGNOSIS — S61213A Laceration without foreign body of left middle finger without damage to nail, initial encounter: Secondary | ICD-10-CM

## 2013-10-12 DIAGNOSIS — I252 Old myocardial infarction: Secondary | ICD-10-CM | POA: Insufficient documentation

## 2013-10-12 DIAGNOSIS — Z23 Encounter for immunization: Secondary | ICD-10-CM | POA: Insufficient documentation

## 2013-10-12 DIAGNOSIS — Z9861 Coronary angioplasty status: Secondary | ICD-10-CM | POA: Insufficient documentation

## 2013-10-12 DIAGNOSIS — I10 Essential (primary) hypertension: Secondary | ICD-10-CM | POA: Insufficient documentation

## 2013-10-12 DIAGNOSIS — Y92009 Unspecified place in unspecified non-institutional (private) residence as the place of occurrence of the external cause: Secondary | ICD-10-CM | POA: Insufficient documentation

## 2013-10-12 DIAGNOSIS — Z79899 Other long term (current) drug therapy: Secondary | ICD-10-CM | POA: Insufficient documentation

## 2013-10-12 DIAGNOSIS — Z87891 Personal history of nicotine dependence: Secondary | ICD-10-CM | POA: Insufficient documentation

## 2013-10-12 DIAGNOSIS — S61215A Laceration without foreign body of left ring finger without damage to nail, initial encounter: Secondary | ICD-10-CM

## 2013-10-12 DIAGNOSIS — Z7982 Long term (current) use of aspirin: Secondary | ICD-10-CM | POA: Insufficient documentation

## 2013-10-12 DIAGNOSIS — E785 Hyperlipidemia, unspecified: Secondary | ICD-10-CM | POA: Insufficient documentation

## 2013-10-12 MED ORDER — MORPHINE SULFATE 4 MG/ML IJ SOLN
4.0000 mg | Freq: Once | INTRAMUSCULAR | Status: DC
  Filled 2013-10-12: qty 1

## 2013-10-12 MED ORDER — MORPHINE SULFATE 4 MG/ML IJ SOLN
4.0000 mg | Freq: Once | INTRAMUSCULAR | Status: AC
  Administered 2013-10-12: 4 mg via INTRAMUSCULAR

## 2013-10-12 MED ORDER — CEPHALEXIN 500 MG PO CAPS: 500.0000 mg | ORAL_CAPSULE | Freq: Four times a day (QID) | ORAL | Status: DC

## 2013-10-12 MED ORDER — TETANUS-DIPHTH-ACELL PERTUSSIS 5-2.5-18.5 LF-MCG/0.5 IM SUSP
0.5000 mL | Freq: Once | INTRAMUSCULAR | Status: AC
  Administered 2013-10-12: 0.5 mL via INTRAMUSCULAR
  Filled 2013-10-12: qty 0.5

## 2013-10-12 MED ORDER — HYDROMORPHONE HCL PF 1 MG/ML IJ SOLN: 1.0000 mg | Freq: Once | INTRAMUSCULAR | Status: DC

## 2013-10-12 MED ORDER — HYDROCODONE-ACETAMINOPHEN 5-325 MG PO TABS: 1.0000 | ORAL_TABLET | Freq: Four times a day (QID) | ORAL | Status: DC | PRN

## 2013-10-12 NOTE — ED Notes (Signed)
Hand surgeon at bedside. 

## 2013-10-12 NOTE — ED Provider Notes (Signed)
CSN: [id number]     Arrival date & time 10/12/13  1515 History   First MD Initiated Contact with Patient 10/12/13 1600     No chief complaint on file.  (Consider location/radiation/quality/duration/timing/severity/associated sxs/prior Treatment) Patient is a 62 y.o. male presenting with hand injury. The history is provided by the patient.  Hand Injury Location:  Finger Injury: yes   Mechanism of injury comment:  Table saw Finger location:  L middle finger and L ring finger Pain details:    Quality:  Throbbing and sharp   Radiates to:  Does not radiate   Severity:  Severe   Onset quality:  Sudden   Timing:  Constant   Progression:  Unchanged Chronicity:  New Handedness:  Right-handed Foreign body present:  Unable to specify Tetanus status:  Out of date Prior injury to area:  No Associated symptoms: no fever     Past Medical History  Diagnosis Date  . Hypertension   . MI (myocardial infarction)   . Hyperlipidemia    Past Surgical History  Procedure Laterality Date  . Coronary stent placement    . Knee surgery    . Doppler echocardiography  02/07/2011    EF 50 to 55%  mild -mod posterior wall hypokinesis,pulm vein flow pattern normal    . Nm myocar perf wall motion  02/19/2012    EF 61% ,NO SIGNIFICANT WALL MOTION ABNORMALITIES NOTED  . Cardiac catheterization  02/06/2011    normal LV; CAD 40% mid left  LADafter thsecond diagonal;90-95% culprit stenosis prox.circ ;80-90% stenosis prox RCA.  Marland Kitchen Coronary angioplasty  02/06/2011    large dominant left circ sytem treated w/cutting balloon arthrotomy,stenting with 3.5 x 16 mm eerolimus Promus stent,postdilated to 4.0 mm  . Bladder suspension    . Cardiac catheterization  11/24 /2003    normal cors   Family History  Problem Relation Age of Onset  . Hypertension Father    History  Substance Use Topics  . Smoking status: Former Smoker    Quit date: 08/31/1973  . Smokeless tobacco: Never Used  . Alcohol Use: No     Review of Systems  Constitutional: Negative for fever.  Respiratory: Negative for cough and shortness of breath.   Gastrointestinal: Negative for vomiting.  All other systems reviewed and are negative.    Allergies  Review of patient's allergies indicates no known allergies.  Home Medications   Current Outpatient Rx  Name  Route  Sig  Dispense  Refill  . aspirin EC 81 MG EC tablet   Oral   Take 1 tablet (81 mg total) by mouth daily.         Marland Kitchen atorvastatin (LIPITOR) 80 MG tablet   Oral   Take 1 tablet (80 mg total) by mouth daily at 6 PM.   30 tablet   5   . carvedilol (COREG) 25 MG tablet   Oral   Take 1 tablet (25 mg total) by mouth 2 (two) times daily with a meal.   60 tablet   5   . irbesartan (AVAPRO) 150 MG tablet   Oral   Take 1 tablet (150 mg total) by mouth daily.   30 tablet   5   . isosorbide mononitrate (IMDUR) 30 MG 24 hr tablet   Oral   Take 1 tablet (30 mg total) by mouth daily.   30 tablet   5   . nitroGLYCERIN (NITROSTAT) 0.4 MG SL tablet   Sublingual   Place 0.4 mg under the  tongue every 5 (five) minutes as needed for chest pain. x3 doses as needed for chest pain          BP 126/90  Pulse 68  Temp(Src) 97.9 F (36.6 C) (Oral)  Resp 18  SpO2 97% Physical Exam  Nursing note and vitals reviewed. Constitutional: He is oriented to person, place, and time. He appears well-developed and well-nourished. No distress.  HENT:  Head: Normocephalic and atraumatic.  Mouth/Throat: No oropharyngeal exudate.  Eyes: EOM are normal. Pupils are equal, round, and reactive to light.  Neck: Normal range of motion. Neck supple.  Cardiovascular: Normal rate and regular rhythm.  Exam reveals no friction rub.   No murmur heard. Pulmonary/Chest: Effort normal and breath sounds normal. No respiratory distress. He has no wheezes. He has no rales.  Abdominal: He exhibits no distension. There is no tenderness. There is no rebound.  Musculoskeletal: Normal  range of motion. He exhibits no edema.       Hands: Neurological: He is alert and oriented to person, place, and time.  Skin: He is not diaphoretic.    ED Course  LACERATION REPAIR Date/Time: 10/12/2013 11:17 PM Performed by: Dagmar Hait Authorized by: Dagmar Hait Consent: Verbal consent obtained. Body area: upper extremity Location details: right long finger Laceration length: 1 cm Foreign bodies: no foreign bodies Tendon involvement: none Nerve involvement: none Vascular damage: no Anesthesia: local infiltration and digital block Local anesthetic: lidocaine 2% with epinephrine Anesthetic total: 12 ml Patient sedated: no Preparation: Patient was prepped and draped in the usual sterile fashion. Irrigation solution: saline Irrigation method: jet lavage Amount of cleaning: standard Debridement: none Degree of undermining: none Subcutaneous closure: 5-0 Vicryl Technique: simple Approximation: close Approximation difficulty: simple Patient tolerance: Patient tolerated the procedure well with no immediate complications.   (including critical care time) Labs Review Labs Reviewed - No data to display Imaging Review No results found.  EKG Interpretation   None       MDM   1. Laceration of left middle finger without foreign body without damage to nail, initial encounter   2. Laceration of fourth finger of left hand, initial encounter    34M here with finger injury from saw use. Happened at home. Sent here from urgent care. Right hand dominant. AFVSS here. L distal 3rd finger with tip avulsion, pad is deeply lacerated. Mild numbness laterally at tip of 3rd finger, otherwise NVI. L 4th fingertip has nailbed injury with laceration through distal nailbed. Neurovascularly intact. Tetanus updated here. Pain meds given. Will xray.  Xrays normal. I placed a few sutures, however once starting, I found the finger lac to be extremely complicated. Dr. Mina Marble with  Hand Surgery saw patient and finished suturing his lacerations. Keflex and pain meds given.    Dagmar Hait, MD 10/12/13 [phone]

## 2013-10-12 NOTE — ED Notes (Signed)
Pt reports working with wood and accidentally cut two fingers with table saw.

## 2013-10-12 NOTE — ED Notes (Signed)
Bed: ZO10 Expected date:  Expected time:  Means of arrival:  Comments: Hold for Fasttrack 5

## 2013-10-12 NOTE — Consult Note (Signed)
Reason for Consult:left long and ring distal lacerations Referring Physician: [name] is an 62 y.o. male.  HPI: s/p tablesaw injury to left long and ing fingers distally  Past Medical History  Diagnosis Date  . Hypertension   . MI (myocardial infarction)   . Hyperlipidemia     Past Surgical History  Procedure Laterality Date  . Coronary stent placement    . Knee surgery    . Doppler echocardiography  02/07/2011    EF 50 to 55%  mild -mod posterior wall hypokinesis,pulm vein flow pattern normal    . Nm myocar perf wall motion  02/19/2012    EF 61% ,NO SIGNIFICANT WALL MOTION ABNORMALITIES NOTED  . Cardiac catheterization  02/06/2011    normal LV; CAD 40% mid left  LADafter thsecond diagonal;90-95% culprit stenosis prox.circ ;80-90% stenosis prox RCA.  Marland Kitchen Coronary angioplasty  02/06/2011    large dominant left circ sytem treated w/cutting balloon arthrotomy,stenting with 3.5 x 16 mm eerolimus Promus stent,postdilated to 4.0 mm  . Bladder suspension    . Cardiac catheterization  11/24 /2003    normal cors    Family History  Problem Relation Age of Onset  . Hypertension Father     Social History:  reports that he quit smoking about 40 years ago. He has never used smokeless tobacco. He reports that he does not drink alcohol or use illicit drugs.  Allergies: No Known Allergies  Medications: Scheduled:  No results found for this or any previous visit (from the past 48 hour(s)).  Dg Finger Middle Left  10/12/2013   CLINICAL DATA:  Cut middle and ring finger with table saw today.  EXAM: LEFT RING FINGER 2+V; LEFT MIDDLE FINGER 2+V  COMPARISON:  None.  FINDINGS: There is no evidence of fracture or dislocation. There is soft tissue irregularity in the distal aspect of the left 3rd and 4th digits without radiopaque foreign body consistent with patient's known laceration.  IMPRESSION: No evidence of fracture or dislocation. There is soft tissue irregularity in the  distal aspect of the left 3rd and 4th digits without radiopaque foreign body consistent with patient's known laceration.   Electronically Signed   By: Mario Torres M.D.   On: 10/12/2013 17:13   Dg Finger Ring Left  10/12/2013   CLINICAL DATA:  Cut middle and ring finger with table saw today.  EXAM: LEFT RING FINGER 2+V; LEFT MIDDLE FINGER 2+V  COMPARISON:  None.  FINDINGS: There is no evidence of fracture or dislocation. There is soft tissue irregularity in the distal aspect of the left 3rd and 4th digits without radiopaque foreign body consistent with patient's known laceration.  IMPRESSION: No evidence of fracture or dislocation. There is soft tissue irregularity in the distal aspect of the left 3rd and 4th digits without radiopaque foreign body consistent with patient's known laceration.   Electronically Signed   By: Mario Torres M.D.   On: 10/12/2013 17:13    Review of Systems  All other systems reviewed and are negative.   Blood pressure 166/89, pulse 70, temperature 97.9 F (36.6 C), temperature source Oral, resp. rate 18, SpO2 100.00%. Physical Exam  Constitutional: He is oriented to person, place, and time. He appears well-developed and well-nourished.  HENT:  Head: Normocephalic and atraumatic.  Cardiovascular: Normal rate.   Respiratory: Effort normal.  Musculoskeletal:       Left hand: He exhibits tenderness, deformity and laceration.  Left long and ring tablesaw complex lacerations  Neurological: He  is alert and oriented to person, place, and time.  Skin: Skin is warm.  Psychiatric: He has a normal mood and affect. His behavior is normal. Judgment and thought content normal.    Assessment/Plan: As above  Digital blocks performed at bedside and primary closure with 4-0 rapide done at bedside  followup in my office this thursday  [facility] A 10/12/2013, 7:59 PM

## 2013-10-21 ENCOUNTER — Other Ambulatory Visit: Payer: Self-pay | Admitting: Orthopedic Surgery

## 2013-10-21 DIAGNOSIS — M25512 Pain in left shoulder: Secondary | ICD-10-CM

## 2013-10-31 ENCOUNTER — Ambulatory Visit
Admission: RE | Admit: 2013-10-31 | Discharge: 2013-10-31 | Disposition: A | Discharge status: Home / Self Care | Payer: 59 | Source: Ambulatory Visit | Attending: Orthopedic Surgery | Admitting: Orthopedic Surgery

## 2013-10-31 DIAGNOSIS — M25512 Pain in left shoulder: Secondary | ICD-10-CM

## 2014-06-03 ENCOUNTER — Ambulatory Visit (INDEPENDENT_AMBULATORY_CARE_PROVIDER_SITE_OTHER): Payer: 59 | Admitting: Cardiology

## 2014-06-03 ENCOUNTER — Encounter: Payer: Self-pay | Admitting: Cardiology

## 2014-06-03 VITALS — BP 172/108 | HR 77 | Ht 70.0 in | Wt 235.6 lb

## 2014-06-03 DIAGNOSIS — Z9119 Patient's noncompliance with other medical treatment and regimen: Secondary | ICD-10-CM

## 2014-06-03 DIAGNOSIS — E785 Hyperlipidemia, unspecified: Secondary | ICD-10-CM

## 2014-06-03 DIAGNOSIS — Z91199 Patient's noncompliance with other medical treatment and regimen due to unspecified reason: Secondary | ICD-10-CM

## 2014-06-03 DIAGNOSIS — I10 Essential (primary) hypertension: Secondary | ICD-10-CM

## 2014-06-03 MED ORDER — LISINOPRIL 10 MG PO TABS: 10.0000 mg | ORAL_TABLET | Freq: Every day | ORAL | Status: DC

## 2014-06-03 MED ORDER — ROSUVASTATIN CALCIUM 5 MG PO TABS: 5.0000 mg | ORAL_TABLET | Freq: Every day | ORAL | Status: DC

## 2014-06-03 MED ORDER — HYDROCHLOROTHIAZIDE 12.5 MG PO CAPS: 12.5000 mg | ORAL_CAPSULE | Freq: Every day | ORAL | Status: DC

## 2014-06-03 NOTE — Assessment & Plan Note (Signed)
He has stopped all his medications secondary to fatigue and other side effects 

## 2014-06-03 NOTE — Assessment & Plan Note (Signed)
No angina 

## 2014-06-03 NOTE — Assessment & Plan Note (Signed)
Uncontrolled 

## 2014-06-03 NOTE — Patient Instructions (Signed)
Corine ShelterLuke Kilroy, PA-C has recommended making the following medication changes:  START Lisinopril 10 mg - take 1 tablet daily  START Hydrochlorothiazide 12.5 mg - take 1 tablet daily  START Crestor 5 mg - take 1 tablet daily  START Aspirin 81 mg - take 1 tablet daily  Franky MachoLuke has recommended that you eat 1 banana a day.  Your physician recommends that you schedule a follow-up appointment in 1 month.

## 2014-06-03 NOTE — Assessment & Plan Note (Signed)
Previous LDL > 160

## 2014-06-03 NOTE — Assessment & Plan Note (Deleted)
He has stopped all his medications secondary to fatigue and other side effects

## 2014-06-03 NOTE — Progress Notes (Signed)
06/03/2014 Mario Torres   1951-09-22  [id number]  Primary Physicia No PCP Per Patient Primary Cardiologist: Dr Tresa EndoKelly  HPI:  Pt is a 63 y.o. male followed by Dr. Tresa EndoKelly, who, in February 2012, suffered an acute coronary syndrome. Emergent catheterization on February 06, 2011, showed 95% proximal circumflex stenosis as well as 90% RCA stenosis proximally. In addition, he had 60% stenosis of the OM-1 branch of the circumflex and 40% LAD stenosis. A 3.5 x 16 mm DE Promus stent was inserted in the circumflex. Three days later, he underwent staged intervention to his 90% to 95% proximal RCA stenosis. He was on medical therapy for concomitant CAD. The patient underwent a 1-year followup nuclear perfusion study to assess for late restenosis following his acute coronary syndrome. This was done on February 19, 2012, and showed fairly normal perfusion without scar or ischemia. Post stress ejection fraction was 61%. Additional problems include hypertension, hyperlipidemia.           He was Hospitalized  In Aug 2014  for evaluation of chest pain. He ruled out for MI. He underwent a diagnostic LHC by Dr. Tresa EndoKelly on 08/10/13 which demonstrated no significant residual CAD with normal LAD, widely patent previously placed stents in the proximal circumflex and right coronary artery with evidence for mild luminal irregularity of 20% narrowing in the mid AV groove circumflex. He had normal LV function with an EF of 65%.             [facility]13 with a complaint of chest pain. And was treated medically. We last saw him Sept 2014. Since then he has stopped all his medications. "They made me feel bad". Specifically he had fatigue and impotence. He felt like it was affecting his marriage. 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 He denies chest pain or SOB.               Current Outpatient Prescriptions  Medication Sig Dispense Refill  .  aspirin EC 81 MG tablet Take 81 mg by mouth daily.      . hydrochlorothiazide (MICROZIDE) 12.5 MG capsule Take 1 capsule (12.5 mg total) by mouth daily.  90 capsule  3  . lisinopril (PRINIVIL,ZESTRIL) 10 MG tablet Take 1 tablet (10 mg total) by mouth daily.  90 tablet  3  . rosuvastatin (CRESTOR) 5 MG tablet Take 1 tablet (5 mg total) by mouth daily.  14 tablet  0   No current facility-administered medications for this visit.    No Known Allergies  History   Social History  . Marital Status: Married    Spouse Name: N/A    Number of Children: N/A  . Years of Education: N/A   Occupational History  . Not on file.   Social History Main Topics  . Smoking status: Former Smoker    Quit date: 08/31/1973  . Smokeless tobacco: Never Used  . Alcohol Use: No  . Drug Use: No  . Sexual Activity: Not on file   Other Topics Concern  . Not on file   Social History Narrative  . No narrative on file     Review of Systems: General: negative for chills, fever, night sweats or weight changes.  Cardiovascular: negative for chest pain, dyspnea on exertion, edema, orthopnea, palpitations, paroxysmal nocturnal dyspnea or shortness of breath Dermatological: negative for rash Respiratory: negative for cough or  wheezing Urologic: negative for hematuria Abdominal: negative for nausea, vomiting, diarrhea, bright red blood per rectum, melena, or hematemesis Neurologic: negative for visual changes, syncope, or dizziness All other systems reviewed and are otherwise negative except as noted above.    Blood pressure 172/108, pulse 77, height 5\' 10"  (1.778 m), weight 235 lb 9.6 oz (106.867 kg).  General appearance: alert, cooperative, no distress and moderately obese Neck: no carotid bruit and no JVD Lungs: clear to auscultation bilaterally Heart: regular rate and rhythm Extremities: no edema    ASSESSMENT AND PLAN:   CAD- s/p PCI w/ DES to circumflex, and RCA in 2012- patent stents on re-look  cath 08/10/13 No angina  H/O noncompliance with medical treatment, presenting hazards to health He has stopped all his medications secondary to fatigue and other side effects  HTN (hypertension) Uncontrolled  HLD (hyperlipidemia) Previous LDL > 160   PLAN  [name] and I had a long conversation about CAD and medical Rx. I re checked his B/P and got 162/90. He asked about holistic therapy. I explained that our practice likes to view the pt as a whole person and not just someone with CAD. I told him we practice evidence based medicine but that I personally don't discount the benefits of certain alternative treatments such as yoga and relaxation therapy but that this alone would not be in his best interest. We agreed to start medication to get his B/P under control - Lisinopril 10 mg and HCTZ 12.5 mg- add ASA 81 mg and Crestor 5 mg. He will see me in a month. He'll call in two weeks with his B/P. He'll need a BMP in follow up.   [facility] LL[facility]ILROY,LUKE KPA-C 06/03/2014 4:52 PM

## 2014-06-08 ENCOUNTER — Other Ambulatory Visit: Payer: Self-pay

## 2014-06-08 DIAGNOSIS — E782 Mixed hyperlipidemia: Secondary | ICD-10-CM

## 2014-06-08 NOTE — Telephone Encounter (Signed)
Received prior authorization notification from patient's pharmacy for Crestor 5 mg. Patient was Crestor 40 mg for years prior to stopping all medications on his own. During office visit with Corine ShelterLuke Kilroy, PA-C, he was restarted on Crestor 5 mg. Insurance company wants prior authorization or change to preferred medication.  Patient has tried no other statin/cholesterol lowering medication. Per Dr Bishop Limbo Kelly, start Atorvastatin 40 mg - take 1/2 tablet by mouth for 2 weeks then bump up to a whole tablet and check blood work in 2 months.

## 2014-06-16 MED ORDER — ATORVASTATIN CALCIUM 40 MG PO TABS: 40.0000 mg | ORAL_TABLET | Freq: Every day | ORAL | Status: DC

## 2014-06-16 NOTE — Telephone Encounter (Signed)
Reached patient and advised him of situation. Patient stated that the Crestor made his joints and bones ache, patient has not tried other statin medications. He was willing to try the Atorvastatin. CMA ordered blood work and sent medication into pharmacy electronically.  Patient agreed and understood with plan. Has an appointment to see Corine ShelterLuke Kilroy on 07/05/14.

## 2014-07-02 ENCOUNTER — Other Ambulatory Visit: Payer: Self-pay

## 2014-07-02 DIAGNOSIS — E782 Mixed hyperlipidemia: Secondary | ICD-10-CM

## 2014-07-05 ENCOUNTER — Encounter: Payer: Self-pay | Admitting: Cardiology

## 2014-07-05 ENCOUNTER — Ambulatory Visit (INDEPENDENT_AMBULATORY_CARE_PROVIDER_SITE_OTHER): Payer: 59 | Admitting: Cardiology

## 2014-07-05 VITALS — BP 167/111 | HR 86 | Ht 70.0 in | Wt 242.4 lb

## 2014-07-05 DIAGNOSIS — R5383 Other fatigue: Secondary | ICD-10-CM

## 2014-07-05 DIAGNOSIS — I10 Essential (primary) hypertension: Secondary | ICD-10-CM

## 2014-07-05 DIAGNOSIS — R5381 Other malaise: Secondary | ICD-10-CM

## 2014-07-05 DIAGNOSIS — Z9119 Patient's noncompliance with other medical treatment and regimen: Secondary | ICD-10-CM

## 2014-07-05 DIAGNOSIS — Z79899 Other long term (current) drug therapy: Secondary | ICD-10-CM

## 2014-07-05 DIAGNOSIS — Z91199 Patient's noncompliance with other medical treatment and regimen due to unspecified reason: Secondary | ICD-10-CM

## 2014-07-05 DIAGNOSIS — E785 Hyperlipidemia, unspecified: Secondary | ICD-10-CM

## 2014-07-05 MED ORDER — NEBIVOLOL HCL 5 MG PO TABS: 5.0000 mg | ORAL_TABLET | Freq: Every day | ORAL | Status: DC

## 2014-07-05 MED ORDER — LISINOPRIL 20 MG PO TABS: 20.0000 mg | ORAL_TABLET | Freq: Every day | ORAL | Status: DC

## 2014-07-05 NOTE — Progress Notes (Signed)
07/05/2014 Mario Torres   1951-05-04  [id number]  Primary Physicia No PCP Per Patient Primary Cardiologist: Dr Tresa Endo  HPI:  Pt is a 63 y.o. male followed by Dr. Tresa Endo, who was seen in February 2012 for acute coronary syndrome. Emergent catheterization on February 06, 2011, showed 95% proximal circumflex stenosis as well as 90% RCA stenosis proximally. In addition, he had 60% stenosis of the OM-1 branch of the circumflex and 40% LAD stenosis. A  Promus stent was inserted in the circumflex. Three days later, he underwent staged intervention to his 90% to 95% proximal RCA stenosis. Myoview done on February 19, 2012 showed fairly normal perfusion without scar or ischemia. Post stress ejection fraction was 61%. Additional problems include hypertension, hyperlipidemia.  He was Hospitalized In Aug 2014 for evaluation of chest pain. He ruled out for MI. He underwent a diagnostic LHC by Dr. Tresa Endo on 08/10/13 which demonstrated no significant residual CAD with normal LAD, widely patent previously placed stents in the proximal circumflex and right coronary artery with evidence for mild luminal irregularity of 20% narrowing in the mid AV groove circumflex. He had normal LV function with an EF of 65%.  He returned to the [facility] ER on 08/25/13 with a complaint of chest pain. And was treated medically. After this he apparently stopped all his medications. "They made me feel bad". Specifically he had fatigue and impotence. He felt like it was affecting his marriage. 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 I saw him in June and added back a statin, ACE, and diuretic. He is here today for follow up. Despite using low doses he says he feels "fatigued" since starting his medications. 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 He says he has been told that "Himalayan salt" is OK to use. He denies chest pain.   Current Outpatient  Prescriptions  Medication Sig Dispense Refill  . aspirin EC 81 MG tablet Take 81 mg by mouth daily.      Marland Kitchen atorvastatin (LIPITOR) 40 MG tablet Take 1 tablet (40 mg total) by mouth daily.  30 tablet  6  . hydrochlorothiazide (MICROZIDE) 12.5 MG capsule Take 1 capsule (12.5 mg total) by mouth daily.  90 capsule  3  . lisinopril (PRINIVIL,ZESTRIL) 20 MG tablet Take 1 tablet (20 mg total) by mouth daily.  30 tablet  6  . nebivolol (BYSTOLIC) 5 MG tablet Take 1 tablet (5 mg total) by mouth daily.  30 tablet  6  . rosuvastatin (CRESTOR) 5 MG tablet Take 1 tablet (5 mg total) by mouth daily.  14 tablet  0   No current facility-administered medications for this visit.    No Known Allergies  History   Social History  . Marital Status: Married    Spouse Name: N/A    Number of Children: N/A  . Years of Education: N/A   Occupational History  . Not on file.   Social History Main Topics  . Smoking status: Former Smoker    Quit date: 08/31/1973  . Smokeless tobacco: Never Used  . Alcohol Use: No  . Drug Use: No  . Sexual Activity: Not on file   Other Topics Concern  . Not on file   Social History Narrative  . No narrative on file     Review of Systems: General: negative for chills, fever, night  sweats or weight changes. Generalized fatigue Cardiovascular: negative for chest pain, dyspnea on exertion, edema, orthopnea, palpitations, paroxysmal nocturnal dyspnea or shortness of breath Dermatological: negative for rash Respiratory: negative for cough or wheezing Urologic: negative for hematuria Abdominal: negative for nausea, vomiting, diarrhea, bright red blood per rectum, melena, or hematemesis Neurologic: negative for visual changes, syncope, or dizziness All other systems reviewed and are otherwise negative except as noted above.    Blood pressure 167/111, pulse 86, height 5\' 10"  (1.778 m), weight 242 lb 6.4 oz (109.952 kg).  General appearance: alert, cooperative, no distress  and moderately obese Neck: no carotid bruit and no JVD Lungs: clear to auscultation bilaterally Heart: regular rate and rhythm Extremities: no edema   ASSESSMENT AND PLAN:   HTN (hypertension) Still poorly controlled  HLD (hyperlipidemia) Check lipids, Cmet  CAD- s/p PCI w/ DES to circumflex, and RCA in 2012- patent stents on re-look cath 08/10/13 No angina  H/O noncompliance with medical treatment, presenting hazards to health Even after starting low doses of medications he says he feels "fatigue"   PLAN  Add Bystolic 5 mg, increase Lisinopril to 20 mg, check Cmet, TSH and testosterone level (pt requests). F/U two weeks.   Masa Lubin KPA-C 07/05/2014 8:32 AM

## 2014-07-05 NOTE — Assessment & Plan Note (Signed)
Even after starting low doses of medications he says he feels "fatigue"

## 2014-07-05 NOTE — Assessment & Plan Note (Signed)
Still poorly controlled

## 2014-07-05 NOTE — Patient Instructions (Addendum)
Your physician recommends that you schedule a follow-up appointment in: 2-3 Weeks  Your physician recommends that you return for lab work FASTING LIPIDS AND CMP,TSH  Your physician has recommended you make the following change in your medication: Increase Lisinopril 20 mg daily, start Bystolic 5 mg daily

## 2014-07-05 NOTE — Assessment & Plan Note (Signed)
Check lipids, Cmet

## 2014-07-05 NOTE — Assessment & Plan Note (Signed)
No angina 

## 2014-07-21 ENCOUNTER — Telehealth: Payer: Self-pay | Admitting: Cardiovascular Disease

## 2014-07-21 ENCOUNTER — Other Ambulatory Visit: Payer: Self-pay | Admitting: *Deleted

## 2014-07-21 MED ORDER — AMLODIPINE BESYLATE 5 MG PO TABS: 5.0000 mg | ORAL_TABLET | Freq: Every day | ORAL | Status: DC

## 2014-07-21 NOTE — Telephone Encounter (Signed)
Informed patient that his insurance will no longer cover his bystolic. Kristen changed medication to amlodipine.

## 2014-07-21 NOTE — Telephone Encounter (Signed)
Mario Torres is calling because he needs the new prescription sent back to [facility] [address] Not sure of the medication .Marland Kitchen. [facility] .Marland Kitchen. Please Call  thanks

## 2014-07-21 NOTE — Telephone Encounter (Signed)
Patient went to pick up Bystolic and was informed it was rejected and he needed to call us. [facility] who said it was rejected through insurance but did not have a P.A. Or a reason as to why. Called [facility] who stated that there was missing information and a P.A. Form will be sent over to clarify.

## 2014-07-23 ENCOUNTER — Ambulatory Visit: Payer: 59 | Admitting: Cardiology

## 2014-08-06 ENCOUNTER — Telehealth: Payer: Self-pay | Admitting: Cardiology

## 2014-08-06 MED ORDER — NITROGLYCERIN 0.4 MG SL SUBL: 0.4000 mg | SUBLINGUAL_TABLET | SUBLINGUAL | Status: DC | PRN

## 2014-08-06 NOTE — Telephone Encounter (Signed)
He needs a new prescription for his nitroglycerin. [facility] [address]His nitro was washed in his pants.

## 2014-08-06 NOTE — Telephone Encounter (Signed)
Rx was sent to pharmacy electronically. 

## 2014-08-10 ENCOUNTER — Telehealth: Payer: Self-pay | Admitting: Cardiology

## 2014-08-10 NOTE — Telephone Encounter (Signed)
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 Please fax this to Att: Tasha or [phone].Please call amd let him know when you take care of this for him.

## 2014-08-10 NOTE — Telephone Encounter (Signed)
Returned a call to patient . 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 After reviewing patient's chart I informed him that according to his last office appointment in July Luke changed his medication and requested that he return for follow up 2 weeks afterwards. Patient never gad that appointment. Informed patient he will need appointment before a note can be given. Patient verbally voiced his understanding and will call back to schedule appointment.

## 2014-08-11 ENCOUNTER — Ambulatory Visit (INDEPENDENT_AMBULATORY_CARE_PROVIDER_SITE_OTHER): Payer: 59 | Admitting: Pharmacist Clinician (PhC)/ Clinical Pharmacy Specialist

## 2014-08-11 VITALS — BP 126/80 | HR 72 | Ht 70.0 in | Wt 240.2 lb

## 2014-08-11 DIAGNOSIS — I10 Essential (primary) hypertension: Secondary | ICD-10-CM

## 2014-08-11 NOTE — Patient Instructions (Signed)
Return for a a follow up appointment in 2 months  Your blood pressure today is 126/80  Check your blood pressure at home 3-4 times per week and keep record of the readings.  No changes in medications at this time  Bring all of your meds, your BP cuff and your record of home blood pressures to your next appointment.  Exercise as you're able, try to walk approximately 30 minutes per day.  Keep salt intake to a minimum, especially watch canned and prepared boxed foods.  Eat more fresh fruits and vegetables and fewer canned items.  Avoid eating in fast food restaurants.    HOW TO TAKE YOUR BLOOD PRESSURE:   Rest 5 minutes before taking your blood pressure.    Don't smoke or drink caffeinated beverages for at least 30 minutes before.   Take your blood pressure before (not after) you eat.   Sit comfortably with your back supported and both feet on the floor (don't cross your legs).   Elevate your arm to heart level on a table or a desk.   Use the proper sized cuff. It should fit smoothly and snugly around your bare upper arm. There should be enough room to slip a fingertip under the cuff. The bottom edge of the cuff should be 1 inch above the crease of the elbow.   Ideally, take 3 measurements at one sitting and record the average.

## 2014-08-12 ENCOUNTER — Encounter: Payer: Self-pay | Admitting: Pharmacist Clinician (PhC)/ Clinical Pharmacy Specialist

## 2014-08-12 NOTE — Progress Notes (Signed)
08/12/2014 Mario Torres 10-04-1951 [id number]   HPI:  Mario Torres is a 63 y.o. male patient of Dr Tresa Endo, who presents today for a blood pressure check.  Mr. Koloski PMH includes cardiac cath in 2012 with stent placement and repeat cath in 2014.  After the second cath patient stopped all his medications due to fatigue and impotence.  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  He was seen by a PA and restarted on a statin. ACE and diuretic.  He has since stopped the diuretic (HCTZ 12.5mg ) believing it to be the cause of impotence.  He returns today for another BP check.  His family history is negative other than hypertension in his father.  His sister and other living family members have no cardiac disease.    He is trying to improve compliance at taking medications, stating that he easily gets 5 out of 7 days.  He does not drink alcohol or use tobacco, having quit at the age of 33.  His caffeine consumption consists of morning coffee and the occasional soda.  While he does eat out on occasion, their home meals include plenty of fresh or frozen foods and very low sodium.  He has started to exercise, using a stationary bike for 20 minutes a couple of times each week.     Current Outpatient Prescriptions  Medication Sig Dispense Refill  . amLODipine (NORVASC) 5 MG tablet Take 1 tablet (5 mg total) by mouth daily.  30 tablet  11  . aspirin EC 81 MG tablet Take 81 mg by mouth daily.      Marland Kitchen atorvastatin (LIPITOR) 40 MG tablet Take 1 tablet (40 mg total) by mouth daily.  30 tablet  6  . lisinopril (PRINIVIL,ZESTRIL) 20 MG tablet Take 1 tablet (20 mg total) by mouth daily.  30 tablet  6  . nebivolol (BYSTOLIC) 5 MG tablet Take 1 tablet (5 mg total) by mouth daily.  30 tablet  6  . nitroGLYCERIN (NITROSTAT) 0.4 MG SL tablet Place 1 tablet (0.4 mg total) under the tongue every 5 (five) minutes as needed for chest pain.  25 tablet  2   No  current facility-administered medications for this visit.    No Known Allergies  Past Medical History  Diagnosis Date  . Hypertension   . MI (myocardial infarction)   . Hyperlipidemia     Blood pressure 126/80, pulse 72, height  (1.778 m), weight 240 lb 3.2 oz (108.954 kg).     ASSESSMENT AND PLAN:  Today Mr. Walkup blood pressure is well within normal limits.  I repeated the test with an automatic cuff in the office and received a reading of 118/76.  We had a long discussion about medication compliance, and the importance of talking to his physician if medications are causing problems, as opposed to just stopping them.  We also discussed ways to increase his compliance, explaining that his blood pressure will more likely remain controlled with daily medications.  He does not like taking medications, but has realized that he will need them long term.  I encouraged him to increase his exercise to at least 4-5 days per week and continue with eating a healthy, low sodium diet, as this will help him to keep his BP better controlled.  I requested that he return in 2 months for a re-check.     Phillips Hay PharmD CPP  [facility]

## 2014-08-22 IMAGING — CR DG CHEST 2V
2 series · 2 of 2 positions shown · non-contrast
Comparison: February 05, 2011

CLINICAL DATA: Chest pain

CHEST - 2 VIEW

[w chest pa]
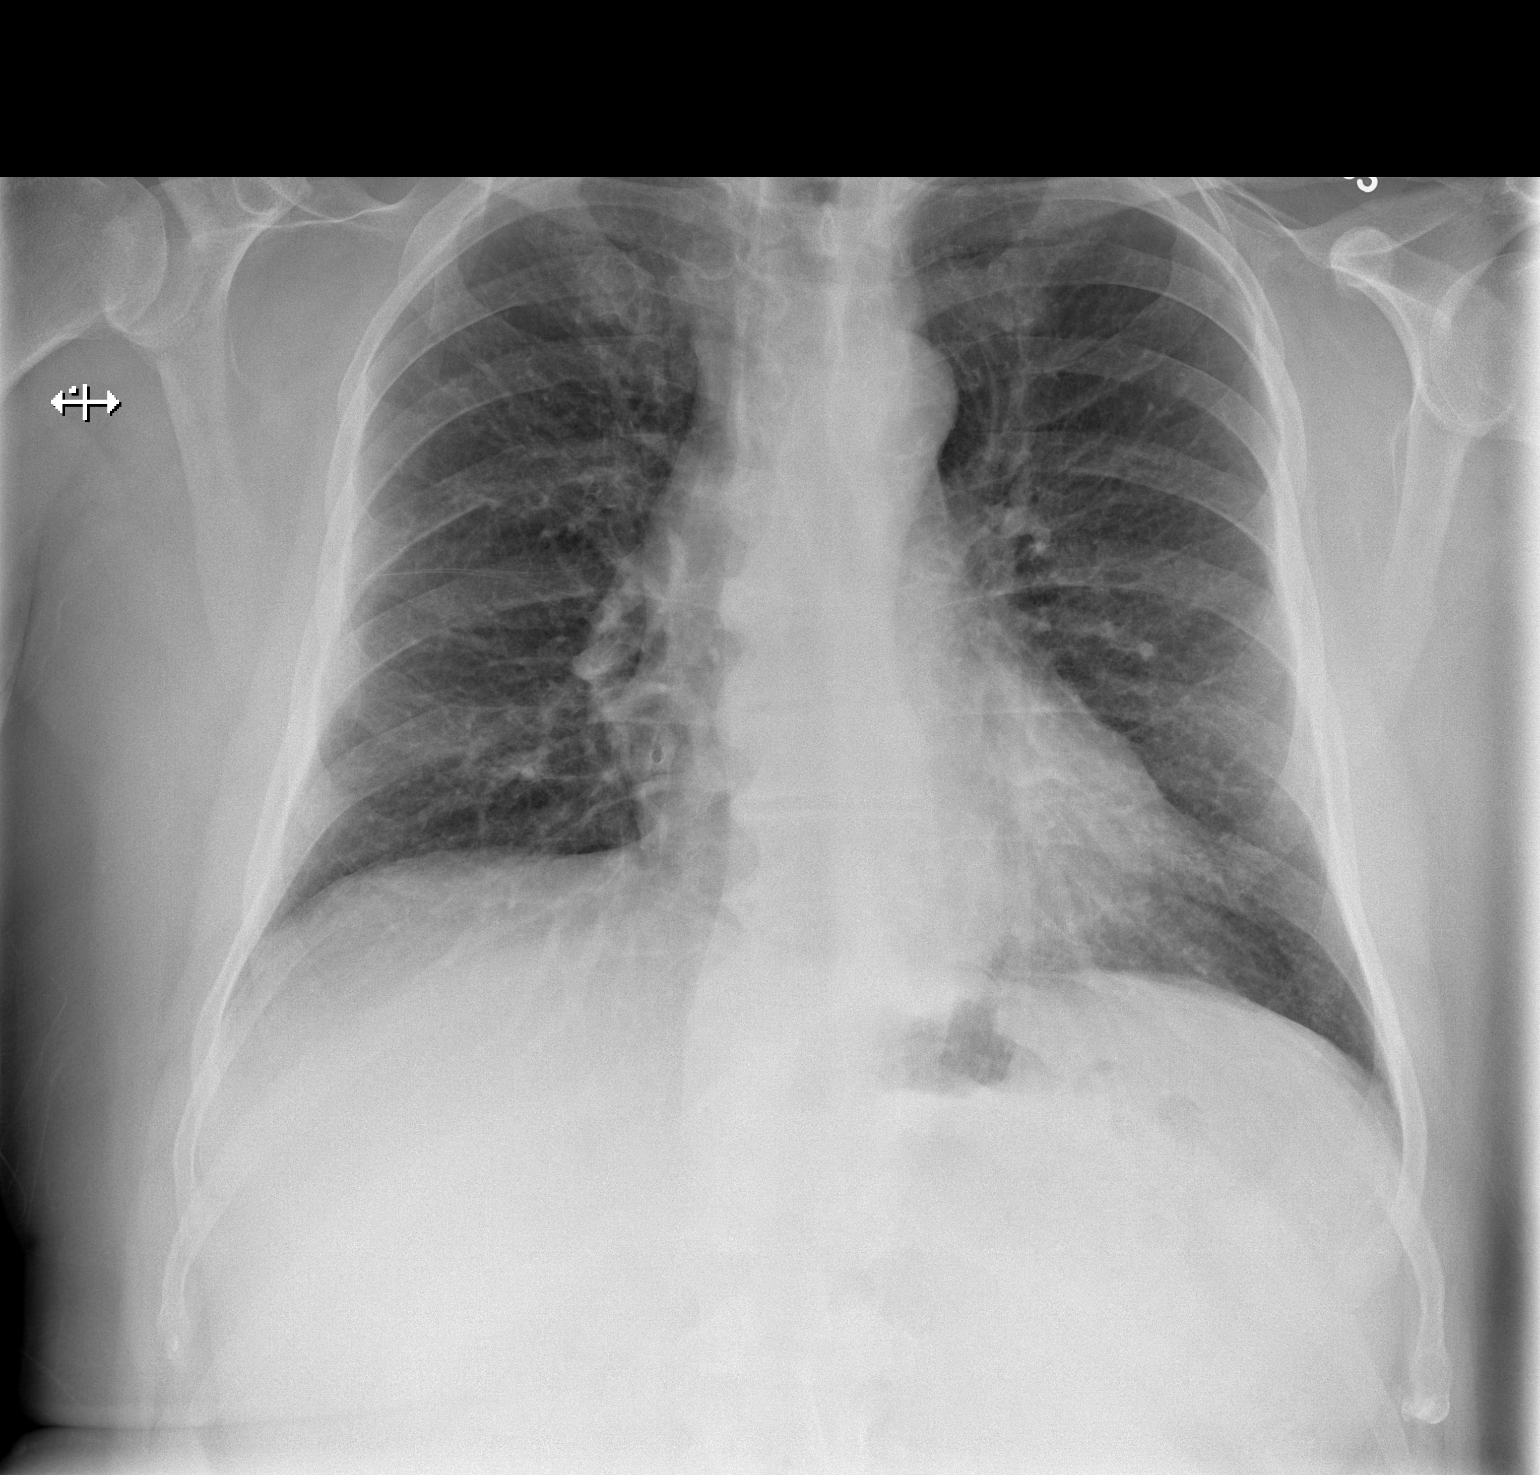

[w chest lat]
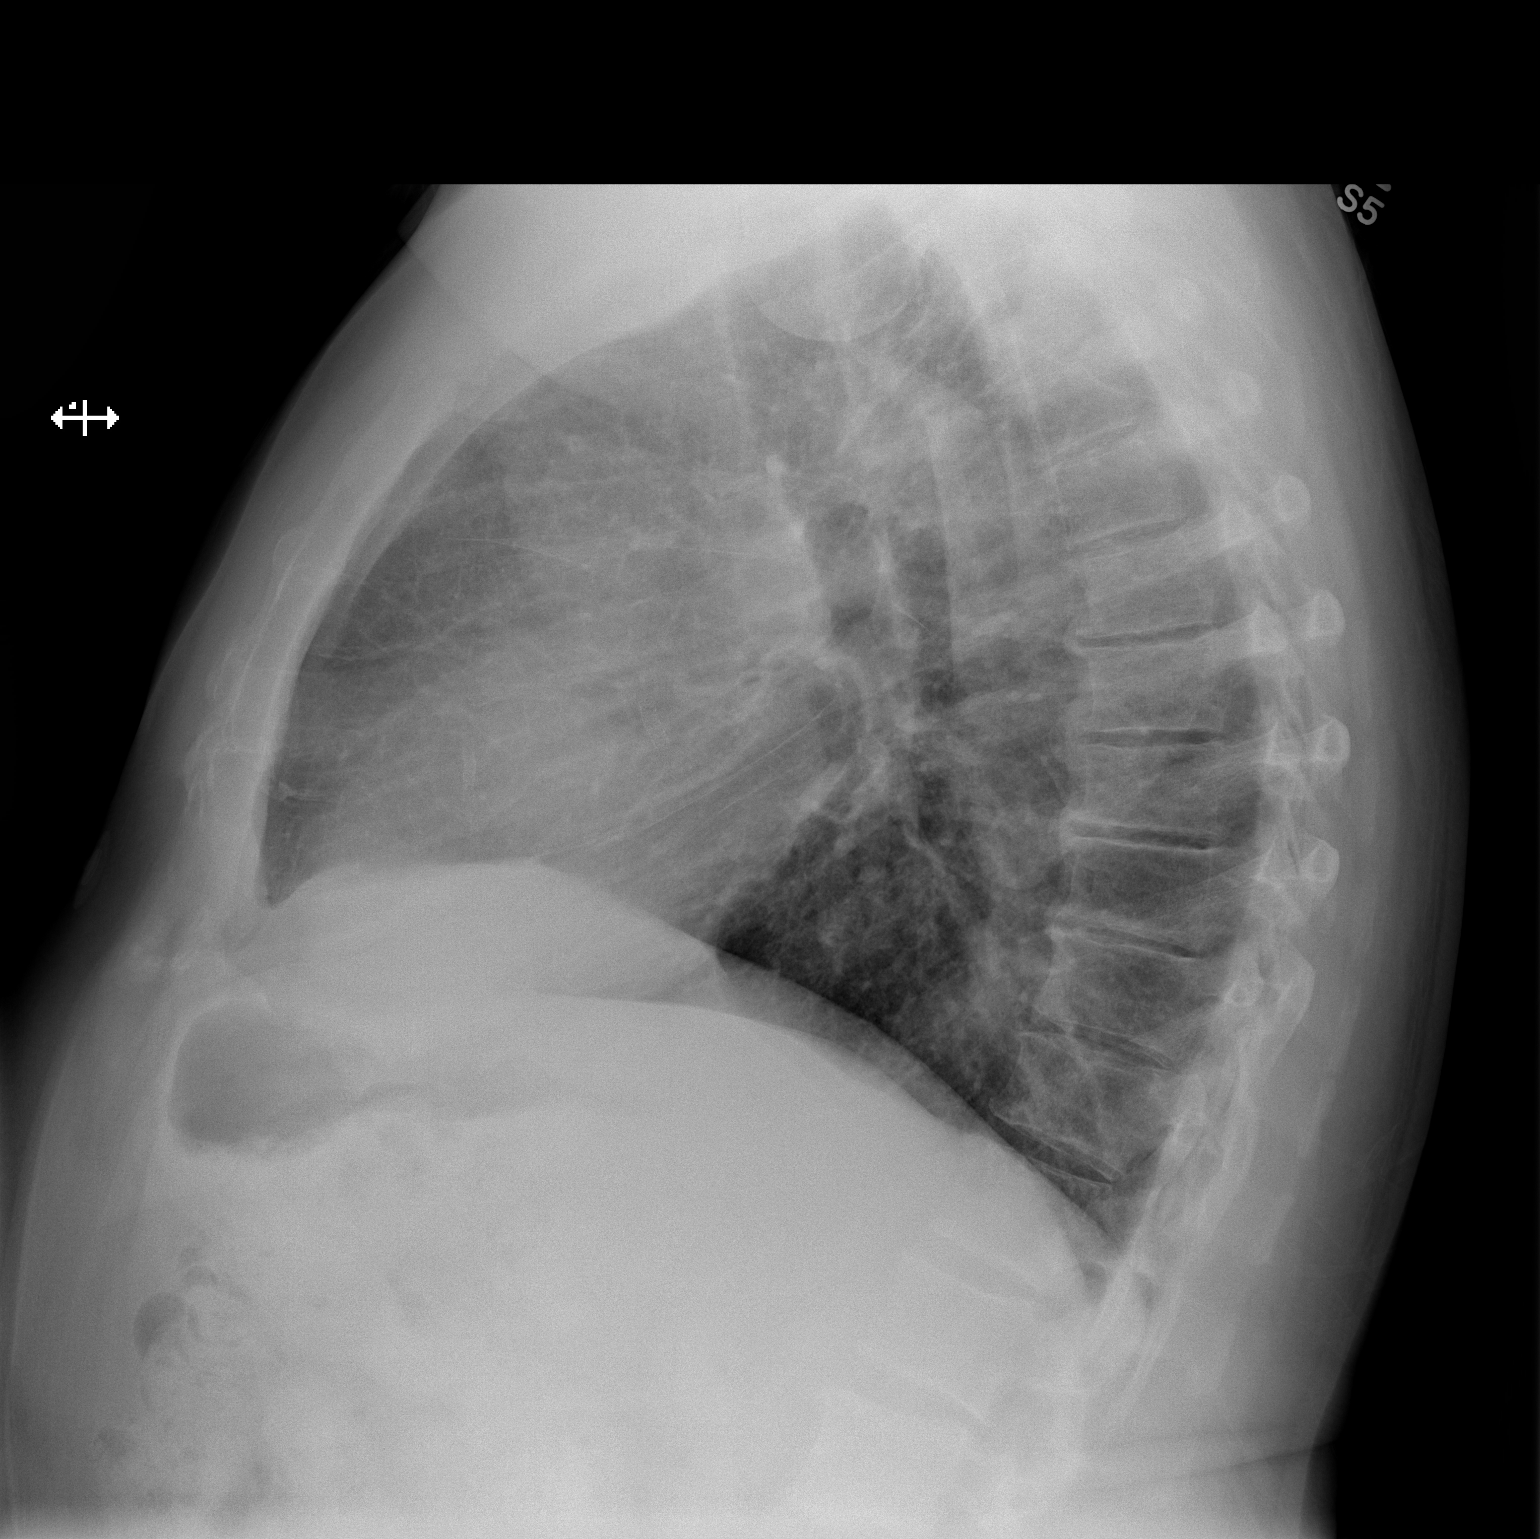

[2 of 2 positions shown; findings below may reference images not displayed]

FINDINGS: There is no edema or consolidation.  Lungs are mildly
hyperexpanded.  The heart size is within normal limits.  There is
mild pulmonary venous hypertension.  No adenopathy.  There is
degenerative change in the thoracic spine.
IMPRESSION: There is a degree of pulmonary venous hypertension.  There is no
edema or effusion.  No consolidation.  Heart size is normal.

## 2014-11-25 ENCOUNTER — Encounter (HOSPITAL_COMMUNITY): Payer: Self-pay | Admitting: Cardiovascular Disease

## 2015-04-01 ENCOUNTER — Other Ambulatory Visit: Payer: Self-pay

## 2015-04-01 MED ORDER — LISINOPRIL 20 MG PO TABS: 20.0000 mg | ORAL_TABLET | Freq: Every day | ORAL | Status: DC

## 2015-04-01 NOTE — Telephone Encounter (Signed)
Rx(s) sent to pharmacy electronically.  

## 2015-05-29 ENCOUNTER — Other Ambulatory Visit: Payer: Self-pay | Admitting: Cardiology

## 2015-05-30 ENCOUNTER — Other Ambulatory Visit: Payer: Self-pay | Admitting: *Deleted

## 2015-05-30 MED ORDER — HYDROCHLOROTHIAZIDE 12.5 MG PO CAPS: 12.5000 mg | ORAL_CAPSULE | Freq: Every day | ORAL | Status: DC

## 2015-07-09 ENCOUNTER — Other Ambulatory Visit: Payer: Self-pay | Admitting: Cardiology

## 2015-07-11 NOTE — Telephone Encounter (Signed)
Rx(s) sent to pharmacy electronically.  

## 2015-08-05 ENCOUNTER — Encounter: Payer: Self-pay | Admitting: Cardiovascular Disease

## 2015-10-03 ENCOUNTER — Other Ambulatory Visit: Payer: Self-pay | Admitting: Cardiology

## 2015-10-03 ENCOUNTER — Other Ambulatory Visit: Payer: Self-pay | Admitting: Pharmacist Clinician (PhC)/ Clinical Pharmacy Specialist

## 2015-12-06 ENCOUNTER — Other Ambulatory Visit: Payer: Self-pay | Admitting: Cardiovascular Disease

## 2015-12-06 MED ORDER — NITROGLYCERIN 0.4 MG SL SUBL: 0.4000 mg | SUBLINGUAL_TABLET | SUBLINGUAL | Status: DC | PRN

## 2015-12-06 NOTE — Telephone Encounter (Signed)
Refill for NTG done electonically with note must make appt for future refills.

## 2015-12-06 NOTE — Telephone Encounter (Signed)
°*  STAT* If patient is at the pharmacy, call can be transferred to refill team.   1. Which medications need to be refilled? (please list name of each medication and dose if known) Nitro  2. Which pharmacy/location (including street and city if local pharmacy) is [facility] to [facility]  3. Do they need a 30 day or 90 day supply? 25

## 2015-12-20 ENCOUNTER — Ambulatory Visit (INDEPENDENT_AMBULATORY_CARE_PROVIDER_SITE_OTHER): Payer: 59 | Admitting: Cardiovascular Disease

## 2015-12-20 ENCOUNTER — Encounter: Payer: Self-pay | Admitting: Cardiovascular Disease

## 2015-12-20 VITALS — BP 154/98 | HR 99 | Ht 61.0 in | Wt 240.9 lb

## 2015-12-20 DIAGNOSIS — Z9119 Patient's noncompliance with other medical treatment and regimen: Secondary | ICD-10-CM

## 2015-12-20 DIAGNOSIS — E785 Hyperlipidemia, unspecified: Secondary | ICD-10-CM

## 2015-12-20 DIAGNOSIS — R011 Cardiac murmur, unspecified: Secondary | ICD-10-CM

## 2015-12-20 DIAGNOSIS — I25119 Atherosclerotic heart disease of native coronary artery with unspecified angina pectoris: Secondary | ICD-10-CM

## 2015-12-20 DIAGNOSIS — R079 Chest pain, unspecified: Secondary | ICD-10-CM

## 2015-12-20 DIAGNOSIS — I10 Essential (primary) hypertension: Secondary | ICD-10-CM | POA: Diagnosis not present

## 2015-12-20 DIAGNOSIS — Z91199 Patient's noncompliance with other medical treatment and regimen due to unspecified reason: Secondary | ICD-10-CM

## 2015-12-20 DIAGNOSIS — Z79899 Other long term (current) drug therapy: Secondary | ICD-10-CM

## 2015-12-20 DIAGNOSIS — E669 Obesity, unspecified: Secondary | ICD-10-CM

## 2015-12-20 DIAGNOSIS — I252 Old myocardial infarction: Secondary | ICD-10-CM

## 2015-12-20 MED ORDER — CARVEDILOL 3.125 MG PO TABS: 3.1250 mg | ORAL_TABLET | Freq: Two times a day (BID) | ORAL | Status: DC

## 2015-12-20 NOTE — Patient Instructions (Signed)
Your physician has requested that you have an echocardiogram. Echocardiography is a painless test that uses sound waves to create images of your heart. It provides your doctor with information about the size and shape of your heart and how well your heart's chambers and valves are working. This procedure takes approximately one hour. There are no restrictions for this procedure.  Your physician has requested that you have a lexiscan myoview. For further information please visit [email or web address]. Please follow instruction sheet, as given.  Your physician recommends that you return for lab work fasting.  Your physician has recommended you make the following change in your medication: start new carvedilol prescription. This has been sent to your  Pharmacy.  Your physician recommends that you schedule a follow-up appointment in: 6 weeks with dr Tresa EndoKelly.

## 2015-12-21 ENCOUNTER — Telehealth (HOSPITAL_COMMUNITY): Payer: Self-pay

## 2015-12-21 ENCOUNTER — Encounter: Payer: Self-pay | Admitting: Cardiovascular Disease

## 2015-12-21 NOTE — Telephone Encounter (Signed)
Encounter complete. 

## 2015-12-21 NOTE — Progress Notes (Signed)
Patient ID: Mario Torres, male   DOB: June 24, 1951, 65 y.o.   MRN: [id number]     HPI: Mario Torres is a 65 y.o. male who presents to the office today for a  cardiology evaluation.  I have not see him in the office and last saw him when he was hospitalized for his heart catheterization in August 2014.  Mario Torres is a 65 year old gentleman who suffered an acute coronary syndrome in February 2012.  Emergent catheterization showed 95% proximal circumflex stenosis and 90% proximal RCA stenosis.  He was found to have 60% narrowing in the OM1 branch of the circumflex as well as 40% LAD stenosis.  He underwent acute intervention to his circumflex vessel with insertion of a Promus stent and 3 days later underwent staged intervention to his high-grade subtotal proximal RCA stenosis.  He subsequent Myoview study in March 2013 showed normal perfusion without scar or ischemia.  He was hospitalized in August 2014 with recurrent chest pain.  Repeat catheterization revealed widely patent previously placed stents.  Ejection fraction was 65%.  Subsequently, he was seen by Mario Torres in June 2015 and after having documented significant hypertension.  He was started back on medical therapy.  He was seen by Mario Torres July 2015.  He resume Bystolic 5 mg and recommended reinstitution of lisinopril.  The patient has not seen a physician since.  He works part-time as a Midwife.  He admits to experiencing occasional episodes of chest pain which he describes as a pressure, but often can occur after eating and typically is nonexertional.  He denies associated nausea, vomiting or pain radiation.  He denies recent tobacco, alcohol use.  He presents for evaluation.  Past Medical History  Diagnosis Date  . Hypertension   . MI (myocardial infarction) (HCC)   . Hyperlipidemia     Past Surgical History  Procedure Laterality Date  . Coronary stent placement    . Knee surgery    . Doppler echocardiography  02/07/2011    EF 50  to 55%  mild -mod posterior wall hypokinesis,pulm vein flow pattern normal    . Nm myocar perf wall motion  02/19/2012    EF 61% ,NO SIGNIFICANT WALL MOTION ABNORMALITIES NOTED  . Cardiac catheterization  02/06/2011    normal LV; CAD 40% mid left  LADafter thsecond diagonal;90-95% culprit stenosis prox.circ ;80-90% stenosis prox RCA.  Marland Kitchen Coronary angioplasty  02/06/2011    large dominant left circ sytem treated w/cutting balloon arthrotomy,stenting with 3.5 x 16 mm eerolimus Promus stent,postdilated to 4.0 mm  . Bladder suspension    . Cardiac catheterization  11/24 /2003    normal cors  . Left heart catheterization with coronary angiogram N/A 08/10/2013    Procedure: LEFT HEART CATHETERIZATION WITH CORONARY ANGIOGRAM;  Surgeon: Mario Bihari, MD;  Location: [facility];  Service: Cardiovascular;  Laterality: N/A;    No Known Allergies  Current Outpatient Prescriptions  Medication Sig Dispense Refill  . aspirin EC 81 MG tablet Take 81 mg by mouth daily.    . carvedilol (COREG) 3.125 MG tablet Take 1 tablet (3.125 mg total) by mouth 2 (two) times daily. 60 tablet 6  . nitroGLYCERIN (NITROSTAT) 0.4 MG SL tablet Place 1 tablet (0.4 mg total) under the tongue every 5 (five) minutes as needed for chest pain. Need appointment for future refills 25 tablet 0   No current facility-administered medications for this visit.    Social History   Social History  . Marital Status:  Married    Spouse Name: N/A  . Number of Children: N/A  . Years of Education: N/A   Occupational History  . Not on file.   Social History Main Topics  . Smoking status: Former Smoker    Quit date: 08/31/1973  . Smokeless tobacco: Never Used  . Alcohol Use: No  . Drug Use: No  . Sexual Activity: Not on file   Other Topics Concern  . Not on file   Social History Narrative   Socially he is married 44 years.  He has 2 children and 8 grandchildren.  He works part-time as a Surveyor, mining.  There is no tobacco  or alcohol use.  Family History  Problem Relation Age of Onset  . Hypertension Father    Family history is notable in that his father died as result of liver cancer.  Mother died secondary to suicide.  ROS General: Negative; No fevers, chills, or night sweats HEENT: Negative; No changes in vision or hearing, sinus congestion, difficulty swallowing Pulmonary: Negative; No cough, wheezing, shortness of breath, hemoptysis Cardiovascular: See HPI: No chest pain, presyncope, syncope, palpatations GI: Negative; No nausea, vomiting, diarrhea, or abdominal pain GU: Negative; No dysuria, hematuria, or difficulty voiding Musculoskeletal: Negative; no myalgias, joint pain, or weakness Hematologic: Negative; no easy bruising, bleeding Endocrine: Negative; no heat/cold intolerance; no diabetes, Neuro: Negative; no changes in balance, headaches Skin: Negative; No rashes or skin lesions Psychiatric: Negative; No behavioral problems, depression Sleep: Negative; No snoring,  daytime sleepiness, hypersomnolence, bruxism, restless legs, hypnogognic hallucinations. Other comprehensive 14 point system review is negative   Physical Exam BP 154/98 mmHg  Pulse 99  Ht 5\' 1"  (1.549 m)  Wt 240 lb 14.4 oz (109.272 kg)  BMI 45.54 kg/m2   Repeat blood pressure by me was 124/84 supine and 118/80 standing.  Wt Readings from Last 3 Encounters:  12/20/15 240 lb 14.4 oz (109.272 kg)  08/12/14 240 lb 3.2 oz (108.954 kg)  07/05/14 242 lb 6.4 oz (109.952 kg)   General: Alert, oriented, no distress.  Bearded.  Skin: normal turgor, no rashes, warm and dry HEENT: Normocephalic, atraumatic. Pupils equal round and reactive to light; sclera anicteric; extraocular muscles intact, No lid lag; Nose without nasal septal hypertrophy; Mouth/Parynx benign; Mallinpatti scale 3 Neck: No JVD, no carotid bruits; normal carotid upstroke Lungs: clear to ausculatation and percussion bilaterally; no wheezing or rales, normal  inspiratory and expiratory effort Chest wall: without tenderness to palpitation Heart: PMI not displaced, RRR, s1 s2 normal, 2/6 systolic murmur at apex, No diastolic murmur, no rubs, gallops, thrills, or heaves Abdomen: soft, nontender; no hepatosplenomehaly, BS+; abdominal aorta nontender and not dilated by palpation. Back: no CVA tenderness Pulses: 2+  Musculoskeletal: full range of motion, normal strength, no joint deformities Extremities: Pulses 2+, no clubbing cyanosis or edema, Homan's sign negative  Neurologic: grossly nonfocal; Cranial nerves grossly wnl Psychologic: Normal mood and affect   ECG (independently read by me): Normal sinus rhythm at 99 bpm.  Right bundle-branch block, left axis deviation.  QTC interval 45 ms.  LABS:  BMP Latest Ref Rng 08/25/2013 08/11/2013 08/10/2013  Glucose 70 - 99 mg/dL 161(W) 82 86  BUN 6 - 23 mg/dL 16 9 9   Creatinine 0.50 - 1.35 mg/dL 9.60 4.54 0.98  Sodium 135 - 145 mEq/L 136 140 139  Potassium 3.5 - 5.1 mEq/L 4.9 3.9 4.0  Chloride 96 - 112 mEq/L 101 107 104  CO2 19 - 32 mEq/L 25 24 27   Calcium  8.4 - 10.5 mg/dL 9.9 9.3 9.2     No flowsheet data found.  CBC Latest Ref Rng 08/25/2013 08/11/2013 08/10/2013  WBC 4.0 - 10.5 K/uL 7.2 5.7 5.8  Hemoglobin 13.0 - 17.0 g/dL 13.015.6 86.516.0 78.415.7  Hematocrit 39.0 - 52.0 % 43.0 46.2 46.1  Platelets 150 - 400 K/uL 176 128(L) 124(L)   Lab Results  Component Value Date   MCV 89.6 08/25/2013   MCV 90.6 08/11/2013   MCV 90.4 08/10/2013    Lab Results  Component Value Date   TSH 2.651 02/06/2011    BNP No results found for: BNP  ProBNP    Component Value Date/Time   PROBNP 99.1 08/25/2013 1400     Lipid Panel     Component Value Date/Time   CHOL 227* 08/08/2013 0220   TRIG 119 08/08/2013 0220   HDL 38* 08/08/2013 0220   CHOLHDL 6.0 08/08/2013 0220   VLDL 24 08/08/2013 0220   LDLCALC 165* 08/08/2013 0220     RADIOLOGY: No results found.    ASSESSMENT AND PLAN: Mario Torres  is a 65 year old white male who suffered an acute coronary syndrome on for over 21 2012 at which time he underwent successful stenting of a subtotal proximal circumflex stenosis.  He underwent staged intervention to high-grade proximal RCA stenosis.  Several days later and subsequently was found to have significant myocardial salvage with fairly normal perfusion without scar or ischemia on a Myoview study in March 2013.  At his last catheterization in August 2014.  Both stents were widely patent.  He has had difficulty with labile blood pressure.  He also has had difficulty with fatigability in the past had transiently stopped his medications.  Presently, he has not been taking his medications regularly and he admits that he essentially is stopped taking all his medications.  His blood pressure initially was elevated when taken by the nurse, but on repeat by me was within normal limits.  His resting pulse is almost tachycardic.  I am initiating therapy with carvedilol 3.125 mg twice a day.  I recommended initiation of aspirin 81 mg.  A complete set of blood work will be drawn in the fasting state.  I am scheduling him for a 2-D echo Doppler study both to evaluate his systolic murmur as well as systolic and diastolic function.  With his episodes of chest pain which are occurring weekly and described as chest pressure but it not classically exertionally precipitated.  I am scheduling him for a subsequent Lexiscan Myoview study for further evaluation of potential ischemic etiology.  I will contact him regarding his laboratory results if he is not taking statin therapy for reinstitution of treatment.  We discussed the importance of weight loss with his morbid obesity.  I will see him back in the office in 6 weeks for follow up evaluation.   Time spent: 40 minutes  Mario Biharihomas A. Kelly, MD, [facility]  12/22/2015 11:35 PM

## 2015-12-22 ENCOUNTER — Encounter: Payer: Self-pay | Admitting: Cardiovascular Disease

## 2015-12-22 DIAGNOSIS — R079 Chest pain, unspecified: Secondary | ICD-10-CM | POA: Insufficient documentation

## 2015-12-23 ENCOUNTER — Inpatient Hospital Stay (HOSPITAL_COMMUNITY): Admission: RE | Admit: 2015-12-23 | Payer: 59 | Source: Ambulatory Visit

## 2015-12-30 ENCOUNTER — Other Ambulatory Visit: Payer: Self-pay

## 2015-12-30 ENCOUNTER — Ambulatory Visit (HOSPITAL_COMMUNITY): Payer: 59 | Attending: Cardiovascular Disease

## 2015-12-30 DIAGNOSIS — E785 Hyperlipidemia, unspecified: Secondary | ICD-10-CM | POA: Insufficient documentation

## 2015-12-30 DIAGNOSIS — Z6841 Body Mass Index (BMI) 40.0 and over, adult: Secondary | ICD-10-CM | POA: Insufficient documentation

## 2015-12-30 DIAGNOSIS — R011 Cardiac murmur, unspecified: Secondary | ICD-10-CM | POA: Diagnosis not present

## 2015-12-30 DIAGNOSIS — I517 Cardiomegaly: Secondary | ICD-10-CM | POA: Insufficient documentation

## 2015-12-30 DIAGNOSIS — Z87891 Personal history of nicotine dependence: Secondary | ICD-10-CM | POA: Diagnosis not present

## 2015-12-30 DIAGNOSIS — I10 Essential (primary) hypertension: Secondary | ICD-10-CM | POA: Insufficient documentation

## 2016-01-20 ENCOUNTER — Ambulatory Visit (HOSPITAL_COMMUNITY)
Admission: RE | Admit: 2016-01-20 | Discharge: 2016-01-20 | Disposition: A | Discharge status: Home / Self Care | Payer: 59 | Source: Ambulatory Visit | Attending: Cardiovascular Disease | Admitting: Cardiovascular Disease

## 2016-01-20 DIAGNOSIS — R0609 Other forms of dyspnea: Secondary | ICD-10-CM | POA: Insufficient documentation

## 2016-01-20 DIAGNOSIS — I10 Essential (primary) hypertension: Secondary | ICD-10-CM | POA: Insufficient documentation

## 2016-01-20 DIAGNOSIS — Z87891 Personal history of nicotine dependence: Secondary | ICD-10-CM | POA: Diagnosis not present

## 2016-01-20 DIAGNOSIS — E669 Obesity, unspecified: Secondary | ICD-10-CM | POA: Diagnosis not present

## 2016-01-20 DIAGNOSIS — I25119 Atherosclerotic heart disease of native coronary artery with unspecified angina pectoris: Secondary | ICD-10-CM | POA: Diagnosis not present

## 2016-01-20 DIAGNOSIS — I4510 Unspecified right bundle-branch block: Secondary | ICD-10-CM | POA: Diagnosis not present

## 2016-01-20 DIAGNOSIS — R079 Chest pain, unspecified: Secondary | ICD-10-CM | POA: Insufficient documentation

## 2016-01-20 DIAGNOSIS — Z6834 Body mass index (BMI) 34.0-34.9, adult: Secondary | ICD-10-CM | POA: Diagnosis not present

## 2016-01-20 LAB — MYOCARDIAL PERFUSION IMAGING
CHL CUP NUCLEAR SSS: 3
CSEPPHR: 90 {beats}/min
LV sys vol: 43 mL
LVDIAVOL: 96 mL
NUC STRESS TID: 1.23
Rest HR: 60 {beats}/min
SDS: 3
SRS: 0

## 2016-01-20 MED ORDER — TECHNETIUM TC 99M SESTAMIBI GENERIC - CARDIOLITE
31.6000 | Freq: Once | INTRAVENOUS | Status: AC | PRN
  Administered 2016-01-20: 32 via INTRAVENOUS

## 2016-01-20 MED ORDER — AMINOPHYLLINE 25 MG/ML IV SOLN
75.0000 mg | Freq: Once | INTRAVENOUS | Status: AC
  Administered 2016-01-20: 75 mg via INTRAVENOUS

## 2016-01-20 MED ORDER — REGADENOSON 0.4 MG/5ML IV SOLN
0.4000 mg | Freq: Once | INTRAVENOUS | Status: AC
  Administered 2016-01-20: 0.4 mg via INTRAVENOUS

## 2016-01-20 MED ORDER — TECHNETIUM TC 99M SESTAMIBI GENERIC - CARDIOLITE
10.5000 | Freq: Once | INTRAVENOUS | Status: AC | PRN
  Administered 2016-01-20: 11 via INTRAVENOUS

## 2016-01-24 ENCOUNTER — Encounter: Payer: Self-pay | Admitting: *Deleted

## 2016-02-14 ENCOUNTER — Other Ambulatory Visit: Payer: Self-pay | Admitting: Cardiovascular Disease

## 2016-02-14 NOTE — Telephone Encounter (Signed)
Rx request sent to pharmacy.  

## 2016-02-21 ENCOUNTER — Ambulatory Visit (INDEPENDENT_AMBULATORY_CARE_PROVIDER_SITE_OTHER): Payer: 59 | Admitting: Cardiovascular Disease

## 2016-02-21 ENCOUNTER — Encounter: Payer: Self-pay | Admitting: Cardiovascular Disease

## 2016-02-21 VITALS — BP 188/110 | HR 80 | Ht 70.0 in | Wt 246.2 lb

## 2016-02-21 DIAGNOSIS — I2510 Atherosclerotic heart disease of native coronary artery without angina pectoris: Secondary | ICD-10-CM

## 2016-02-21 DIAGNOSIS — I200 Unstable angina: Secondary | ICD-10-CM | POA: Diagnosis not present

## 2016-02-21 DIAGNOSIS — E669 Obesity, unspecified: Secondary | ICD-10-CM | POA: Diagnosis not present

## 2016-02-21 DIAGNOSIS — I119 Hypertensive heart disease without heart failure: Secondary | ICD-10-CM

## 2016-02-21 DIAGNOSIS — E785 Hyperlipidemia, unspecified: Secondary | ICD-10-CM

## 2016-02-21 DIAGNOSIS — Z79899 Other long term (current) drug therapy: Secondary | ICD-10-CM

## 2016-02-21 DIAGNOSIS — I2583 Coronary atherosclerosis due to lipid rich plaque: Secondary | ICD-10-CM

## 2016-02-21 MED ORDER — ROSUVASTATIN CALCIUM 20 MG PO TABS: 20.0000 mg | ORAL_TABLET | Freq: Every day | ORAL | Status: DC

## 2016-02-21 MED ORDER — CARVEDILOL 6.25 MG PO TABS: 6.2500 mg | ORAL_TABLET | Freq: Two times a day (BID) | ORAL | Status: DC

## 2016-02-21 MED ORDER — LISINOPRIL 5 MG PO TABS: 5.0000 mg | ORAL_TABLET | Freq: Every day | ORAL | Status: DC

## 2016-02-21 NOTE — Patient Instructions (Addendum)
Your physician has recommended you make the following change in your medication:   1.) the carvedilol has been changed to 6.25 mg twice a day.  2.) start new prescriptions for lisinopril and generic crestor. strt the crestor out at 1/2 tablet daily.  Your physician recommends that you schedule a follow-up appointment in: 2-3 months.  Your physician recommends that you return for lab work in: tomorrow and again in 2-3 months.

## 2016-02-23 ENCOUNTER — Encounter: Payer: Self-pay | Admitting: Cardiovascular Disease

## 2016-02-23 DIAGNOSIS — I119 Hypertensive heart disease without heart failure: Secondary | ICD-10-CM | POA: Insufficient documentation

## 2016-02-23 NOTE — Progress Notes (Signed)
Patient ID: JAKAIDEN FILL, male   DOB: 1951-02-28, 65 y.o.   MRN: [id number]     HPI: Mario Torres is a 65 y.o. male who presents to the office today for a 2 month cardiology evaluation.    Mario Torres is a 65 year old gentleman who suffered an acute coronary syndrome in February 2012.  Emergent catheterization showed 95% proximal circumflex stenosis and 90% proximal RCA stenosis.  He was found to have 60% narrowing in the OM1 branch of the circumflex as well as 40% LAD stenosis.  He underwent acute intervention to his circumflex vessel with insertion of a Promus stent and 3 days later underwent staged intervention to his high-grade subtotal proximal RCA stenosis.  He subsequent Myoview study in March 2013 showed normal perfusion without scar or ischemia.  He was hospitalized in August 2014 with recurrent chest pain.  Repeat catheterization revealed widely patent previously placed stents.  Ejection fraction was 65%.  Subsequently, he was seen by Mario Torres in June 2015 and after having documented significant hypertension.  He was started back on medical therapy.  He was seen by Mario Torres July 2015.  He resume Bystolic 5 mg and recommended reinstitution of lisinopril.  The patient has not seen a physician since.  He works part-time as a Midwife.  He admits to experiencing occasional episodes of chest pain which he describes as a pressure, but often can occur after eating and typically is nonexertional.  He denies associated nausea, vomiting or pain radiation.  He denies recent tobacco, alcohol use.    I had not seen him in over 2 years but saw him 2 months ago.  At that time, he has essentially had stopped taking all his medications.  His resting pulse was almost tachycardic and I instituted carvedilol initially at 3.125 mg twice a day.  I also recommended initiation of aspirin.  I scheduled him for a neck oh as well as a Lexiscan study.  A Doppler study done on 12/30/2015 showed an EF of 55-60%.  There  was evidence for severe LVH and grade 2 diastolic dysfunction.  Left atrium was severely dilated.  There was mild pulmonary hypertension with a PA pressure 31 mm.  A Lexiscan study revealed normal perfusion without evidence for scar or ischemia.  Ejection fraction was 55%.  Presently, he denies any chest pain.  He tells me next week.  He will have 17 teeth pulled.  He presents for evaluation Past Medical History  Diagnosis Date  . Hypertension   . MI (myocardial infarction) (HCC)   . Hyperlipidemia     Past Surgical History  Procedure Laterality Date  . Coronary stent placement    . Knee surgery    . Doppler echocardiography  02/07/2011    EF 50 to 55%  mild -mod posterior wall hypokinesis,pulm vein flow pattern normal    . Nm myocar perf wall motion  02/19/2012    EF 61% ,NO SIGNIFICANT WALL MOTION ABNORMALITIES NOTED  . Cardiac catheterization  02/06/2011    normal LV; CAD 40% mid left  LADafter thsecond diagonal;90-95% culprit stenosis prox.circ ;80-90% stenosis prox RCA.  Marland Kitchen Coronary angioplasty  02/06/2011    large dominant left circ sytem treated w/cutting balloon arthrotomy,stenting with 3.5 x 16 mm eerolimus Promus stent,postdilated to 4.0 mm  . Bladder suspension    . Cardiac catheterization  11/24 /2003    normal cors  . Left heart catheterization with coronary angiogram N/A 08/10/2013    Procedure: LEFT HEART CATHETERIZATION  WITH CORONARY ANGIOGRAM;  Surgeon: Lennette Bihari, MD;  Location: [facility] CATH LAB;  Service: Cardiovascular;  Laterality: N/A;    No Known Allergies  Current Outpatient Prescriptions  Medication Sig Dispense Refill  . aspirin EC 81 MG tablet Take 81 mg by mouth daily.    . nitroGLYCERIN (NITROSTAT) 0.4 MG SL tablet PLACE 1 TABLET UNDER TONGUE EVERY 5 MINUTES AS NEEDED FOR CHEST PAIN 25 tablet 3  . carvedilol (COREG) 6.25 MG tablet Take 1 tablet (6.25 mg total) by mouth 2 (two) times daily. 60 tablet 6  . lisinopril (PRINIVIL,ZESTRIL) 5 MG tablet Take 1  tablet (5 mg total) by mouth daily. 30 tablet 6  . rosuvastatin (CRESTOR) 20 MG tablet Take 1 tablet (20 mg total) by mouth daily. 30 tablet 6   No current facility-administered medications for this visit.    Social History   Social History  . Marital Status: Married    Spouse Name: N/A  . Number of Children: N/A  . Years of Education: N/A   Occupational History  . Not on file.   Social History Main Topics  . Smoking status: Former Smoker    Quit date: 08/31/1973  . Smokeless tobacco: Never Used  . Alcohol Use: No  . Drug Use: No  . Sexual Activity: Not on file   Other Topics Concern  . Not on file   Social History Narrative   Socially he is married 44 years.  He has 2 children and 8 grandchildren.  He works part-time as a Surveyor, mining.  There is no tobacco or alcohol use.  Family History  Problem Relation Age of Onset  . Hypertension Father    Family history is notable in that his father died as result of liver cancer.  Mother died secondary to suicide.  ROS General: Negative; No fevers, chills, or night sweats HEENT: Positive for poor dentition; No changes in vision or hearing, sinus congestion, difficulty swallowing Pulmonary: Negative; No cough, wheezing, shortness of breath, hemoptysis Cardiovascular: See HPI: No chest pain, presyncope, syncope, palpatations GI: Negative; No nausea, vomiting, diarrhea, or abdominal pain GU: Negative; No dysuria, hematuria, or difficulty voiding Musculoskeletal: Negative; no myalgias, joint pain, or weakness Hematologic: Negative; no easy bruising, bleeding Endocrine: Negative; no heat/cold intolerance; no diabetes, Neuro: Negative; no changes in balance, headaches Skin: Negative; No rashes or skin lesions Psychiatric: Negative; No behavioral problems, depression Sleep: Negative; No snoring,  daytime sleepiness, hypersomnolence, bruxism, restless legs, hypnogognic hallucinations. Other comprehensive 14 point system review  is negative   Physical Exam BP 188/110 mmHg  Pulse 80  Ht 5\' 10"  (1.778 m)  Wt 246 lb 3.2 oz (111.676 kg)  BMI 35.33 kg/m2   Repeat blood pressure by me was 158/94   Wt Readings from Last 3 Encounters:  02/21/16 246 lb 3.2 oz (111.676 kg)  01/20/16 240 lb (108.863 kg)  12/20/15 240 lb 14.4 oz (109.272 kg)   General: Alert, oriented, no distress.  Bearded.  Skin: normal turgor, no rashes, warm and dry HEENT: Normocephalic, atraumatic. Pupils equal round and reactive to light; sclera anicteric; extraocular muscles intact, No lid lag; Nose without nasal septal hypertrophy; Mouth/Parynx benign; Mallinpatti scale 3 Neck: No JVD, no carotid bruits; normal carotid upstroke Lungs: clear to ausculatation and percussion bilaterally; no wheezing or rales, normal inspiratory and expiratory effort Chest wall: without tenderness to palpitation Heart: PMI not displaced, RRR, s1 s2 normal, 2/6 systolic murmur at apex, No diastolic murmur, no rubs, gallops, thrills, or heaves Abdomen:  soft, nontender; no hepatosplenomehaly, BS+; abdominal aorta nontender and not dilated by palpation. Back: no CVA tenderness Pulses: 2+  Musculoskeletal: full range of motion, normal strength, no joint deformities Extremities: Pulses 2+, no clubbing cyanosis or edema, Homan's sign negative  Neurologic: grossly nonfocal; Cranial nerves grossly wnl Psychologic: Normal mood and affect  ECG (independently read by me): Normal sinus rhythm at 80 bpm.  Right bundle-branch block with repolarization changes.  QTc interval 452 ms.  Prior ECG (independently read by me): Normal sinus rhythm at 99 bpm.  Right bundle-branch block, left axis deviation.   LABS:  BMP Latest Ref Rng 08/25/2013 08/11/2013 08/10/2013  Glucose 70 - 99 mg/dL 161(W115(H) 82 86  BUN 6 - 23 mg/dL 16 9 9   Creatinine 0.50 - 1.35 mg/dL 9.601.03 4.540.95 0.981.02  Sodium 135 - 145 mEq/L 136 140 139  Potassium 3.5 - 5.1 mEq/L 4.9 3.9 4.0  Chloride 96 - 112 mEq/L 101 107 104    CO2 19 - 32 mEq/L 25 24 27   Calcium 8.4 - 10.5 mg/dL 9.9 9.3 9.2     No flowsheet data found.  CBC Latest Ref Rng 08/25/2013 08/11/2013 08/10/2013  WBC 4.0 - 10.5 K/uL 7.2 5.7 5.8  Hemoglobin 13.0 - 17.0 g/dL 11.915.6 14.716.0 82.915.7  Hematocrit 39.0 - 52.0 % 43.0 46.2 46.1  Platelets 150 - 400 K/uL 176 128(L) 124(L)   Lab Results  Component Value Date   MCV 89.6 08/25/2013   MCV 90.6 08/11/2013   MCV 90.4 08/10/2013    Lab Results  Component Value Date   TSH 2.651 02/06/2011    BNP No results found for: BNP  ProBNP    Component Value Date/Time   PROBNP 99.1 08/25/2013 1400     Lipid Panel     Component Value Date/Time   CHOL 227* 08/08/2013 0220   TRIG 119 08/08/2013 0220   HDL 38* 08/08/2013 0220   CHOLHDL 6.0 08/08/2013 0220   VLDL 24 08/08/2013 0220   LDLCALC 165* 08/08/2013 0220     RADIOLOGY: No results found.    ASSESSMENT AND PLAN: Mario Torres is a 65 year old white male who suffered an acute coronary syndrome in February 2012 at which time he underwent successful stenting of a subtotal proximal circumflex stenosis.  He underwent staged intervention to high-grade proximal RCA stenosis.  Several days later and subsequently was found to have significant myocardial salvage with fairly normal perfusion without scar or ischemia on a Myoview study in March 2013.  At his last catheterization in August 2014 both stents were widely patent.  I reviewed his echo Doppler findings with him in detail.  This demonstrates severe LVH with grade 2 diastolic dysfunction without significant valvular pathology.  There was mild pulmonary hypertension.  His nuclear stress study continues to show normal perfusion and argues against restenosis.  Blood pressure today continues to be elevated and I further titrated his carvedilol to 6.25 mg twice a day.  I am starting him on lisinopril 5 mg daily for ACE inhibition and improved blood pressure control.  With his CAD, I am also starting  him on Crestor and he will initiate this at 10 mg with plans to titrate to 20 mg with a target LDL less than 70.  I recommended that he was aspirin therapy for 5 days prior to his extensive 17 teeth dental extraction next week. With his obesity I have recommended weight loss and increased exercise.   I will see him in 2 months for cardiology evaluation.  Time spent: 25 minutes  Lennette Bihari, MD, [facility]  02/23/2016 8:46 PM

## 2016-02-25 LAB — TSH: TSH: 1.69 mIU/L (ref 0.40–4.50)

## 2016-02-25 LAB — COMPREHENSIVE METABOLIC PANEL
ALT: 28 U/L (ref 9–46)
AST: 25 U/L (ref 10–35)
Albumin: 4.3 g/dL (ref 3.6–5.1)
Alkaline Phosphatase: 74 U/L (ref 40–115)
BUN: 13 mg/dL (ref 7–25)
CHLORIDE: 103 mmol/L (ref 98–110)
CO2: 25 mmol/L (ref 20–31)
Calcium: 9.5 mg/dL (ref 8.6–10.3)
Creat: 0.94 mg/dL (ref 0.70–1.25)
Glucose, Bld: 105 mg/dL — ABNORMAL HIGH (ref 65–99)
POTASSIUM: 4.9 mmol/L (ref 3.5–5.3)
SODIUM: 137 mmol/L (ref 135–146)
Total Bilirubin: 0.7 mg/dL (ref 0.2–1.2)
Total Protein: 6.9 g/dL (ref 6.1–8.1)

## 2016-02-25 LAB — LIPID PANEL
Cholesterol: 243 mg/dL — ABNORMAL HIGH (ref 125–200)
HDL: 37 mg/dL — ABNORMAL LOW (ref >=40)
LDL Cholesterol: 163 mg/dL — ABNORMAL HIGH (ref <130)
TRIGLYCERIDES: 217 mg/dL — AB (ref <150)
Total CHOL/HDL Ratio: 6.6 Ratio — ABNORMAL HIGH (ref <=5.0)
VLDL: 43 mg/dL — AB (ref <30)

## 2016-02-25 LAB — CBC
HCT: 47.5 % (ref 39.0–52.0)
Hemoglobin: 16.8 g/dL (ref 13.0–17.0)
MCH: 32.1 pg (ref 26.0–34.0)
MCHC: 35.4 g/dL (ref 30.0–36.0)
MCV: 90.8 fL (ref 78.0–100.0)
MPV: 11.3 fL (ref 8.6–12.4)
Platelets: 154 10*3/uL (ref 150–400)
RBC: 5.23 MIL/uL (ref 4.22–5.81)
RDW: 13.8 % (ref 11.5–15.5)
WBC: 7.1 10*3/uL (ref 4.0–10.5)

## 2016-03-19 ENCOUNTER — Telehealth: Payer: Self-pay | Admitting: *Deleted

## 2016-03-19 ENCOUNTER — Other Ambulatory Visit: Payer: Self-pay | Admitting: *Deleted

## 2016-03-19 DIAGNOSIS — E785 Hyperlipidemia, unspecified: Secondary | ICD-10-CM

## 2016-03-19 DIAGNOSIS — Z79899 Other long term (current) drug therapy: Secondary | ICD-10-CM

## 2016-03-19 MED ORDER — ROSUVASTATIN CALCIUM 40 MG PO TABS: 40.0000 mg | ORAL_TABLET | Freq: Every day | ORAL | Status: DC

## 2016-03-19 NOTE — Telephone Encounter (Signed)
Left lab results and recommendations on patient;s voice mail. Repeat labs and new crestor dose were ordered.

## 2016-03-19 NOTE — Progress Notes (Signed)
Called and left results and recommendations on VM.

## 2016-03-19 NOTE — Telephone Encounter (Signed)
-----   Message from Lennette Biharihomas A Kelly, MD sent at 03/17/2016  3:35 PM EDT ----- Titrate crestor to 40 mg daily. Re-check LFTs and lipids in 2-3 months

## 2016-04-02 ENCOUNTER — Other Ambulatory Visit: Payer: Self-pay | Admitting: *Deleted

## 2016-04-02 ENCOUNTER — Other Ambulatory Visit: Payer: Self-pay

## 2016-04-02 MED ORDER — CARVEDILOL 6.25 MG PO TABS: 6.2500 mg | ORAL_TABLET | Freq: Two times a day (BID) | ORAL | Status: DC

## 2016-04-17 ENCOUNTER — Encounter: Payer: Self-pay | Admitting: *Deleted

## 2016-04-17 ENCOUNTER — Other Ambulatory Visit: Payer: Self-pay | Admitting: *Deleted

## 2016-04-17 DIAGNOSIS — E785 Hyperlipidemia, unspecified: Secondary | ICD-10-CM

## 2016-04-17 DIAGNOSIS — Z79899 Other long term (current) drug therapy: Secondary | ICD-10-CM

## 2016-05-15 ENCOUNTER — Encounter: Payer: 59 | Admitting: Cardiovascular Disease

## 2016-05-22 NOTE — Progress Notes (Signed)
This encounter was created in error - please disregard.

## 2016-09-11 ENCOUNTER — Ambulatory Visit: Payer: 59 | Admitting: Cardiovascular Disease

## 2017-04-24 ENCOUNTER — Telehealth: Payer: Self-pay | Admitting: Cardiovascular Disease

## 2017-04-24 MED ORDER — LISINOPRIL 5 MG PO TABS: 5.0000 mg | ORAL_TABLET | Freq: Every day | ORAL | 6 refills | Status: DC

## 2017-04-24 MED ORDER — CARVEDILOL 6.25 MG PO TABS: 6.2500 mg | ORAL_TABLET | Freq: Two times a day (BID) | ORAL | 0 refills | Status: DC

## 2017-04-24 MED ORDER — NITROGLYCERIN 0.4 MG SL SUBL: SUBLINGUAL_TABLET | SUBLINGUAL | 3 refills | Status: DC

## 2017-04-24 NOTE — Telephone Encounter (Signed)
New message      *STAT* If patient is at the pharmacy, call can be transferred to refill team.   1. Which medications need to be refilled? (please list name of each medication and dose if known)  carvedilol (COREG) 6.25 MG tablet Take 1 tablet (6.25 mg total) by mouth 2 (two) times daily.    lisinopril (PRINIVIL,ZESTRIL) 5 MG tablet Take 1 tablet (5 mg total) by mouth daily.   Also refill the Nitor  2. Which pharmacy/location (including street and city if local pharmacy) is medication to be sent to?[facility] on [address]   3. Do they need a 30 day or 90 day supply? 90

## 2017-04-30 ENCOUNTER — Ambulatory Visit: Payer: 59 | Admitting: Cardiovascular Disease

## 2017-06-14 ENCOUNTER — Telehealth: Payer: Self-pay | Admitting: Cardiovascular Disease

## 2017-06-14 MED ORDER — LISINOPRIL 10 MG PO TABS: 10.0000 mg | ORAL_TABLET | Freq: Every day | ORAL | 6 refills | Status: DC

## 2017-06-14 NOTE — Telephone Encounter (Signed)
Spoke with patient and he had already spoken to Raquel. Did schedule follow up with Hal M PA since he will be out of town week of his appointment with Dr Tresa EndoKelly

## 2017-06-14 NOTE — Telephone Encounter (Signed)
Patient of Dr Tresa EndoKelly previously seen by HTN clinic.  Currently HTN medication: Lisinopril 5mg  daily  Carvedilol 6.25mg  twice daily   **Recommendation: 1. Increase lisinopril to 10mg  daily 2. Continue carvedilol 6.25mg  twice daily 3. Repeat BMET in 3-4 weeks 4. Keep f/u visit with Dr Tresa EndoKelly  Patient request change date for f/u visit. Okay to see PA. Needs appointment before end of August but unable to make it on 07/23/2017. Please call patient back with new appointment.

## 2017-06-14 NOTE — Telephone Encounter (Signed)
Spoke with patient and he stated that his blood pressure has been averaging around 150/90 heart rate in upper 70's for about a month now. Would like to see if medications can be adjusted  Will forward to Pharm D for review

## 2017-06-14 NOTE — Telephone Encounter (Signed)
New Message     Pt is inquiring about a prescription for blood pressure medication , he has been dieting and exercising but bp is still up.     Pt c/o BP issue: STAT if pt c/o blurred vision, one-sided weakness or slurred speech  1. What are your last 5 BP readings? 150/90   2. Are you having any other symptoms (ex. Dizziness, headache, blurred vision, passed out)?  No   3. What is your BP issue?  Inquiring about medication to help with bp

## 2017-07-05 ENCOUNTER — Ambulatory Visit (INDEPENDENT_AMBULATORY_CARE_PROVIDER_SITE_OTHER): Payer: BLUE CROSS/BLUE SHIELD | Admitting: Physician Assistant

## 2017-07-05 ENCOUNTER — Other Ambulatory Visit: Payer: Self-pay | Admitting: *Deleted

## 2017-07-05 ENCOUNTER — Encounter: Payer: Self-pay | Admitting: Physician Assistant

## 2017-07-05 ENCOUNTER — Other Ambulatory Visit: Payer: Self-pay | Admitting: Physician Assistant

## 2017-07-05 VITALS — BP 182/112 | HR 69 | Ht 69.5 in | Wt 226.0 lb

## 2017-07-05 DIAGNOSIS — I10 Essential (primary) hypertension: Secondary | ICD-10-CM

## 2017-07-05 DIAGNOSIS — I2510 Atherosclerotic heart disease of native coronary artery without angina pectoris: Secondary | ICD-10-CM

## 2017-07-05 DIAGNOSIS — E785 Hyperlipidemia, unspecified: Secondary | ICD-10-CM

## 2017-07-05 LAB — BASIC METABOLIC PANEL
BUN/Creatinine Ratio: 14 (ref 10–24)
BUN: 14 mg/dL (ref 8–27)
CO2: 22 mmol/L (ref 20–29)
Calcium: 9.8 mg/dL (ref 8.6–10.2)
Chloride: 100 mmol/L (ref 96–106)
Creatinine, Ser: 1.01 mg/dL (ref 0.76–1.27)
GFR, EST AFRICAN AMERICAN: 90 mL/min/{1.73_m2} (ref >59)
GFR, EST NON AFRICAN AMERICAN: 78 mL/min/{1.73_m2} (ref >59)
Glucose: 68 mg/dL (ref 65–99)
POTASSIUM: 4.7 mmol/L (ref 3.5–5.2)
Sodium: 139 mmol/L (ref 134–144)

## 2017-07-05 MED ORDER — CARVEDILOL 12.5 MG PO TABS: 12.5000 mg | ORAL_TABLET | Freq: Two times a day (BID) | ORAL | 3 refills | Status: DC

## 2017-07-05 MED ORDER — LISINOPRIL 20 MG PO TABS: 20.0000 mg | ORAL_TABLET | Freq: Every day | ORAL | 3 refills | Status: DC

## 2017-07-05 MED ORDER — ATORVASTATIN CALCIUM 20 MG PO TABS: 20.0000 mg | ORAL_TABLET | Freq: Every day | ORAL | 3 refills | Status: DC

## 2017-07-05 NOTE — Patient Instructions (Signed)
Medication Instructions:   START Lipitor 20mg  DAILY  INCREASE Coreg (carvedilol) to 12.5mg  TWICE DAILY.  INCREASE Lisinopril to 20mg  DAILY  (Prescriptions have been sent to your pharmacy for the new doses.)  Labwork:   Non-fasting BMET today.  Fasting labwork (cholesterol & liver tests) in 6-8 weeks.  Testing/Procedures:  none  Follow-Up:  With Azalee CourseHao Meng PA in 2-3 weeks.  You have been referred for a nutrition consult. Someone will call you at home to schedule this appointment.  If you need a refill on your cardiac medications before your next appointment, please call your pharmacy.

## 2017-07-05 NOTE — Progress Notes (Signed)
Cardiology Office Note    Date:  07/06/2017   ID:  Mario Torres, DOB 1951-07-06, MRN [id number]  PCP:  Patient, No Pcp Per  Cardiologist:  Dr. Tresa Endo  Chief Complaint  Patient presents with  . Follow-up    seen for Dr. Tresa Endo    History of Present Illness:  Mario Torres is a 66 y.o. male with PMH of HLD, HTN and CAD. He suffered an episode of ACS in February 2012, emergent cardiac catheterization at that time showed 95% proximal left circumflex and 90% proximal RCA lesion. He was also found to have 60% narrowing in OM1 branch of left circumflex as well as 40% LAD stenosis. He underwent acute intervention to left circumflex with a Promus stent and 3 days later underwent staged PCI to his RCA. He had Myoview in March 2013 showed normal perfusion without scar or ischemia. Repeat cardiac catheterization in August 2014 showed widely patent stents, EF 65%. He was lost to follow-up from July 2015 until January 2017 during which period he also stopped all his medication. He returned in January 2017 for follow-up. Echocardiogram obtained on 12/30/2015 showed EF 55-60%, grade 2 diastolic dysfunction, severe hypertrophy of septal wall, moderate hypertrophy of posterior wall, severely dilated left atrium, PA peak pressure 31 mmHg. Myoview obtained in February 2017 showed EF 55%, normal perfusion, no ischemia or infarction. During his last office visit in March 2017, his Coreg was increased to 6.25 mg twice a day, he was also started on lisinopril and Crestor.  He presents today for cardiology office evaluation. 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 He is presenting blood pressure today is 182/112. Although he says his blood pressure usually in running in the 150s at home, even back in March 2017, his blood pressure was also in the 180s range. We went over the previous echocardiogram that showed wall thickening indicative of  chronically uncontrolled high blood pressure, I emphasized the need to control his blood pressure better. He also discontinue the Crestor due to myalgia. However he has never tried any other statin, and willing to give Lipitor 20 mg a try. His lisinopril was also increased recently increased to 10 mg since 06/14/2017. I will further increase carvedilol to 12.5 mg twice a day and lisinopril to 20 mg daily. I will obtain a basic metabolic panel today. After addition of 20 mg Lipitor, he will need fasting lipid panel and LFTs in 6-8 weeks. I will bring him back in 2-3 weeks for further blood pressure medication titration. He also wished to be referred to a nutritionist.    Past Medical History:  Diagnosis Date  . Hyperlipidemia   . Hypertension   . MI (myocardial infarction) [facility]     Past Surgical History:  Procedure Laterality Date  . BLADDER SUSPENSION    . CARDIAC CATHETERIZATION  02/06/2011   normal LV; CAD 40% mid left  LADafter thsecond diagonal;90-95% culprit stenosis prox.circ ;80-90% stenosis prox RCA.  Marland Kitchen CARDIAC CATHETERIZATION  11/24 /2003   normal cors  . CORONARY ANGIOPLASTY  02/06/2011   large dominant left circ sytem treated w/cutting balloon arthrotomy,stenting with 3.5 x 16 mm eerolimus Promus stent,postdilated to 4.0 mm  . CORONARY STENT PLACEMENT    . DOPPLER ECHOCARDIOGRAPHY  02/07/2011   EF 50 to 55%  mild -mod posterior wall hypokinesis,pulm vein flow pattern normal    . KNEE SURGERY    .  LEFT HEART CATHETERIZATION WITH CORONARY ANGIOGRAM N/A 08/10/2013   Procedure: LEFT HEART CATHETERIZATION WITH CORONARY ANGIOGRAM;  Surgeon: Lennette Biharihomas A Kelly, MD;  [facility];  Service: Cardiovascular;  Laterality: N/A;  . NM MYOCAR PERF WALL MOTION  02/19/2012   EF 61% ,NO SIGNIFICANT WALL MOTION ABNORMALITIES NOTED    Current Medications: Outpatient Medications Prior to Visit  Medication Sig Dispense Refill  . aspirin EC 81 MG tablet Take 81 mg by mouth daily.    .  nitroGLYCERIN (NITROSTAT) 0.4 MG SL tablet PLACE 1 TABLET UNDER TONGUE EVERY 5 MINUTES AS NEEDED FOR CHEST PAIN 25 tablet 3  . carvedilol (COREG) 6.25 MG tablet Take 1 tablet (6.25 mg total) by mouth 2 (two) times daily. 180 tablet 0  . lisinopril (PRINIVIL,ZESTRIL) 10 MG tablet Take 1 tablet (10 mg total) by mouth daily. 30 tablet 6   No facility-administered medications prior to visit.      Allergies:   Crestor [rosuvastatin calcium]   Social History   Social History  . Marital status: Married    Spouse name: N/A  . Number of children: N/A  . Years of education: N/A   Social History Main Topics  . Smoking status: Former Smoker    Quit date: 08/31/1973  . Smokeless tobacco: Never Used  . Alcohol use No  . Drug use: No  . Sexual activity: Not Asked   Other Topics Concern  . None   Social History Narrative  . None     Family History:  The patient's family history includes Hypertension in his father.   ROS:   Please see the history of present illness.    ROS All other systems reviewed and are negative.   PHYSICAL EXAM:   VS:  BP (!) 182/112   Pulse 69   Ht 5' 9.5" (1.765 m)   Wt 226 lb (102.5 kg)   BMI 32.90 kg/m    GEN: Well nourished, well developed, in no acute distress  HEENT: normal  Neck: no JVD, carotid bruits, or masses Cardiac: RRR; no murmurs, rubs, or gallops,no edema  Respiratory:  clear to auscultation bilaterally, normal work of breathing GI: soft, nontender, nondistended, + BS MS: no deformity or atrophy  Skin: warm and dry, no rash Neuro:  Alert and Oriented x 3, Strength and sensation are intact Psych: euthymic mood, full affect  Wt Readings from Last 3 Encounters:  07/05/17 226 lb (102.5 kg)  02/21/16 246 lb 3.2 oz (111.7 kg)  01/20/16 240 lb (108.9 kg)      Studies/Labs Reviewed:   EKG:  EKG is ordered today.  The ekg ordered today demonstrates Normal sinus rhythm with T-wave inversion in V3 through V6  Recent Labs: No results found  for requested labs within last 8760 hours.   Lipid Panel    Component Value Date/Time   CHOL 243 (H) 02/24/2016 0928   TRIG 217 (H) 02/24/2016 0928   HDL 37 (L) 02/24/2016 0928   CHOLHDL 6.6 (H) 02/24/2016 0928   VLDL 43 (H) 02/24/2016 0928   LDLCALC 163 (H) 02/24/2016 0928    Additional studies/ records that were reviewed today include:   Echo 12/30/2015 LV EF: 55% -   60%  Study Conclusions  - Left ventricle: The cavity size was normal. There was moderate   hypertrophy of the posterior wall and severe hypertrophy of the   septal wall. Systolic function was normal. The estimated ejection   fraction was in the range of 55% to 60%.  Wall motion was normal;   there were no regional wall motion abnormalities. Features are   consistent with a pseudonormal left ventricular filling pattern,   with concomitant abnormal relaxation and increased filling   pressure (grade 2 diastolic dysfunction). Doppler parameters are   consistent with high ventricular filling pressure. - Left atrium: The atrium was severely dilated. - Right ventricle: The cavity size was normal. Wall thickness was   normal. Systolic function was normal. - Pulmonary arteries: Systolic pressure was within the normal   range. PA peak pressure: 31 mm Hg (S). - Inferior vena cava: The vessel was normal in size. The   respirophasic diameter changes were in the normal range (>= 50%),   consistent with normal central venous pressure.   Myoview 01/2016 Study Highlights    The left ventricular ejection fraction is normal (55-65%).  Nuclear stress EF: 55%.  T wave inversion was noted during stress.  The study is normal.   Normal stress nuclear study with no ischemia or infarction; EF 55 with normal wall motion.      ASSESSMENT:    1. Uncontrolled hypertension   2. Coronary artery disease involving native coronary artery of native heart without angina pectoris   3. Hyperlipidemia, unspecified hyperlipidemia  type      PLAN:  In order of problems listed above:  1. 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 However his blood pressure is very much uncontrolled. I will increase carvedilol to 12.5 mg twice a day and lisinopril 20 mg daily. I will bring him back in 2-3 weeks for reassessment and medication titration.  2. Hyperlipidemia: He is no longer on Crestor however patient says has never tried any other statins, I will try Lipitor 20 mg daily. He will need a fasting lipid panel and LFTs in 6-8 weeks  3. CAD: No obvious angina.    Medication Adjustments/Labs and Tests Ordered: Current medicines are reviewed at length with the patient today.  Concerns regarding medicines are outlined above.  Medication changes, Labs and Tests ordered today are listed in the Patient Instructions below. Patient Instructions  Medication Instructions:   START Lipitor 20mg  DAILY  INCREASE Coreg (carvedilol) to 12.5mg  TWICE DAILY.  INCREASE Lisinopril to 20mg  DAILY  (Prescriptions have been sent to your pharmacy for the new doses.)  Labwork:   Non-fasting BMET today.  Fasting labwork (cholesterol & liver tests) in 6-8 weeks.  Testing/Procedures:  none  Follow-Up:  With Azalee Course PA in 2-3 weeks.  You have been referred for a nutrition consult. Someone will call you at home to schedule this appointment.  If you need a refill on your cardiac medications before your next appointment, please call your pharmacy.      Ramond Dial, [place]  07/06/2017 11:51 PM    [facility] [address] [place], [place], [place]  [address] Phone: [phone]; Fax: [phone]

## 2017-07-06 ENCOUNTER — Encounter: Payer: Self-pay | Admitting: Physician Assistant

## 2017-07-08 NOTE — Addendum Note (Signed)
Addended by: Chana BodeGREEN, Monserrate Blaschke L on: 07/08/2017 02:05 PM   Modules accepted: Orders

## 2017-07-23 ENCOUNTER — Ambulatory Visit: Payer: 59 | Admitting: Cardiovascular Disease

## 2017-07-30 ENCOUNTER — Encounter: Payer: Self-pay | Admitting: Physician Assistant

## 2017-07-30 ENCOUNTER — Ambulatory Visit (INDEPENDENT_AMBULATORY_CARE_PROVIDER_SITE_OTHER): Payer: BLUE CROSS/BLUE SHIELD | Admitting: Physician Assistant

## 2017-07-30 VITALS — BP 153/94 | HR 67 | Ht 69.0 in | Wt 225.2 lb

## 2017-07-30 DIAGNOSIS — E785 Hyperlipidemia, unspecified: Secondary | ICD-10-CM | POA: Diagnosis not present

## 2017-07-30 DIAGNOSIS — I10 Essential (primary) hypertension: Secondary | ICD-10-CM | POA: Diagnosis not present

## 2017-07-30 DIAGNOSIS — I2510 Atherosclerotic heart disease of native coronary artery without angina pectoris: Secondary | ICD-10-CM

## 2017-07-30 MED ORDER — LISINOPRIL 40 MG PO TABS: 40.0000 mg | ORAL_TABLET | Freq: Every day | ORAL | 6 refills | Status: DC

## 2017-07-30 MED ORDER — CARVEDILOL 25 MG PO TABS: 25.0000 mg | ORAL_TABLET | Freq: Two times a day (BID) | ORAL | 6 refills | Status: DC

## 2017-07-30 NOTE — Patient Instructions (Signed)
Medication Instructions: Your physician recommends that you continue on your current medications as directed. Please refer to the Current Medication list given to you today.  Increase Carvedilol to 25 mg twice daily.  Increase Lisinopril to 40 mg daily.  Labwork: Your physician recommends that you return for a FASTING lipid profile, hepatic function panel, and BMET in 2 weeks.  Follow-Up: Your physician recommends that you schedule a follow-up appointment in: 3 months with Dr. Tresa EndoKelly.  If you need a refill on your cardiac medications before your next appointment, please call your pharmacy.

## 2017-07-30 NOTE — Progress Notes (Signed)
Cardiology Office Note    Date:  07/31/2017   ID:  Mario Torres, DOB [birth year], MRN [id number]  PCP:  Patient, No Pcp Per  Cardiologist:  Dr. Tresa Endo  Chief Complaint  Patient presents with  . Follow-up    3 weeks; seen for Dr. Tresa Endo    History of Present Illness:  Mario Torres is a 66 y.o. male with PMH of HLD, HTN and CAD. He suffered an episode of ACS in February 2012, emergent cardiac catheterization at that time showed 95% proximal left circumflex and 90% proximal RCA lesion. He was also found to have 60% narrowing in OM1 branch of left circumflex as well as 40% LAD stenosis. He underwent acute intervention to left circumflex with a Promus stent and 3 days later underwent staged PCI to his RCA. He had Myoview in March 2013 showed normal perfusion without scar or ischemia. Repeat cardiac catheterization in August 2014 showed widely patent stents, EF 65%. He was lost to follow-up from July 2015 until January 2017 during which period he also stopped all his medication. He returned in January 2017 for follow-up. Echocardiogram obtained on 12/30/2015 showed EF 55-60%, grade 2 diastolic dysfunction, severe hypertrophy of septal wall, moderate hypertrophy of posterior wall, severely dilated left atrium, PA peak pressure 31 mmHg. Myoview obtained in February 2017 showed EF 55%, normal perfusion, no ischemia or infarction. During his last office visit in March 2017, his Coreg was increased to 6.25 mg twice a day, he was also started on lisinopril and Crestor.  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 His blood pressure was 182/112. He also discontinued to Crestor due to side effect however he has never tried other statins. We started him on Lipitor 20 mg daily as a trial. His lisinopril has also been increased to 10 mg since June 2018, I increased lisinopril further to 20 mg daily and  increased carvedilol to 12.5 mg twice a day. He presents today for readjustment of his medications.  Patient presents today for cardiology office visit, based on home blood pressure, his blood pressure ranges in the 130 to 150s at home. Occasional 160s systolic. Otherwise he has been doing well on the current medication and denies any significant discomfort. On physical exam, he does not appears to be having any heart failure signs or symptoms. His blood pressure initially on office visit was 153/94 shortly after arrival, we rechecked his blood pressure after resting for 10 minutes, his blood pressure was 130/90. I will increase his carvedilol to 25 mg twice a day and increase lisinopril to 40 mg daily, this should bring his blood pressure back to goal. We plan to obtain 2 weeks basic metabolic panel. On the same day, he is also due for fasting lipid panel and liver function test as well. He has not had any significant myalgia associated with the Lipitor which we added during the last office visit.    Past Medical History:  Diagnosis Date  . Hyperlipidemia   . Hypertension   . MI (myocardial infarction) [facility])     Past Surgical History:  Procedure Laterality Date  . BLADDER SUSPENSION    . CARDIAC CATHETERIZATION  02/06/2011   normal LV; CAD 40% mid left  LADafter thsecond diagonal;90-95% culprit stenosis prox.circ ;80-90% stenosis prox RCA.  Marland Kitchen CARDIAC CATHETERIZATION  11/24 /2003   normal cors  . CORONARY  ANGIOPLASTY  02/06/2011   large dominant left circ sytem treated w/cutting balloon arthrotomy,stenting with 3.5 x 16 mm eerolimus Promus stent,postdilated to 4.0 mm  . CORONARY STENT PLACEMENT    . DOPPLER ECHOCARDIOGRAPHY  02/07/2011   EF 50 to 55%  mild -mod posterior wall hypokinesis,pulm vein flow pattern normal    . KNEE SURGERY    . LEFT HEART CATHETERIZATION WITH CORONARY ANGIOGRAM N/A 08/10/2013   Procedure: LEFT HEART CATHETERIZATION WITH CORONARY ANGIOGRAM;  Surgeon: Lennette Biharihomas A  Kelly, MD;  [facility];  Service: Cardiovascular;  Laterality: N/A;  . NM MYOCAR PERF WALL MOTION  02/19/2012   EF 61% ,NO SIGNIFICANT WALL MOTION ABNORMALITIES NOTED    Current Medications: Outpatient Medications Prior to Visit  Medication Sig Dispense Refill  . aspirin EC 81 MG tablet Take 81 mg by mouth daily.    Marland Kitchen. atorvastatin (LIPITOR) 20 MG tablet Take 1 tablet (20 mg total) by mouth daily. 30 tablet 3  . nitroGLYCERIN (NITROSTAT) 0.4 MG SL tablet PLACE 1 TABLET UNDER TONGUE EVERY 5 MINUTES AS NEEDED FOR CHEST PAIN 25 tablet 3  . carvedilol (COREG) 12.5 MG tablet Take 1 tablet (12.5 mg total) by mouth 2 (two) times daily. 60 tablet 3  . lisinopril (PRINIVIL,ZESTRIL) 20 MG tablet Take 1 tablet (20 mg total) by mouth daily. 30 tablet 3   No facility-administered medications prior to visit.      Allergies:   Crestor [rosuvastatin calcium]   Social History   Social History  . Marital status: Married    Spouse name: N/A  . Number of children: N/A  . Years of education: N/A   Social History Main Topics  . Smoking status: Former Smoker    Quit date: 08/31/1973  . Smokeless tobacco: Never Used  . Alcohol use No  . Drug use: No  . Sexual activity: Not Asked   Other Topics Concern  . None   Social History Narrative  . None     Family History:  The patient's family history includes Hypertension in his father.   ROS:   Please see the history of present illness.    ROS All other systems reviewed and are negative.   PHYSICAL EXAM:   VS:  BP (!) 153/94   Pulse 67   Ht 5\' 9"  (1.753 m)   Wt 225 lb 3.2 oz (102.2 kg)   BMI 33.26 kg/m    GEN: Well nourished, well developed, in no acute distress  HEENT: normal  Neck: no JVD, carotid bruits, or masses Cardiac: RRR; no murmurs, rubs, or gallops,no edema  Respiratory:  clear to auscultation bilaterally, normal work of breathing GI: soft, nontender, nondistended, + BS MS: no deformity or atrophy  Skin: warm and  dry, no rash Neuro:  Alert and Oriented x 3, Strength and sensation are intact Psych: euthymic mood, full affect  Wt Readings from Last 3 Encounters:  07/30/17 225 lb 3.2 oz (102.2 kg)  07/05/17 226 lb (102.5 kg)  02/21/16 246 lb 3.2 oz (111.7 kg)      Studies/Labs Reviewed:   EKG:  EKG is not ordered today.    Recent Labs: 07/05/2017: BUN 14; Creatinine, Ser 1.01; Potassium 4.7; Sodium 139   Lipid Panel    Component Value Date/Time   CHOL 243 (H) 02/24/2016 0928   TRIG 217 (H) 02/24/2016 0928   HDL 37 (L) 02/24/2016 0928   CHOLHDL 6.6 (H) 02/24/2016 0928   VLDL 43 (H) 02/24/2016 0928   LDLCALC 163 (  H) 02/24/2016 1610    Additional studies/ records that were reviewed today include:   Echo 12/30/2015 LV EF: 55% - 60%  Study Conclusions  - Left ventricle: The cavity size was normal. There was moderate hypertrophy of the posterior wall and severe hypertrophy of the septal wall. Systolic function was normal. The estimated ejection fraction was in the range of 55% to 60%. Wall motion was normal; there were no regional wall motion abnormalities. Features are consistent with a pseudonormal left ventricular filling pattern, with concomitant abnormal relaxation and increased filling pressure (grade 2 diastolic dysfunction). Doppler parameters are consistent with high ventricular filling pressure. - Left atrium: The atrium was severely dilated. - Right ventricle: The cavity size was normal. Wall thickness was normal. Systolic function was normal. - Pulmonary arteries: Systolic pressure was within the normal range. PA peak pressure: 31 mm Hg (S). - Inferior vena cava: The vessel was normal in size. The respirophasic diameter changes were in the normal range (>= 50%), consistent with normal central venous pressure.   Myoview 01/2016 Study Highlights    The left ventricular ejection fraction is normal (55-65%).  Nuclear stress EF: 55%.  T  wave inversion was noted during stress.  The study is normal.  Normal stress nuclear study with no ischemia or infarction; EF 55 with normal wall motion.      ASSESSMENT:    1. Coronary artery disease involving native coronary artery of native heart without angina pectoris   2. Essential hypertension   3. Hyperlipidemia, unspecified hyperlipidemia type      PLAN:  In order of problems listed above:  1. 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 Initially on arrival, his blood pressure was elevated, however after resting for 10 minutes, we rechecked his blood pressure, his blood pressure was 130/90. 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 His blood pressure is still mildly elevated, I will increase carvedilol to 25 mg twice a day and increase lisinopril to 40 mg daily. He will need a basic metabolic panel in 2 weeks.  2. Hyperlipidemia: He was started on Lipitor 20 mg daily during the last office visit, he will need a fasting lipid panel and LFTs in September.  3. CAD: Denies any obvious angina   Medication Adjustments/Labs and Tests Ordered: Current medicines are reviewed at length with the patient today.  Concerns regarding medicines are outlined above.  Medication changes, Labs and Tests ordered today are listed in the Patient Instructions below. Patient Instructions  Medication Instructions: Your physician recommends that you continue on your current medications as directed. Please refer to the Current Medication list given to you today.  Increase Carvedilol to 25 mg twice daily.  Increase Lisinopril to 40 mg daily.  Labwork: Your physician recommends that you return for a FASTING lipid profile, hepatic function panel, and BMET in 2 weeks.  Follow-Up: Your physician recommends that you schedule a follow-up appointment in: 3 months with Dr. Tresa Endo.  If you need a refill on your cardiac medications  before your next appointment, please call your pharmacy.     Ramond Dial, [place]  07/31/2017 9:25 PM    [facility] [address] [place], [place], [place]  [address] Phone: [phone]; Fax: [phone]

## 2017-07-31 ENCOUNTER — Encounter: Payer: Self-pay | Admitting: Physician Assistant

## 2017-10-30 ENCOUNTER — Ambulatory Visit (INDEPENDENT_AMBULATORY_CARE_PROVIDER_SITE_OTHER): Payer: BLUE CROSS/BLUE SHIELD | Admitting: Cardiovascular Disease

## 2017-10-30 ENCOUNTER — Encounter: Payer: Self-pay | Admitting: Cardiovascular Disease

## 2017-10-30 VITALS — BP 140/86 | HR 61 | Ht 69.0 in | Wt 233.0 lb

## 2017-10-30 DIAGNOSIS — I119 Hypertensive heart disease without heart failure: Secondary | ICD-10-CM | POA: Diagnosis not present

## 2017-10-30 DIAGNOSIS — I2510 Atherosclerotic heart disease of native coronary artery without angina pectoris: Secondary | ICD-10-CM | POA: Diagnosis not present

## 2017-10-30 DIAGNOSIS — I10 Essential (primary) hypertension: Secondary | ICD-10-CM | POA: Diagnosis not present

## 2017-10-30 DIAGNOSIS — Z6834 Body mass index (BMI) 34.0-34.9, adult: Secondary | ICD-10-CM | POA: Diagnosis not present

## 2017-10-30 DIAGNOSIS — E785 Hyperlipidemia, unspecified: Secondary | ICD-10-CM | POA: Diagnosis not present

## 2017-10-30 DIAGNOSIS — I517 Cardiomegaly: Secondary | ICD-10-CM | POA: Diagnosis not present

## 2017-10-30 DIAGNOSIS — E6609 Other obesity due to excess calories: Secondary | ICD-10-CM

## 2017-10-30 DIAGNOSIS — Z79899 Other long term (current) drug therapy: Secondary | ICD-10-CM

## 2017-10-30 NOTE — Progress Notes (Signed)
Patient ID: Mario Torres, male   DOB: 1951-07-18, 66 y.o.   MRN: [id number]     HPI: Mario Torres is a 66 y.o. male who presents to the office today for an 67 month follow-up cardiology evaluation.    [name]  suffered an acute coronary syndrome in February 2012.  Emergent catheterization showed 95% proximal circumflex stenosis and 90% proximal RCA stenosis.  He was found to have 60% narrowing in the OM1 branch of the circumflex as well as 40% LAD stenosis.  He underwent acute intervention to his circumflex vessel with insertion of a Promus stent and 3 days later underwent staged intervention to his high-grade subtotal proximal RCA stenosis.  He subsequent Myoview study in March 2013 showed normal perfusion without scar or ischemia.  He was hospitalized in August 2014 with recurrent chest pain.  Repeat catheterization revealed widely patent previously placed stents.  Ejection fraction was 65%.  Subsequently, he was seen by Mario Torres in June 2015 and after having documented significant hypertension.  He was started back on medical therapy. He was seen by Mario Torres July 2015 who resumed Bystolic 5 mg and recommended reinstitution of lisinopril.  The patient has not seen a physician since.  He works part-time as a Recruitment consultant.    I had not seen him in over 2 years but saw him in January 2017, and in follow-up 2 months later in March 2017.  At that time, he has essentially had stopped taking all his medications.  His resting pulse was almost tachycardic and I instituted carvedilol initially at 3.125 mg twice a day.  I also recommended initiation of aspirin.  A Doppler study done on 12/30/2015 showed an EF of 55-60%.  There was evidence for severe LVH and grade 2 diastolic dysfunction.  Left atrium was severely dilated.  There was mild pulmonary hypertension with a PA pressure 31 mm.  A Lexiscan study revealed normal perfusion without evidence for scar or ischemia.  Ejection fraction was 55%.   Since I  last saw him in March 2017, he believes he has remained fairly stable.  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  At that time his blood pressure was elevated and his medications were further adjusted and carvedilol was increased to 25 ma twice a day, and lobe lisinopril 40 mg daily.  He has not had any repeat lab work of his lipids since 2017 when his total cholesterol was 243, LDL 163, triglycerides 217.  He has been maintained on atorvastatin 20 mg.  Presently, he denies chest pain.  He denies palpitations.  He does not exercise except cutting his grass.  He presents for follow-up evaluation.  Past Medical History:  Diagnosis Date  . Hyperlipidemia   . Hypertension   . MI (myocardial infarction) [facility])     Past Surgical History:  Procedure Laterality Date  . BLADDER SUSPENSION    . CARDIAC CATHETERIZATION  02/06/2011   normal LV; CAD 40% mid left  LADafter thsecond diagonal;90-95% culprit stenosis prox.circ ;80-90% stenosis prox RCA.  Marland Kitchen CARDIAC CATHETERIZATION  11/24 /2003   normal cors  . CORONARY ANGIOPLASTY  02/06/2011   large dominant left circ sytem treated w/cutting balloon arthrotomy,stenting with 3.5 x 16 mm eerolimus Promus stent,postdilated to 4.0 mm  . CORONARY STENT PLACEMENT    . DOPPLER ECHOCARDIOGRAPHY  02/07/2011   EF 50 to 55%  mild -mod  posterior wall hypokinesis,pulm vein flow pattern normal    . KNEE SURGERY    . NM MYOCAR PERF WALL MOTION  02/19/2012   EF 61% ,NO SIGNIFICANT WALL MOTION ABNORMALITIES NOTED    Allergies  Allergen Reactions  . Crestor [Rosuvastatin Calcium]     Joint pain     Current Outpatient Medications  Medication Sig Dispense Refill  . aspirin EC 81 MG tablet Take 81 mg by mouth daily.    . carvedilol (COREG) 25 MG tablet Take 1 tablet (25 mg total) by mouth 2 (two) times daily with a meal. 60 tablet 6  . lisinopril (PRINIVIL,ZESTRIL) 40 MG tablet  Take 1 tablet (40 mg total) by mouth daily. 30 tablet 6  . nitroGLYCERIN (NITROSTAT) 0.4 MG SL tablet PLACE 1 TABLET UNDER TONGUE EVERY 5 MINUTES AS NEEDED FOR CHEST PAIN 25 tablet 3  . atorvastatin (LIPITOR) 20 MG tablet Take 1 tablet (20 mg total) by mouth daily. 30 tablet 3   No current facility-administered medications for this visit.     Social History   Socioeconomic History  . Marital status: Married    Spouse name: Not on file  . Number of children: Not on file  . Years of education: Not on file  . Highest education level: Not on file  Social Needs  . Financial resource strain: Not on file  . Food insecurity - worry: Not on file  . Food insecurity - inability: Not on file  . Transportation needs - medical: Not on file  . Transportation needs - non-medical: Not on file  Occupational History  . Not on file  Tobacco Use  . Smoking status: Former Smoker    Last attempt to quit: 08/31/1973    Years since quitting: 44.1  . Smokeless tobacco: Never Used  Substance and Sexual Activity  . Alcohol use: No    Alcohol/week: 0.0 oz  . Drug use: No  . Sexual activity: Not on file  Other Topics Concern  . Not on file  Social History Narrative  . Not on file   Socially he is married 74 years.  He has 2 children and 8 grandchildren.  He works part-time as a Teacher, early years/pre.  There is no tobacco or alcohol use.  Family History  Problem Relation Age of Onset  . Hypertension Father    Family history is notable in that his father died as result of liver cancer.  Mother died secondary to suicide.  ROS General: Negative; No fevers, chills, or night sweats HEENT: Positive for poor dentition; No changes in vision or hearing, sinus congestion, difficulty swallowing Pulmonary: Negative; No cough, wheezing, shortness of breath, hemoptysis Cardiovascular: See HPI: No chest pain, presyncope, syncope, palpatations GI: Negative; No nausea, vomiting, diarrhea, or abdominal pain GU:  Negative; No dysuria, hematuria, or difficulty voiding Musculoskeletal: Negative; no myalgias, joint pain, or weakness Hematologic: Negative; no easy bruising, bleeding Endocrine: Negative; no heat/cold intolerance; no diabetes, Neuro: Negative; no changes in balance, headaches Skin: Negative; No rashes or skin lesions Psychiatric: Negative; No behavioral problems, depression Sleep: Negative; No snoring,  daytime sleepiness, hypersomnolence, bruxism, restless legs, hypnogognic hallucinations. Other comprehensive 14 point Torres review is negative   Physical Exam BP 140/86   Pulse 61   Ht '5\' 9"'  (1.753 m)   Wt 233 lb (105.7 kg)   BMI 34.41 kg/m    Repeat blood pressure by me was improved at 126/86   Wt Readings from Last 3 Encounters:  10/30/17 233 lb (105.7  kg)  07/30/17 225 lb 3.2 oz (102.2 kg)  07/05/17 226 lb (102.5 kg)   General: Alert, oriented, no distress. Bearded. Skin: normal turgor, no rashes, warm and dry HEENT: Normocephalic, atraumatic. Pupils equal round and reactive to light; sclera anicteric; extraocular muscles intact;  Nose without nasal septal hypertrophy Mouth/Parynx benign; Mallinpatti scale 3 Neck: No JVD, no carotid bruits; normal carotid upstroke Lungs: clear to ausculatation and percussion; no wheezing or rales Chest wall: without tenderness to palpitation Heart: PMI not displaced, RRR, s1 s2 normal, 8-6/1 systolic murmur, no diastolic murmur, no rubs, gallops, thrills, or heaves Abdomen: soft, nontender; no hepatosplenomehaly, BS+; abdominal aorta nontender and not dilated by palpation. Back: no CVA tenderness Pulses 2+ Musculoskeletal: full range of motion, normal strength, no joint deformities Extremities: no clubbing cyanosis or edema, Homan's sign negative  Neurologic: grossly nonfocal; Cranial nerves grossly wnl Psychologic: Normal mood and affect  ECG (independently read by me): Normal sinus rhythm at 61 bpm.  Incomplete right bundle branch  block.  LVH with repolarization changes.  QTc interval 465 ms.  March 2017 ECG (independently read by me): Normal sinus rhythm at 80 bpm.  Right bundle-branch block with repolarization changes.  QTc interval 452 ms.  Prior ECG (independently read by me): Normal sinus rhythm at 99 bpm.  Right bundle-branch block, left axis deviation.   LABS:  BMP Latest Ref Rng & Units 07/05/2017 02/24/2016 08/25/2013  Glucose 65 - 99 mg/dL 68 105(H) 115(H)  BUN 8 - 27 mg/dL '14 13 16  ' Creatinine 0.76 - 1.27 mg/dL 1.01 0.94 1.03  BUN/Creat Ratio 10 - 24 14 - -  Sodium 134 - 144 mmol/L 139 137 136  Potassium 3.5 - 5.2 mmol/L 4.7 4.9 4.9  Chloride 96 - 106 mmol/L 100 103 101  CO2 20 - 29 mmol/L '22 25 25  ' Calcium 8.6 - 10.2 mg/dL 9.8 9.5 9.9     Hepatic Function Latest Ref Rng & Units 02/24/2016  Total Protein 6.1 - 8.1 g/dL 6.9  Albumin 3.6 - 5.1 g/dL 4.3  AST 10 - 35 U/L 25  ALT 9 - 46 U/L 28  Alk Phosphatase 40 - 115 U/L 74  Total Bilirubin 0.2 - 1.2 mg/dL 0.7    CBC Latest Ref Rng & Units 02/24/2016 08/25/2013 08/11/2013  WBC 4.0 - 10.5 K/uL 7.1 7.2 5.7  Hemoglobin 13.0 - 17.0 g/dL 16.8 15.6 16.0  Hematocrit 39.0 - 52.0 % 47.5 43.0 46.2  Platelets 150 - 400 K/uL 154 176 128(L)   Lab Results  Component Value Date   MCV 90.8 02/24/2016   MCV 89.6 08/25/2013   MCV 90.6 08/11/2013    Lab Results  Component Value Date   TSH 1.69 02/24/2016    BNP No results found for: BNP  ProBNP    Component Value Date/Time   PROBNP 99.1 08/25/2013 1400     Lipid Panel     Component Value Date/Time   CHOL 243 (H) 02/24/2016 0928   TRIG 217 (H) 02/24/2016 0928   HDL 37 (L) 02/24/2016 0928   CHOLHDL 6.6 (H) 02/24/2016 0928   VLDL 43 (H) 02/24/2016 0928   LDLCALC 163 (H) 02/24/2016 4830     RADIOLOGY: No results found.  IMPRESSION:  1. Coronary artery disease involving native coronary artery of native heart without angina pectoris   2. Essential hypertension   3. Hyperlipidemia, unspecified  hyperlipidemia type   4. LVH (left ventricular hypertrophy)   5. Medication management   6. Class 1 obesity due  to excess calories with serious comorbidity and body mass index (BMI) of 34.0 to 34.9 in adult   7. Hypertensive heart disease without heart failure     ASSESSMENT AND PLAN: Mario Torres is a 66 year old white male who suffered an acute coronary syndrome in February 2012 at which time he underwent successful stenting of a subtotal proximal circumflex stenosis.  He underwent staged intervention to high-grade proximal RCA stenosis.  Several days later and subsequently was found to have significant myocardial salvage with fairly normal perfusion without scar or ischemia on a Myoview study in March 2013.  At his last catheterization in August 2014 both stents were widely patent.  In January 2017, he underwent an echo Doppler study which revealed severe LVH with grade 2 diastolic dysfunction without significant valvular pathology.  He had mild elevation of pulmonary pressures.  A nuclear perfusion study showed normal perfusion.  Over the past year and a half, his medications have been titrated such that most recently since August 2018.  He has been on lisinopril at 40 mg in addition to carvedilol 25 mg twice a day.  His blood pressure today initially was mildly elevated, but on repeat by me was actually optimal and less than 6:28 systolic.  He has been maintained on atorvastatin 20 mg for hyperlipidemia.  He's not had follow-up laboratory since March 2017 regarding his lipid studies.  At that time, he had significant LDL elevation.  A complete set of fasting laboratory will be obtained on his current therapy and adjustments to his medications will be made.  He does not exercise.  I discussed with him the American Heart Association recommendations of exercising at least 5 days a week for 30 minutes with at least moderate intensity.  I discussed weight loss.  His BMI is 34.4.  We discussed setting a  weight goal of at least 1 pound per week such that when I see him in 6 months.  He will hopefully have lost close to 25 pounds.  Prior to that evaluation.  I will recheck an echo Doppler study to reassess his LVH and diastolic function, particularly with development of repolarization changes on his ECG.  I'll see him back at that time and further recommendations will be made.   Time spent: 25 minutes Mario Sine, MD, [facility]  10/30/2017 10:38 AM

## 2017-10-30 NOTE — Patient Instructions (Signed)
Medication Instructions:  Your physician recommends that you continue on your current medications as directed. Please refer to the Current Medication list given to you today.  Labwork: Please return for FASTING labs (CMET, CBC, Lipid, TSH)-lab slips provided.  Testing/Procedures: Your physician has requested that you have an echocardiogram in 6 MONTHS. Echocardiography is a painless test that uses sound waves to create images of your heart. It provides your doctor with information about the size and shape of your heart and how well your heart's chambers and valves are working. This procedure takes approximately one hour. There are no restrictions for this procedure.  [facility]  [employer]  Follow-Up: Your physician wants you to follow-up in: 6 months with Dr. Tresa EndoKelly (after echo). You will receive a reminder letter in the mail two months in advance. If you don't receive a letter, please call our office to schedule the follow-up appointment.   If you need a refill on your cardiac medications before your next appointment, please call your pharmacy.

## 2017-11-15 ENCOUNTER — Other Ambulatory Visit: Payer: Self-pay | Admitting: *Deleted

## 2017-11-15 MED ORDER — NITROGLYCERIN 0.4 MG SL SUBL: 0.4000 mg | SUBLINGUAL_TABLET | SUBLINGUAL | 3 refills | Status: DC | PRN

## 2018-02-15 ENCOUNTER — Other Ambulatory Visit: Payer: Self-pay | Admitting: Physician Assistant

## 2018-02-17 NOTE — Telephone Encounter (Signed)
Please review for refill. Thanks!  

## 2018-04-29 ENCOUNTER — Other Ambulatory Visit (HOSPITAL_COMMUNITY): Payer: BLUE CROSS/BLUE SHIELD

## 2018-05-17 ENCOUNTER — Other Ambulatory Visit: Payer: Self-pay | Admitting: Physician Assistant

## 2018-05-19 NOTE — Telephone Encounter (Signed)
Rx sent to pharmacy   

## 2018-06-03 ENCOUNTER — Ambulatory Visit (HOSPITAL_COMMUNITY): Payer: Medicare Other | Attending: Cardiovascular Disease

## 2018-06-03 ENCOUNTER — Other Ambulatory Visit: Payer: Self-pay

## 2018-06-03 DIAGNOSIS — E785 Hyperlipidemia, unspecified: Secondary | ICD-10-CM | POA: Insufficient documentation

## 2018-06-03 DIAGNOSIS — I252 Old myocardial infarction: Secondary | ICD-10-CM | POA: Diagnosis not present

## 2018-06-03 DIAGNOSIS — I517 Cardiomegaly: Secondary | ICD-10-CM | POA: Diagnosis present

## 2018-06-03 DIAGNOSIS — I119 Hypertensive heart disease without heart failure: Secondary | ICD-10-CM | POA: Diagnosis not present

## 2018-07-04 ENCOUNTER — Other Ambulatory Visit: Payer: Self-pay | Admitting: Physician Assistant

## 2018-07-04 NOTE — Telephone Encounter (Signed)
Rx sent to pharmacy   

## 2018-07-30 ENCOUNTER — Other Ambulatory Visit: Payer: Self-pay | Admitting: Cardiovascular Disease

## 2018-07-30 NOTE — Telephone Encounter (Signed)
Rx request sent to pharmacy.  

## 2018-10-09 ENCOUNTER — Other Ambulatory Visit: Payer: Self-pay | Admitting: Cardiovascular Disease

## 2018-10-18 ENCOUNTER — Other Ambulatory Visit: Payer: Self-pay | Admitting: Cardiovascular Disease

## 2018-10-20 NOTE — Telephone Encounter (Signed)
Rx(s) sent to pharmacy electronically.  

## 2018-10-22 ENCOUNTER — Other Ambulatory Visit: Payer: Self-pay | Admitting: Cardiovascular Disease

## 2018-10-24 ENCOUNTER — Other Ambulatory Visit: Payer: Self-pay | Admitting: Cardiovascular Disease

## 2018-11-05 ENCOUNTER — Ambulatory Visit (INDEPENDENT_AMBULATORY_CARE_PROVIDER_SITE_OTHER): Payer: Medicare Other | Admitting: Physician Assistant

## 2018-11-05 ENCOUNTER — Encounter: Payer: Self-pay | Admitting: Physician Assistant

## 2018-11-05 VITALS — BP 117/72 | HR 51 | Ht 69.0 in | Wt 240.6 lb

## 2018-11-05 DIAGNOSIS — I2510 Atherosclerotic heart disease of native coronary artery without angina pectoris: Secondary | ICD-10-CM | POA: Diagnosis not present

## 2018-11-05 DIAGNOSIS — I10 Essential (primary) hypertension: Secondary | ICD-10-CM | POA: Diagnosis not present

## 2018-11-05 DIAGNOSIS — E785 Hyperlipidemia, unspecified: Secondary | ICD-10-CM | POA: Diagnosis not present

## 2018-11-05 MED ORDER — LISINOPRIL 40 MG PO TABS: 40.0000 mg | ORAL_TABLET | Freq: Every day | ORAL | 3 refills | Status: DC

## 2018-11-05 MED ORDER — ATORVASTATIN CALCIUM 20 MG PO TABS: 20.0000 mg | ORAL_TABLET | Freq: Every day | ORAL | 3 refills | Status: DC

## 2018-11-05 MED ORDER — NITROGLYCERIN 0.4 MG SL SUBL: 0.4000 mg | SUBLINGUAL_TABLET | SUBLINGUAL | 3 refills | Status: DC | PRN

## 2018-11-05 MED ORDER — CARVEDILOL 25 MG PO TABS: 25.0000 mg | ORAL_TABLET | Freq: Two times a day (BID) | ORAL | 3 refills | Status: DC

## 2018-11-05 NOTE — Progress Notes (Signed)
Cardiology Office Note    Date:  11/05/2018   ID:  Mario Torres E Tucciarone, DOB [birth year], MRN [id number]  PCP:  Patient, No Pcp Per  Cardiologist:  Dr. Tresa EndoKelly   Chief Complaint  Patient presents with  . Follow-up    seen for Dr. Tresa EndoKelly.     History of Present Illness:  Mario Torres E Vilardi is a 67 y.o. male with PMH of HLD, HTN and CAD. He suffered an episode of ACS in February 2012, emergent cardiac catheterization at that time showed 95% proximal left circumflex and 90% proximal RCA lesion. He was also found to have 60% narrowing in OM1 branch of left circumflex as well as 40% LAD stenosis. He underwent acute intervention to left circumflex with a Promus stent and 3 days later underwent staged PCI to his RCA. He had Myoview in March 2013 showed normal perfusion without scar or ischemia. Repeat cardiac catheterization in August 2014 showed widely patent stents, EF 65%. He was lost to follow-up from July 2015 until January 2017 during which period he also stopped all his medication. He returned in January 2017 for follow-up. Echocardiogram obtained on 12/30/2015 showed EF 55-60%, grade 2 diastolic dysfunction, severe hypertrophy of septal wall, moderate hypertrophy of posterior wall, severely dilated left atrium, PA peak pressure 31 mmHg. Myoview obtained in February 2017 showed EF 55%, normal perfusion, no ischemia or infarction.   I last saw the patient in August 2018, he was doing well at the time.  I increased his carvedilol to 25 mg twice daily and lisinopril to 40 mg daily to help control his blood pressure.  He had a repeat echocardiogram on 06/03/2018 which showed EF 65 to 70%, moderate LVH, grade 2 DD, mild MR. patient presents today for cardiology office visit.  He has been doing well for the past year without any chest pain or shortness of breath.  EKG continued to demonstrate a sinus bradycardia with T wave inversions in anterior leads that is unchanged compared to last year.     Past Medical  History:  Diagnosis Date  . Hyperlipidemia   . Hypertension   . MI (myocardial infarction) [facility](HCC)     Past Surgical History:  Procedure Laterality Date  . BLADDER SUSPENSION    . CARDIAC CATHETERIZATION  02/06/2011   normal LV; CAD 40% mid left  LADafter thsecond diagonal;90-95% culprit stenosis prox.circ ;80-90% stenosis prox RCA.  Marland Kitchen. CARDIAC CATHETERIZATION  11/24 /2003   normal cors  . CORONARY ANGIOPLASTY  02/06/2011   large dominant left circ sytem treated w/cutting balloon arthrotomy,stenting with 3.5 x 16 mm eerolimus Promus stent,postdilated to 4.0 mm  . CORONARY STENT PLACEMENT    . DOPPLER ECHOCARDIOGRAPHY  02/07/2011   EF 50 to 55%  mild -mod posterior wall hypokinesis,pulm vein flow pattern normal    . KNEE SURGERY    . LEFT HEART CATHETERIZATION WITH CORONARY ANGIOGRAM N/A 08/10/2013   Procedure: LEFT HEART CATHETERIZATION WITH CORONARY ANGIOGRAM;  Surgeon: Lennette Biharihomas A Kelly, MD;  [facility];  Service: Cardiovascular;  Laterality: N/A;  . NM MYOCAR PERF WALL MOTION  02/19/2012   EF 61% ,NO SIGNIFICANT WALL MOTION ABNORMALITIES NOTED    Current Medications: Outpatient Medications Prior to Visit  Medication Sig Dispense Refill  . aspirin EC 81 MG tablet Take 81 mg by mouth daily.    Marland Kitchen. atorvastatin (LIPITOR) 20 MG tablet TAKE 1 TABLET BY MOUTH EVERY DAY 30 tablet 8  . carvedilol (COREG) 25 MG tablet Take 1 tablet (25  mg total) by mouth 2 (two) times daily with a meal. Please schedule yearly appt for more refills, thanks! 1st attmpt 60 tablet 0  . lisinopril (PRINIVIL,ZESTRIL) 40 MG tablet TAKE 1 TABLET (40 MG TOTAL) BY MOUTH DAILY. PLEASE CALL TO MAKE APPOINTMENT FOR ANY FURTHER REFILLS. 15 tablet 0  . nitroGLYCERIN (NITROSTAT) 0.4 MG SL tablet Place 1 tablet (0.4 mg total) under the tongue every 5 (five) minutes x 3 doses as needed for chest pain. 25 tablet 3   No facility-administered medications prior to visit.      Allergies:   Crestor [rosuvastatin calcium]    Social History   Socioeconomic History  . Marital status: Married    Spouse name: Not on file  . Number of children: Not on file  . Years of education: Not on file  . Highest education level: Not on file  Occupational History  . Not on file  Social Needs  . Financial resource strain: Not on file  . Food insecurity:    Worry: Not on file    Inability: Not on file  . Transportation needs:    Medical: Not on file    Non-medical: Not on file  Tobacco Use  . Smoking status: Former Smoker    Last attempt to quit: 08/31/1973    Years since quitting: 45.2  . Smokeless tobacco: Never Used  Substance and Sexual Activity  . Alcohol use: No    Alcohol/week: 0.0 standard drinks  . Drug use: No  . Sexual activity: Not on file  Lifestyle  . Physical activity:    Days per week: Not on file    Minutes per session: Not on file  . Stress: Not on file  Relationships  . Social connections:    Talks on phone: Not on file    Gets together: Not on file    Attends religious service: Not on file    Active member of club or organization: Not on file    Attends meetings of clubs or organizations: Not on file    Relationship status: Not on file  Other Topics Concern  . Not on file  Social History Narrative  . Not on file     Family History:  The patient's family history includes Hypertension in his father.   ROS:   Please see the history of present illness.    ROS All other systems reviewed and are negative.   PHYSICAL EXAM:   VS:  BP 117/72   Pulse (!) 51   Ht 5\' 9"  (1.753 m)   Wt 240 lb 9.6 oz (109.1 kg)   BMI 35.53 kg/m    GEN: Well nourished, well developed, in no acute distress  HEENT: normal  Neck: no JVD, carotid bruits, or masses Cardiac: RRR; no murmurs, rubs, or gallops,no edema  Respiratory:  clear to auscultation bilaterally, normal work of breathing GI: soft, nontender, nondistended, + BS MS: no deformity or atrophy  Skin: warm and dry, no rash Neuro:  Alert  and Oriented x 3, Strength and sensation are intact Psych: euthymic mood, full affect  Wt Readings from Last 3 Encounters:  11/05/18 240 lb 9.6 oz (109.1 kg)  10/30/17 233 lb (105.7 kg)  07/30/17 225 lb 3.2 oz (102.2 kg)      Studies/Labs Reviewed:   EKG:  EKG is ordered today.  The ekg ordered today demonstrates sinus bradycardia with T wave inversion in anterolateral leads  Recent Labs: No results found for requested labs within last 8760 hours.  Lipid Panel    Component Value Date/Time   CHOL 243 (H) 02/24/2016 0928   TRIG 217 (H) 02/24/2016 0928   HDL 37 (L) 02/24/2016 0928   CHOLHDL 6.6 (H) 02/24/2016 0928   VLDL 43 (H) 02/24/2016 0928   LDLCALC 163 (H) 02/24/2016 0928    Additional studies/ records that were reviewed today include:   Echo 06/03/2018 LV EF: 65% -   70% Study Conclusions  - Left ventricle: The cavity size was normal. Wall thickness was   increased in a pattern of moderate LVH. Systolic function was   vigorous. The estimated ejection fraction was in the range of 65%   to 70%. Wall motion was normal; there were no regional wall   motion abnormalities. Features are consistent with a pseudonormal   left ventricular filling pattern, with concomitant abnormal   relaxation and increased filling pressure (grade 2 diastolic   dysfunction). - Aortic valve: Mildly calcified annulus. - Mitral valve: There was mild regurgitation.    ASSESSMENT:    1. Coronary artery disease involving native coronary artery of native heart without angina pectoris   2. Essential hypertension   3. Hyperlipidemia, unspecified hyperlipidemia type      PLAN:  In order of problems listed above:  1. CAD: On aspirin and statin.  Denies any obvious exertional chest pain.  Obtain CMP, CBC  2. Hypertension: Blood pressure stable on current therapy.  Continue carvedilol and lisinopril  3. Hyperlipidemia: On Lipitor 20 mg daily.  Due for fasting lipid panel     Medication  Adjustments/Labs and Tests Ordered: Current medicines are reviewed at length with the patient today.  Concerns regarding medicines are outlined above.  Medication changes, Labs and Tests ordered today are listed in the Patient Instructions below. Patient Instructions  Medication Instructions:  Your physician recommends that you continue on your current medications as directed. Please refer to the Current Medication list given to you today. If you need a refill on your cardiac medications before your next appointment, please call your pharmacy.   Lab work: Your physician recommends that you return for lab work in: next week 11/25 to 11/27 FASTING LIPID, CMET, CBC  If you have labs (blood work) drawn today and your tests are completely normal, you will receive your results only by: Marland Kitchen MyChart Message (if you have MyChart) OR . A paper copy in the mail If you have any lab test that is abnormal or we need to change your treatment, we will call you to review the results.  Testing/Procedures: NONE   Follow-Up: At [facility], you and your health needs are our priority.  As part of our continuing mission to provide you with exceptional heart care, we have created designated Provider Care Teams.  These Care Teams include your primary Cardiologist (physician) and Advanced Practice Providers (APPs -  Physician Assistants and Nurse Practitioners) who all work together to provide you with the care you need, when you need it. You will need a follow up appointment in 6 months.  Please call our office 2 months in advance to schedule this appointment.  You may see Dr Nicki Guadalajara or one of the following Advanced Practice Providers on your designated Care Team: [place], [place] . Micah Flesher, PA-C  Any Other Special Instructions Will Be Listed Below (If Applicable).      Ramond Dial, [place]  11/05/2018 5:21 PM    [facility] [address] [place], [place], [place]  [address] Phone:  [phone]; Fax: [phone]

## 2018-11-05 NOTE — Patient Instructions (Signed)
Medication Instructions:  Your physician recommends that you continue on your current medications as directed. Please refer to the Current Medication list given to you today. If you need a refill on your cardiac medications before your next appointment, please call your pharmacy.   Lab work: Your physician recommends that you return for lab work in: next week 11/25 to 11/27 FASTING LIPID, CMET, CBC  If you have labs (blood work) drawn today and your tests are completely normal, you will receive your results only by: Marland Kitchen. MyChart Message (if you have MyChart) OR . A paper copy in the mail If you have any lab test that is abnormal or we need to change your treatment, we will call you to review the results.  Testing/Procedures: NONE   Follow-Up: At [facility], you and your health needs are our priority.  As part of our continuing mission to provide you with exceptional heart care, we have created designated Provider Care Teams.  These Care Teams include your primary Cardiologist (physician) and Advanced Practice Providers (APPs -  Physician Assistants and Nurse Practitioners) who all work together to provide you with the care you need, when you need it. You will need a follow up appointment in 6 months.  Please call our office 2 months in advance to schedule this appointment.  You may see Dr Nicki Guadalajarahomas Kelly or one of the following Advanced Practice Providers on your designated Care Team: [place], [place] . Micah FlesherAngela Duke, PA-C  Any Other Special Instructions Will Be Listed Below (If Applicable).

## 2018-11-12 LAB — COMPREHENSIVE METABOLIC PANEL
ALBUMIN: 4.3 g/dL (ref 3.6–4.8)
ALT: 27 IU/L (ref 0–44)
AST: 23 IU/L (ref 0–40)
Albumin/Globulin Ratio: 1.8 (ref 1.2–2.2)
Alkaline Phosphatase: 69 IU/L (ref 39–117)
BUN / CREAT RATIO: 11 (ref 10–24)
BUN: 12 mg/dL (ref 8–27)
Bilirubin Total: 0.7 mg/dL (ref 0.0–1.2)
CALCIUM: 9.6 mg/dL (ref 8.6–10.2)
CO2: 24 mmol/L (ref 20–29)
CREATININE: 1.1 mg/dL (ref 0.76–1.27)
Chloride: 101 mmol/L (ref 96–106)
GFR calc Af Amer: 80 mL/min/{1.73_m2} (ref >59)
GFR calc non Af Amer: 69 mL/min/{1.73_m2} (ref >59)
GLOBULIN, TOTAL: 2.4 g/dL (ref 1.5–4.5)
Glucose: 111 mg/dL — ABNORMAL HIGH (ref 65–99)
Potassium: 5.1 mmol/L (ref 3.5–5.2)
Sodium: 138 mmol/L (ref 134–144)
TOTAL PROTEIN: 6.7 g/dL (ref 6.0–8.5)

## 2018-11-12 LAB — CBC
Hematocrit: 46.7 % (ref 37.5–51.0)
Hemoglobin: 16.3 g/dL (ref 13.0–17.7)
MCH: 31.7 pg (ref 26.6–33.0)
MCHC: 34.9 g/dL (ref 31.5–35.7)
MCV: 91 fL (ref 79–97)
Platelets: 164 10*3/uL (ref 150–450)
RBC: 5.14 x10E6/uL (ref 4.14–5.80)
RDW: 12.7 % (ref 12.3–15.4)
WBC: 7 10*3/uL (ref 3.4–10.8)

## 2018-11-12 LAB — LIPID PANEL
CHOLESTEROL TOTAL: 170 mg/dL (ref 100–199)
Chol/HDL Ratio: 4.4 ratio (ref 0.0–5.0)
HDL: 39 mg/dL — ABNORMAL LOW (ref >39)
LDL Calculated: 79 mg/dL (ref 0–99)
Triglycerides: 258 mg/dL — ABNORMAL HIGH (ref 0–149)
VLDL Cholesterol Cal: 52 mg/dL — ABNORMAL HIGH (ref 5–40)

## 2018-11-17 ENCOUNTER — Other Ambulatory Visit: Payer: Self-pay

## 2018-11-17 DIAGNOSIS — E785 Hyperlipidemia, unspecified: Secondary | ICD-10-CM

## 2018-11-18 MED ORDER — FISH OIL 1000 MG PO CAPS: 1.0000 | ORAL_CAPSULE | Freq: Every day | ORAL | 6 refills | Status: AC

## 2018-11-18 NOTE — Addendum Note (Signed)
Addended by: Kandice RobinsonsYOUNG, Efe Fazzino T on: 11/18/2018 02:44 PM   Modules accepted: Orders

## 2018-12-02 ENCOUNTER — Ambulatory Visit: Payer: Medicare Other | Admitting: Family Medicine

## 2018-12-02 ENCOUNTER — Encounter: Payer: Self-pay | Admitting: Family Medicine

## 2018-12-02 VITALS — BP 120/74 | HR 63 | Temp 97.7°F | Ht 69.0 in | Wt 243.2 lb

## 2018-12-02 DIAGNOSIS — I10 Essential (primary) hypertension: Secondary | ICD-10-CM | POA: Diagnosis not present

## 2018-12-02 DIAGNOSIS — N529 Male erectile dysfunction, unspecified: Secondary | ICD-10-CM | POA: Diagnosis not present

## 2018-12-02 DIAGNOSIS — M79674 Pain in right toe(s): Secondary | ICD-10-CM

## 2018-12-02 DIAGNOSIS — K649 Unspecified hemorrhoids: Secondary | ICD-10-CM | POA: Diagnosis not present

## 2018-12-02 DIAGNOSIS — E785 Hyperlipidemia, unspecified: Secondary | ICD-10-CM | POA: Diagnosis not present

## 2018-12-02 MED ORDER — HYDROCORTISONE ACETATE 25 MG RE SUPP: 25.0000 mg | Freq: Two times a day (BID) | RECTAL | 0 refills | Status: DC

## 2018-12-02 NOTE — Assessment & Plan Note (Signed)
Continue Coreg 25 twice daily and lisinopril 40 mg daily.

## 2018-12-02 NOTE — Patient Instructions (Signed)
It was very nice to see you today!  You have mild hemorrhoids.  Please use the hydrocortisone twice daily for the next 5 to 7 days.  Please make sure you are getting plenty of fiber in your diet.  You can take a fiber supplement as needed.  Please also make sure that you are getting plenty of fluids.  You may use a stool softener as well.  The goal would be for you to have at least 1-2 soft bowel movements per day to prevent this from happening in the future.  I think you have a small amount of arthritis in your big toe.  Please let me know if this becomes problematic and we can get you into see our sports medicine doctor, Dr. Berline Choughigby.  I will see you back in the summer for your annual physical, or sooner as needed.  Take care, Dr Jimmey RalphParker

## 2018-12-02 NOTE — Assessment & Plan Note (Signed)
Patient's cardiologist told him to avoid Viagra and Cialis.  Recommended urology referral to discuss other treatment options however patient declined.

## 2018-12-02 NOTE — Progress Notes (Signed)
Xa

## 2018-12-02 NOTE — Assessment & Plan Note (Signed)
LDL 79 few weeks ago.  Continue Lipitor 20 mg daily.

## 2018-12-02 NOTE — Progress Notes (Signed)
Subjective:  Mario Torres is a 67 y.o. male who presents today with a chief complaint of hemorrhoids and to establish care.   HPI:  Hemorrhoids Started about a year ago.  Worsened last couple of weeks.  Has some pain and staying to the area.  Also very pruritic.  Has tried Preparation H with no improvement.  Stool is frequently hard and difficult to pass.  He has never had this treated before.  No other treatments tried.  Occasionally has red blood in the stool.  No nausea or vomiting.  Normal appetite.  No weight loss.  Toe Pain Several year history.  Stable.  Located in right great toe.  No obvious precipitating events.  Occasionally worse with walking.  No specific treatments tried.  Erectile dysfunction Also a several year history.  Is been on Viagra in the past which worked well.  He unfortunately had a heart attack for 5 years ago does not been on any medication since then.  Reportedly cardiologist told him that he should not take any medication similar to Viagra due to his heart health.  His other chronic, stable medical conditions are outlined below  1.  Hypertension.  On Coreg 25 mg twice daily and lisinopril 40 mg daily.  Tolerating well. 2.  Dyslipidemia.  On Lipitor 20 mg daily and tolerating well.  ROS: Per HPI, otherwise a complete review of systems was negative.   PMH:  The following were reviewed and entered/updated in epic: Past Medical History:  Diagnosis Date  . Hyperlipidemia   . Hypertension   . MI (myocardial infarction) [facility](HCC)    2012   Patient Active Problem List   Diagnosis Date Noted  . Erectile dysfunction 12/02/2018  . Hemorrhoids 12/02/2018  . Pain of right great toe 12/02/2018  . GERD (gastroesophageal reflux disease) 08/09/2013  . CAD- s/p PCI w/ DES to circumflex, and RCA in 2012- patent stents on re-look cath 08/10/13 08/07/2013  . HTN (hypertension) 08/07/2013  . HLD (hyperlipidemia) 08/07/2013  . History of non-ST elevation myocardial  infarction (NSTEMI) -2012 08/07/2013   Past Surgical History:  Procedure Laterality Date  . CARDIAC CATHETERIZATION  02/06/2011   normal LV; CAD 40% mid left  LADafter thsecond diagonal;90-95% culprit stenosis prox.circ ;80-90% stenosis prox RCA.  Marland Kitchen. CARDIAC CATHETERIZATION  11/24 /2003   normal cors  . CORONARY ANGIOPLASTY  02/06/2011   large dominant left circ sytem treated w/cutting balloon arthrotomy,stenting with 3.5 x 16 mm eerolimus Promus stent,postdilated to 4.0 mm  . CORONARY STENT PLACEMENT    . DOPPLER ECHOCARDIOGRAPHY  02/07/2011   EF 50 to 55%  mild -mod posterior wall hypokinesis,pulm vein flow pattern normal    . KNEE SURGERY    . LEFT HEART CATHETERIZATION WITH CORONARY ANGIOGRAM N/A 08/10/2013   Procedure: LEFT HEART CATHETERIZATION WITH CORONARY ANGIOGRAM;  Surgeon: Lennette Biharihomas A Kelly, MD;  [facility];  Service: Cardiovascular;  Laterality: N/A;  . NM MYOCAR PERF WALL MOTION  02/19/2012   EF 61% ,NO SIGNIFICANT WALL MOTION ABNORMALITIES NOTED    Family History  Problem Relation Age of Onset  . Hypertension Father   . Alcohol abuse Father   . Cirrhosis Father   . Stomach cancer Father   . Prostate cancer Neg Hx   . Colon cancer Neg Hx     Medications- reviewed and updated Current Outpatient Medications  Medication Sig Dispense Refill  . aspirin EC 81 MG tablet Take 81 mg by mouth daily.    .Marland Kitchen  atorvastatin (LIPITOR) 20 MG tablet Take 1 tablet (20 mg total) by mouth daily. 90 tablet 3  . carvedilol (COREG) 25 MG tablet Take 1 tablet (25 mg total) by mouth 2 (two) times daily with a meal. 180 tablet 3  . lisinopril (PRINIVIL,ZESTRIL) 40 MG tablet Take 1 tablet (40 mg total) by mouth daily. 90 tablet 3  . nitroGLYCERIN (NITROSTAT) 0.4 MG SL tablet Place 1 tablet (0.4 mg total) under the tongue every 5 (five) minutes x 3 doses as needed for chest pain. 25 tablet 3  . Omega-3 Fatty Acids (FISH OIL) 1000 MG CAPS Take 1 capsule (1,000 mg total) by mouth daily. 30  capsule 6  . hydrocortisone (ANUSOL-HC) 25 MG suppository Place 1 suppository (25 mg total) rectally 2 (two) times daily. 12 suppository 0   No current facility-administered medications for this visit.     Allergies-reviewed and updated Allergies  Allergen Reactions  . Crestor [Rosuvastatin Calcium]     Joint pain     Social History   Socioeconomic History  . Marital status: Married    Spouse name: Not on file  . Number of children: Not on file  . Years of education: Not on file  . Highest education level: Not on file  Occupational History  . Not on file  Social Needs  . Financial resource strain: Not on file  . Food insecurity:    Worry: Not on file    Inability: Not on file  . Transportation needs:    Medical: Not on file    Non-medical: Not on file  Tobacco Use  . Smoking status: Former Smoker    Last attempt to quit: 08/31/1973    Years since quitting: 45.2  . Smokeless tobacco: Never Used  Substance and Sexual Activity  . Alcohol use: No    Alcohol/week: 0.0 standard drinks  . Drug use: No  . Sexual activity: Not on file  Lifestyle  . Physical activity:    Days per week: Not on file    Minutes per session: Not on file  . Stress: Not on file  Relationships  . Social connections:    Talks on phone: Not on file    Gets together: Not on file    Attends religious service: Not on file    Active member of club or organization: Not on file    Attends meetings of clubs or organizations: Not on file    Relationship status: Not on file  Other Topics Concern  . Not on file  Social History Narrative  . Not on file    Objective:  Physical Exam: BP 120/74 (BP Location: Left Arm, Patient Position: Sitting, Cuff Size: Large)   Pulse 63   Temp 97.7 F (36.5 C) (Oral)   Ht 5\' 9"  (1.753 m)   Wt 243 lb 4 oz (110.3 kg)   SpO2 97%   BMI 35.92 kg/m   Gen: NAD, resting comfortably CV: RRR with no murmurs appreciated Pulm: NWOB, CTAB with no crackles, wheezes, or  rhonchi GI: Normal bowel sounds present. Soft, Nontender, Nondistended. GU: Very small excoriated hemorrhoid along anterior edge of anus. MSK: No edema, cyanosis, or clubbing noted -Right foot: Very subtle valgus deformity of first MTP joint.  MTP joint is mildly tender to palpation.  No pain with axial loading. Skin: Warm, dry Neuro: Grossly normal, moves all extremities Psych: Normal affect and thought content  Assessment/Plan:  Erectile dysfunction Patient's cardiologist told him to avoid Viagra and Cialis.  Recommended urology  referral to discuss other treatment options however patient declined.  HLD (hyperlipidemia) LDL 79 few weeks ago.  Continue Lipitor 20 mg daily.  Hemorrhoids Start topical hydrocortisone cream for his pruritus and burning.  Recommended importance of 1-3 soft bowel movements daily.  Recommended good oral hydration.  Diet high in fiber with supplementation as needed, and daily stool softener use.  Discussed reasons to return.  HTN (hypertension) Continue Coreg 25 twice daily and lisinopril 40 mg daily.  Pain of right great toe Likely secondary to hallux valgus deformity.  May have some underlying osteoarthritis as well.  Currently his symptoms are manageable and he declines further intervention at this point.  If symptoms become more problematic, would consider x-ray and/or referral to sports medicine orthopedics.  Preventative Healthcare Patient was instructed to return soon for CPE. Health Maintenance Due  Topic Date Due  . Hepatitis C Screening  1951-02-13  . COLONOSCOPY  07/30/2001   Katina Degree. [name], MD 12/02/2018 12:13 PM

## 2018-12-02 NOTE — Assessment & Plan Note (Signed)
Likely secondary to hallux valgus deformity.  May have some underlying osteoarthritis as well.  Currently his symptoms are manageable and he declines further intervention at this point.  If symptoms become more problematic, would consider x-ray and/or referral to sports medicine orthopedics.

## 2018-12-02 NOTE — Assessment & Plan Note (Signed)
Start topical hydrocortisone cream for his pruritus and burning.  Recommended importance of 1-3 soft bowel movements daily.  Recommended good oral hydration.  Diet high in fiber with supplementation as needed, and daily stool softener use.  Discussed reasons to return.

## 2018-12-03 ENCOUNTER — Telehealth: Payer: Self-pay | Admitting: Family Medicine

## 2018-12-03 MED ORDER — HYDROCORTISONE 2.5 % RE CREA: 1.0000 "application " | TOPICAL_CREAM | Freq: Two times a day (BID) | RECTAL | 0 refills | Status: DC

## 2018-12-03 NOTE — Telephone Encounter (Signed)
Sent in alternative cream.  Katalyna Socarras M. Jimmey RalphParker, MD 12/03/2018 11:21 AM

## 2018-12-03 NOTE — Telephone Encounter (Signed)
Please advise 

## 2018-12-03 NOTE — Addendum Note (Signed)
Addended by: Ardith DarkPARKER, Rajanee Schuelke M on: 12/03/2018 11:21 AM   Modules accepted: Orders

## 2018-12-03 NOTE — Telephone Encounter (Signed)
Pt called again to follow up on Hydrocortisone Acetate substitute that will be cheaper that what was originally prescribed. Please advise CB#[phone].   [id number] [place], [place] -[id number] [phone] (Phone) [phone] (Fax)

## 2018-12-03 NOTE — Telephone Encounter (Signed)
Copied from CRM [id number]. Topic: General - Other >> Dec 03, 2018  8:06 AM Gean BirchwoodWilliams-Neal, Sade R wrote: Patients wife called in and stated that the Hydrocortisone Acetate that was prescribed yesterday ist covered by the insurance and would like to know if there is something else that can be prescribed.  CB# [phone]

## 2018-12-11 ENCOUNTER — Telehealth: Payer: Self-pay | Admitting: Family Medicine

## 2018-12-11 MED ORDER — NITROGLYCERIN 0.4 % RE OINT: TOPICAL_OINTMENT | RECTAL | 0 refills | Status: DC

## 2018-12-11 MED ORDER — LIDOCAINE 5 % EX OINT: 1.0000 "application " | TOPICAL_OINTMENT | Freq: Four times a day (QID) | CUTANEOUS | 0 refills | Status: DC | PRN

## 2018-12-11 NOTE — Telephone Encounter (Signed)
Pt calling back stating that the hydrocortisone (PROCTOSOL HC) 2.5 % rectal cream isnt working for him and would like something stronger please call pt back at [phone]

## 2018-12-11 NOTE — Telephone Encounter (Signed)
See note  Copied from CRM [id number]. Topic: General - Other >> Dec 11, 2018 11:23 AM Marylen PontoMcneil, Ja-Kwan wrote: Reason for CRM: Pt called once again stating that he is in lots of pain and he would like something stronger than the hydrocortisone (PROCTOSOL HC) 2.5 % rectal cream. Pt requests call back.

## 2018-12-11 NOTE — Telephone Encounter (Signed)
Please advise 

## 2018-12-11 NOTE — Telephone Encounter (Signed)
Routed to Dr. Parker.

## 2018-12-11 NOTE — Telephone Encounter (Signed)
See note

## 2018-12-11 NOTE — Addendum Note (Signed)
Addended by: Ardith DarkPARKER, CALEB M on: 12/11/2018 12:12 PM   Modules accepted: Orders

## 2018-12-12 ENCOUNTER — Other Ambulatory Visit: Payer: Self-pay

## 2018-12-12 MED ORDER — LIDOCAINE 5 % EX OINT: 1.0000 "application " | TOPICAL_OINTMENT | Freq: Four times a day (QID) | CUTANEOUS | 0 refills | Status: DC | PRN

## 2018-12-12 NOTE — Telephone Encounter (Signed)
See note

## 2018-12-12 NOTE — Telephone Encounter (Signed)
Noted  

## 2018-12-12 NOTE — Telephone Encounter (Signed)
Patient called to say that he need Mario Torres, Khamika, CMA to give him a call regarding his Rx for lidocaine (XYLOCAINE) 5 % ointment he [facility] and [facility] because it is $22 there. Ph# [phone]

## 2018-12-12 NOTE — Telephone Encounter (Signed)
Spoke with patient questing regarding Rx. Issues resolved.

## 2018-12-12 NOTE — Telephone Encounter (Signed)
Notified patient,stated Rx was expensive recommended Good Rx for cheaper price.

## 2018-12-12 NOTE — Telephone Encounter (Signed)
Pt [facility] to [facility] with [facility] to conversation from yesterday.   CB: [phone]

## 2018-12-20 ENCOUNTER — Other Ambulatory Visit: Payer: Self-pay | Admitting: Family Medicine

## 2019-01-13 ENCOUNTER — Telehealth: Payer: Self-pay | Admitting: Family Medicine

## 2019-01-13 ENCOUNTER — Other Ambulatory Visit: Payer: Self-pay | Admitting: Family Medicine

## 2019-01-13 DIAGNOSIS — K649 Unspecified hemorrhoids: Secondary | ICD-10-CM

## 2019-01-13 NOTE — Telephone Encounter (Signed)
Ok with referral to surgery if pt wishes.  Mario Torres. [name], MD 01/13/2019 12:31 PM

## 2019-01-13 NOTE — Telephone Encounter (Signed)
See note, please include Judeth Cornfield if needed.   Copied from CRM [id number]. Topic: General - Other >> Jan 13, 2019  9:47 AM Gaynelle Adu wrote: Reason for CRM: patient is calling to state the Hemorrhoids issue Is still on going and would like to speak with a nurse in regards to a referral. Please advise

## 2019-01-13 NOTE — Telephone Encounter (Signed)
Referral placed, patient aware 

## 2019-01-13 NOTE — Telephone Encounter (Signed)
Please advise 

## 2019-01-21 ENCOUNTER — Ambulatory Visit: Payer: Self-pay | Admitting: Surgery

## 2019-03-04 ENCOUNTER — Telehealth: Payer: Self-pay | Admitting: Family Medicine

## 2019-03-04 NOTE — Telephone Encounter (Signed)
See note

## 2019-03-04 NOTE — Telephone Encounter (Signed)
Please advise 

## 2019-03-04 NOTE — Telephone Encounter (Signed)
Copied from CRM [id number]. Topic: Quick Communication - See Telephone Encounter >> Mar 04, 2019  8:25 AM Baldo Daub L wrote: CRM for notification. See Telephone encounter for: 03/04/19.  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 Pt can be reached at [phone]

## 2019-03-04 NOTE — Telephone Encounter (Signed)
Ok for letter stating patient is at high risk for complications from COVID19.  Mario Torres. [name], MD 03/04/2019 9:49 AM

## 2019-03-04 NOTE — Telephone Encounter (Signed)
Patient is requesting letter be faxed directly to his employer, if possible.   Fax# [phone] Attn: Tamela Oddi

## 2019-03-05 NOTE — Telephone Encounter (Signed)
Letter has been written and faxed

## 2019-03-26 ENCOUNTER — Other Ambulatory Visit: Payer: Self-pay | Admitting: Family Medicine

## 2019-04-10 ENCOUNTER — Ambulatory Visit (INDEPENDENT_AMBULATORY_CARE_PROVIDER_SITE_OTHER): Payer: Medicare Other | Admitting: Family Medicine

## 2019-04-10 ENCOUNTER — Encounter: Payer: Self-pay | Admitting: Family Medicine

## 2019-04-10 DIAGNOSIS — I10 Essential (primary) hypertension: Secondary | ICD-10-CM

## 2019-04-10 DIAGNOSIS — E785 Hyperlipidemia, unspecified: Secondary | ICD-10-CM | POA: Diagnosis not present

## 2019-04-10 DIAGNOSIS — K649 Unspecified hemorrhoids: Secondary | ICD-10-CM | POA: Diagnosis not present

## 2019-04-10 MED ORDER — HYDROCORTISONE 2.5 % RE CREA: 1.0000 "application " | TOPICAL_CREAM | Freq: Two times a day (BID) | RECTAL | 0 refills | Status: DC

## 2019-04-10 MED ORDER — HYDROCORTISONE ACETATE 25 MG RE SUPP: RECTAL | 1 refills | Status: DC

## 2019-04-10 NOTE — Assessment & Plan Note (Signed)
Stable.  Continue Coreg 25 mg twice daily and lisinopril 40 mg daily.

## 2019-04-10 NOTE — Assessment & Plan Note (Signed)
Stable.  Continue atorvastatin 10 mg daily.  Check lipid panel next blood draw.

## 2019-04-10 NOTE — Progress Notes (Signed)
    Chief Complaint:  Mario Torres is a 68 y.o. male who presents today for a virtual office visit with a chief complaint of hemorrhoids follow up.   Assessment/Plan:  HLD (hyperlipidemia) Stable.  Continue atorvastatin 10 mg daily.  Check lipid panel next blood draw.  Hemorrhoids Stable.  Continue hydrocortisone suppository and cream as needed.  Offered surgical referral however patient declined for the time being.  Encouraged good oral hydration and high-fiber diet.  Advised patient that if continues to have symptoms despite above, will need surgical referral.  He voiced understanding.  HTN (hypertension) Stable.  Continue Coreg 25 mg twice daily and lisinopril 40 mg daily.     Subjective:  HPI:  His stable, chronic medical conditions are outlined below:  # Essential Hypertension - On Coreg 25 mg twice daily and lisinopril 40 mg daily.  Tolerating well. - No reported chest pain or shortness of breath  # Dyslipidemia - On lipitor 20mg  daily and tolerating well - ROS: No reported myalgias  # Hemorrhoids - Uses hydrocortisone cream and suppositories as needed - Occasionally still has painful flares, though still manageable    ROS: Per HPI  PMH: He reports that he quit smoking about 45 years ago. He has never used smokeless tobacco. He reports that he does not drink alcohol or use drugs.      Objective/Observations  Physical Exam: Gen: NAD, resting comfortably Pulm: Normal work of breathing Neuro: Grossly normal, moves all extremities Psych: Normal affect and thought content  Virtual Visit via Video   I connected with Mario Torres on 04/10/19 at  1:20 PM EDT by a video enabled telemedicine application and verified that I am speaking with the correct person using two identifiers. I discussed the limitations of evaluation and management by telemedicine and the availability of in person appointments. The patient expressed understanding and agreed to proceed.    Patient location: Home Provider location: [place] [employer] Persons participating in the virtual visit: Myself and Patient     Katina Degree. [name], MD 04/10/2019 1:47 PM

## 2019-04-10 NOTE — Assessment & Plan Note (Addendum)
Stable.  Continue hydrocortisone suppository and cream as needed.  Offered surgical referral however patient declined for the time being.  Encouraged good oral hydration and high-fiber diet.  Advised patient that if continues to have symptoms despite above, will need surgical referral.  He voiced understanding.

## 2019-04-20 ENCOUNTER — Other Ambulatory Visit: Payer: Self-pay

## 2019-04-20 ENCOUNTER — Encounter: Payer: Self-pay | Admitting: Family Medicine

## 2019-04-20 ENCOUNTER — Telehealth: Payer: Self-pay

## 2019-04-20 DIAGNOSIS — K649 Unspecified hemorrhoids: Secondary | ICD-10-CM

## 2019-04-20 NOTE — Telephone Encounter (Signed)
Referral has been placed. 

## 2019-04-20 NOTE — Telephone Encounter (Signed)
Copied from CRM [id number]. Topic: Referral - Request for Referral >> Apr 20, 2019 10:06 AM Crist Infante wrote: Pt states he has spoken to Dr [name] concerning his Hemorrhoid issues and if the pain got unbearable, dr could refer to surgeon. Pt states he is ready for that referral.

## 2019-05-01 ENCOUNTER — Ambulatory Visit: Payer: Self-pay | Admitting: Surgery

## 2019-06-24 ENCOUNTER — Ambulatory Visit: Payer: Self-pay | Admitting: Surgery

## 2019-07-24 NOTE — Progress Notes (Addendum)
Chart reviewed with Dr. Tobias Alexander, ok to proceed with surgery as scheduled at day surgery center without any further cardiac work up. Pt denies SOB and chest pain when I spoke with him 07/24/2019. Will finish phone call when [place] from the beach on Monday 07/27/2019.  During full pre-procedural phone call on 07/27/2019, pt states he took one dose of nitroglycerin at home about a month ago due to some chest tightness. Chest pain went away after a half an hour, pt states it might have been related to indigestion or eating greasy food earlier that day. Reviewed with Dr. Marcie Bal. Ok to proceed as scheduled.

## 2019-07-27 ENCOUNTER — Other Ambulatory Visit: Payer: Self-pay

## 2019-07-27 ENCOUNTER — Encounter (HOSPITAL_BASED_OUTPATIENT_CLINIC_OR_DEPARTMENT_OTHER): Payer: Self-pay

## 2019-07-28 ENCOUNTER — Other Ambulatory Visit (HOSPITAL_COMMUNITY)
Admission: RE | Admit: 2019-07-28 | Discharge: 2019-07-28 | Disposition: A | Discharge status: Home / Self Care | Payer: Medicare Other | Source: Ambulatory Visit | Attending: Surgery | Admitting: Surgery

## 2019-07-28 DIAGNOSIS — Z20828 Contact with and (suspected) exposure to other viral communicable diseases: Secondary | ICD-10-CM | POA: Insufficient documentation

## 2019-07-28 DIAGNOSIS — Z01812 Encounter for preprocedural laboratory examination: Secondary | ICD-10-CM | POA: Diagnosis present

## 2019-07-28 LAB — SARS CORONAVIRUS 2 (TAT 6-24 HRS): SARS Coronavirus 2: NEGATIVE

## 2019-07-30 NOTE — Progress Notes (Signed)
Dr Gala Lewandowsky RN called to verify rectal prep order needs. States pt. To complete fleets enema prior to arrival in pre-op DOS. Called and spoke with pt. To verify pt. Aware of. Pt verbalized understanding.

## 2019-07-31 ENCOUNTER — Encounter (HOSPITAL_BASED_OUTPATIENT_CLINIC_OR_DEPARTMENT_OTHER): Payer: Self-pay | Admitting: Surgery

## 2019-07-31 ENCOUNTER — Ambulatory Visit (HOSPITAL_BASED_OUTPATIENT_CLINIC_OR_DEPARTMENT_OTHER): Payer: Medicare Other | Admitting: Certified Registered"

## 2019-07-31 ENCOUNTER — Other Ambulatory Visit: Payer: Self-pay

## 2019-07-31 ENCOUNTER — Encounter (HOSPITAL_BASED_OUTPATIENT_CLINIC_OR_DEPARTMENT_OTHER)
Admission: RE | Disposition: A | Discharge status: Home / Self Care | Payer: Self-pay | Source: Home / Self Care | Attending: Surgery

## 2019-07-31 ENCOUNTER — Ambulatory Visit (HOSPITAL_BASED_OUTPATIENT_CLINIC_OR_DEPARTMENT_OTHER)
Admission: RE | Admit: 2019-07-31 | Discharge: 2019-07-31 | Disposition: A | Discharge status: Home / Self Care | Payer: Medicare Other | Attending: Surgery | Admitting: Surgery

## 2019-07-31 DIAGNOSIS — I2510 Atherosclerotic heart disease of native coronary artery without angina pectoris: Secondary | ICD-10-CM | POA: Diagnosis not present

## 2019-07-31 DIAGNOSIS — Z87891 Personal history of nicotine dependence: Secondary | ICD-10-CM | POA: Diagnosis not present

## 2019-07-31 DIAGNOSIS — Z79899 Other long term (current) drug therapy: Secondary | ICD-10-CM | POA: Diagnosis not present

## 2019-07-31 DIAGNOSIS — K602 Anal fissure, unspecified: Secondary | ICD-10-CM | POA: Insufficient documentation

## 2019-07-31 DIAGNOSIS — K644 Residual hemorrhoidal skin tags: Secondary | ICD-10-CM | POA: Diagnosis not present

## 2019-07-31 DIAGNOSIS — I252 Old myocardial infarction: Secondary | ICD-10-CM | POA: Insufficient documentation

## 2019-07-31 DIAGNOSIS — I10 Essential (primary) hypertension: Secondary | ICD-10-CM | POA: Insufficient documentation

## 2019-07-31 DIAGNOSIS — Z955 Presence of coronary angioplasty implant and graft: Secondary | ICD-10-CM | POA: Diagnosis not present

## 2019-07-31 DIAGNOSIS — K641 Second degree hemorrhoids: Secondary | ICD-10-CM | POA: Insufficient documentation

## 2019-07-31 DIAGNOSIS — K649 Unspecified hemorrhoids: Secondary | ICD-10-CM | POA: Diagnosis present

## 2019-07-31 HISTORY — PX: EVALUATION UNDER ANESTHESIA WITH HEMORRHOIDECTOMY: SHX5624

## 2019-07-31 HISTORY — PX: SPHINCTEROTOMY: SHX5279

## 2019-07-31 HISTORY — PX: FISSURECTOMY: SHX5244

## 2019-07-31 SURGERY — FISSURECTOMY, ANAL
Anesthesia: General | Site: Rectum

## 2019-07-31 MED ORDER — LACTATED RINGERS IV SOLN
INTRAVENOUS | Status: DC
  Administered 2019-07-31: 15:00:00 via INTRAVENOUS

## 2019-07-31 MED ORDER — HYDROMORPHONE HCL 1 MG/ML IJ SOLN
INTRAMUSCULAR | Status: AC
  Filled 2019-07-31: qty 0.5

## 2019-07-31 MED ORDER — FENTANYL CITRATE (PF) 100 MCG/2ML IJ SOLN
INTRAMUSCULAR | Status: DC | PRN
  Administered 2019-07-31: 100 ug via INTRAVENOUS
  Administered 2019-07-31: 25 ug via INTRAVENOUS

## 2019-07-31 MED ORDER — SODIUM CHLORIDE 0.9 % IV SOLN
1.0000 g | INTRAVENOUS | Status: AC
  Administered 2019-07-31: 1 g via INTRAVENOUS

## 2019-07-31 MED ORDER — ERTAPENEM SODIUM 1 G IJ SOLR
INTRAMUSCULAR | Status: AC
  Filled 2019-07-31: qty 1

## 2019-07-31 MED ORDER — PROPOFOL 500 MG/50ML IV EMUL
INTRAVENOUS | Status: DC | PRN
  Administered 2019-07-31: 25 ug/kg/min via INTRAVENOUS

## 2019-07-31 MED ORDER — OXYCODONE HCL 5 MG PO TABS: 5.0000 mg | ORAL_TABLET | Freq: Four times a day (QID) | ORAL | 0 refills | Status: DC | PRN

## 2019-07-31 MED ORDER — CHLORHEXIDINE GLUCONATE CLOTH 2 % EX PADS: 6.0000 | MEDICATED_PAD | Freq: Once | CUTANEOUS | Status: DC

## 2019-07-31 MED ORDER — HYDROMORPHONE HCL 1 MG/ML IJ SOLN
0.2500 mg | INTRAMUSCULAR | Status: DC | PRN
  Administered 2019-07-31: 0.5 mg via INTRAVENOUS

## 2019-07-31 MED ORDER — FENTANYL CITRATE (PF) 100 MCG/2ML IJ SOLN
INTRAMUSCULAR | Status: AC
  Filled 2019-07-31: qty 2

## 2019-07-31 MED ORDER — BUPIVACAINE LIPOSOME 1.3 % IJ SUSP
INTRAMUSCULAR | Status: DC | PRN
  Administered 2019-07-31: 20 mL

## 2019-07-31 MED ORDER — LIDOCAINE HCL (CARDIAC) PF 100 MG/5ML IV SOSY
PREFILLED_SYRINGE | INTRAVENOUS | Status: DC | PRN
  Administered 2019-07-31: 60 mg via INTRAVENOUS

## 2019-07-31 MED ORDER — ACETAMINOPHEN 500 MG PO TABS: 1000.0000 mg | ORAL_TABLET | Freq: Once | ORAL | Status: DC

## 2019-07-31 MED ORDER — ONDANSETRON HCL 4 MG/2ML IJ SOLN
INTRAMUSCULAR | Status: DC | PRN
  Administered 2019-07-31: 4 mg via INTRAVENOUS

## 2019-07-31 MED ORDER — DEXAMETHASONE SODIUM PHOSPHATE 4 MG/ML IJ SOLN
INTRAMUSCULAR | Status: DC | PRN
  Administered 2019-07-31: 10 mg via INTRAVENOUS

## 2019-07-31 MED ORDER — PROPOFOL 10 MG/ML IV BOLUS
INTRAVENOUS | Status: DC | PRN
  Administered 2019-07-31: 150 mg via INTRAVENOUS

## 2019-07-31 SURGICAL SUPPLY — 31 items
BLADE SURG 15 STRL LF DISP TIS (BLADE) ×1 IMPLANT
BLADE SURG 15 STRL SS (BLADE) ×2
CANISTER SUCT 1200ML W/VALVE (MISCELLANEOUS) ×2 IMPLANT
COVER MAYO STAND REUSABLE (DRAPES) IMPLANT
COVER WAND RF STERILE (DRAPES) IMPLANT
DECANTER SPIKE VIAL GLASS SM (MISCELLANEOUS) ×2 IMPLANT
DRAPE UTILITY XL STRL (DRAPES) ×2 IMPLANT
DRSG PAD ABDOMINAL 8X10 ST (GAUZE/BANDAGES/DRESSINGS) IMPLANT
ELECT REM PT RETURN 9FT ADLT (ELECTROSURGICAL) ×2
ELECTRODE REM PT RTRN 9FT ADLT (ELECTROSURGICAL) ×1 IMPLANT
GAUZE SPONGE 4X4 12PLY STRL LF (GAUZE/BANDAGES/DRESSINGS) ×2 IMPLANT
GLOVE SURG ORTHO 8.0 STRL STRW (GLOVE) ×2 IMPLANT
GOWN STRL REUS W/ TWL LRG LVL3 (GOWN DISPOSABLE) ×1 IMPLANT
GOWN STRL REUS W/ TWL XL LVL3 (GOWN DISPOSABLE) ×1 IMPLANT
GOWN STRL REUS W/TWL LRG LVL3 (GOWN DISPOSABLE) ×2
GOWN STRL REUS W/TWL XL LVL3 (GOWN DISPOSABLE) ×2
NDL HYPO 25X1 1.5 SAFETY (NEEDLE) ×1 IMPLANT
NEEDLE HYPO 25X1 1.5 SAFETY (NEEDLE) ×2 IMPLANT
PACK BASIN DAY SURGERY FS (CUSTOM PROCEDURE TRAY) ×2 IMPLANT
PACK LITHOTOMY IV (CUSTOM PROCEDURE TRAY) IMPLANT
PENCIL BUTTON HOLSTER BLD 10FT (ELECTRODE) ×2 IMPLANT
SPONGE SURGIFOAM ABS GEL 12-7 (HEMOSTASIS) ×1 IMPLANT
SURGILUBE 2OZ TUBE FLIPTOP (MISCELLANEOUS) ×8 IMPLANT
SUT CHROMIC 3 0 SH 27 (SUTURE) ×2 IMPLANT
SYR CONTROL 10ML LL (SYRINGE) ×2 IMPLANT
TOWEL GREEN STERILE FF (TOWEL DISPOSABLE) ×4 IMPLANT
TRAY DSU PREP LF (CUSTOM PROCEDURE TRAY) ×2 IMPLANT
TRAY PROCTOSCOPIC FIBER OPTIC (SET/KITS/TRAYS/PACK) IMPLANT
TUBE CONNECTING 20X1/4 (TUBING) ×2 IMPLANT
UNDERPAD 30X30 (UNDERPADS AND DIAPERS) ×2 IMPLANT
YANKAUER SUCT BULB TIP NO VENT (SUCTIONS) ×2 IMPLANT

## 2019-07-31 NOTE — Anesthesia Postprocedure Evaluation (Signed)
Anesthesia Post Note  Patient: Mario Torres  Procedure(s) Performed: FISSURECTOMY (N/A Rectum) EXAM UNDER ANESTHESIA WITH RUBBER BAND LIGATION OF INTERNAL HEMORRHOIDS (N/A Rectum) LATERAL INTERNAL SPHINCTEROTOMY (N/A Rectum)     Patient location during evaluation: PACU Anesthesia Type: General Level of consciousness: awake and alert Pain management: pain level controlled Vital Signs Assessment: post-procedure vital signs reviewed and stable Respiratory status: spontaneous breathing, nonlabored ventilation and respiratory function stable Cardiovascular status: blood pressure returned to baseline and stable Postop Assessment: no apparent nausea or vomiting Anesthetic complications: no    Last Vitals:  Vitals:   07/31/19 1600 07/31/19 1615  BP: (!) 143/100 (!) 153/88  Pulse: 74 91  Resp: 16 16  Temp:  36.7 C  SpO2: 94% 95%    Last Pain:  Vitals:   07/31/19 1615  TempSrc:   PainSc: 2                  Kosta Schnitzler,W. EDMOND

## 2019-07-31 NOTE — Transfer of Care (Signed)
Immediate Anesthesia Transfer of Care Note  Patient: Mario Torres  Procedure(s) Performed: FISSURECTOMY (N/A Rectum) EXAM UNDER ANESTHESIA WITH RUBBER BAND LIGATION OF INTERNAL HEMORRHOIDS (N/A Rectum) LATERAL INTERNAL SPHINCTEROTOMY (N/A Rectum)  Patient Location: PACU  Anesthesia Type:General  Level of Consciousness: drowsy and patient cooperative  Airway & Oxygen Therapy: Patient Spontanous Breathing and Patient connected to face mask oxygen  Post-op Assessment: Report given to RN and Post -op Vital signs reviewed and stable  Post vital signs: Reviewed and stable  Last Vitals:  Vitals Value Taken Time  BP 109/68 07/31/19 1533  Temp    Pulse 58 07/31/19 1534  Resp 19 07/31/19 1534  SpO2 99 % 07/31/19 1534  Vitals shown include unvalidated device data.  Last Pain:  Vitals:   07/31/19 1336  TempSrc: Oral  PainSc: 4       Patients Stated Pain Goal: 3 (34/35/68 6168)  Complications: No apparent anesthesia complications

## 2019-07-31 NOTE — Anesthesia Preprocedure Evaluation (Addendum)
Anesthesia Evaluation  Patient identified by MRN, date of birth, ID band Patient awake    Reviewed: Allergy & Precautions, H&P , NPO status , Patient's Chart, lab work & pertinent test results, reviewed documented beta blocker date and time   Airway Mallampati: III  TM Distance: >3 FB Neck ROM: Full    Dental no notable dental hx. (+) Edentulous Upper, Edentulous Lower, Dental Advisory Given   Pulmonary neg pulmonary ROS, former smoker,    Pulmonary exam normal breath sounds clear to auscultation       Cardiovascular hypertension, Pt. on medications and Pt. on home beta blockers + CAD, + Past MI and + Cardiac Stents   Rhythm:Regular Rate:Normal     Neuro/Psych negative neurological ROS  negative psych ROS   GI/Hepatic Neg liver ROS, GERD  Controlled,  Endo/Other  negative endocrine ROS  Renal/GU negative Renal ROS  negative genitourinary   Musculoskeletal   Abdominal   Peds  Hematology negative hematology ROS (+)   Anesthesia Other Findings   Reproductive/Obstetrics negative OB ROS                            Anesthesia Physical Anesthesia Plan  ASA: III  Anesthesia Plan: General   Post-op Pain Management:    Induction: Intravenous  PONV Risk Score and Plan: 3 and Ondansetron, Dexamethasone and Midazolam  Airway Management Planned: Oral ETT and LMA  Additional Equipment:   Intra-op Plan:   Post-operative Plan: Extubation in OR  Informed Consent: I have reviewed the patients History and Physical, chart, labs and discussed the procedure including the risks, benefits and alternatives for the proposed anesthesia with the patient or authorized representative who has indicated his/her understanding and acceptance.     Dental advisory given  Plan Discussed with: CRNA  Anesthesia Plan Comments:         Anesthesia Quick Evaluation

## 2019-07-31 NOTE — Discharge Instructions (Signed)
  Post Anesthesia Home Care Instructions  Activity: Get plenty of rest for the remainder of the day. A responsible individual must stay with you for 24 hours following the procedure.  For the next 24 hours, DO NOT: -Drive a car -Operate machinery -Drink alcoholic beverages -Take any medication unless instructed by your physician -Make any legal decisions or sign important papers.  Meals: Start with liquid foods such as gelatin or soup. Progress to regular foods as tolerated. Avoid greasy, spicy, heavy foods. If nausea and/or vomiting occur, drink only clear liquids until the nausea and/or vomiting subsides. Call your physician if vomiting continues.  Special Instructions/Symptoms: Your throat may feel dry or sore from the anesthesia or the breathing tube placed in your throat during surgery. If this causes discomfort, gargle with warm salt water. The discomfort should disappear within 24 hours.  If you had a scopolamine patch placed behind your ear for the management of post- operative nausea and/or vomiting:  1. The medication in the patch is effective for 72 hours, after which it should be removed.  Wrap patch in a tissue and discard in the trash. Wash hands thoroughly with soap and water. 2. You may remove the patch earlier than 72 hours if you experience unpleasant side effects which may include dry mouth, dizziness or visual disturbances. 3. Avoid touching the patch. Wash your hands with soap and water after contact with the patch.    Information for Discharge Teaching: EXPAREL (bupivacaine liposome injectable suspension)   Your surgeon or anesthesiologist gave you EXPAREL(bupivacaine) to help control your pain after surgery.  EXPAREL is a local anesthetic that provides pain relief by numbing the tissue around the surgical site. EXPAREL is designed to release pain medication over time and can control pain for up to 72 hours. Depending on how you respond to EXPAREL, you may require  less pain medication during your recovery.  Possible side effects: Temporary loss of sensation or ability to move in the area where bupivacaine was injected. Nausea, vomiting, constipation Rarely, numbness and tingling in your mouth or lips, lightheadedness, or anxiety may occur. Call your doctor right away if you think you may be experiencing any of these sensations, or if you have other questions regarding possible side effects.  Follow all other discharge instructions given to you by your surgeon or nurse. Eat a healthy diet and drink plenty of water or other fluids.  If you return to the hospital for any reason within 96 hours following the administration of EXPAREL, it is important for health care providers to know that you have received this anesthetic. A teal colored band has been placed on your arm with the date, time and amount of EXPAREL you have received in order to alert and inform your health care providers. Please leave this armband in place for the full 96 hours following administration, and then you may remove the band.  

## 2019-07-31 NOTE — H&P (Signed)
General Surgery [facility]  ANTWIONE PICOTTE DOB: [birth year] Married / Language: English / Race: White Male   History of Present Illness  The patient is a 68 year old male who presents with an anal fissure.  CHIEF COMPLAINT: anal fissure, hemorrhoids  Patient returns for follow-up of anal fissure. Despite 2 months of medical management, he remains symptomatic. He is still experiencing pain with onset of bowel movements and he is seeing some bleeding on the toilet tissue. He is able to palpate the hemorrhoidal skin tags on the external surface of the anus. He presents today to discuss further management and possible surgery. Patient has been using topical Cardizem cream. He has not seen significant improvement.   Problem List/Past Medical  GRADE II INTERNAL HEMORRHOIDS (K64.1)  ANAL FISSURE (K60.2)   Past Surgical History Carotid Artery Surgery  Bilateral. Oral Surgery  Vasectomy   Diagnostic Studies History Colonoscopy  >10 years ago  Allergies  No Known Allergies   Allergies Reconciled   Medication History Atorvastatin Calcium (20MG  Tablet, Oral) Active. Carvedilol (25MG  Tablet, Oral) Active. Hydrocortisone (2.5% Cream, Rectal) Active. Lisinopril (40MG  Tablet, Oral) Active. Nitroglycerin (0.4MG  Tab Sublingual, Sublingual) Active. Medications Reconciled  Social History Caffeine use  Coffee. No alcohol use  No drug use  Tobacco use  Former smoker.  Family History  Alcohol Abuse  Father, Mother. Colon Cancer  Father. Heart Disease  Father. Hypertension  Father. Respiratory Condition  Father.  Other Problems High blood pressure   Vitals Weight: 242.4 lb Height: 70in Body Surface Area: 2.27 m Body Mass Index: 34.78 kg/m  Temp.: 98.67F  Pulse: 69 (Regular)  BP: 126/82(Sitting, Left Arm, Standard)  Physical Exam  See vital signs recorded above  GENERAL APPEARANCE Development:  normal Nutritional status: normal Gross deformities: none  SKIN Rash, lesions, ulcers: none Induration, erythema: none Nodules: none palpable  EYES Conjunctiva and lids: normal Pupils: equal and reactive Iris: normal bilaterally  EARS, NOSE, MOUTH, THROAT External ears: no lesion or deformity External nose: no lesion or deformity Hearing: grossly normal  NECK Symmetric: yes Trachea: midline Thyroid: no palpable nodules in the thyroid bed  CHEST Respiratory effort: normal Retraction or accessory muscle use: no Breath sounds: normal bilaterally Rales, rhonchi, wheeze: none  CARDIOVASCULAR Auscultation: regular rhythm, normal rate Murmurs: none Pulses: carotid and radial pulse 2+ palpable Lower extremity edema: none Lower extremity varicosities: none  RECTAL External examination shows multiple external skin tags. There is a sentinel tag in the posterior midline just to the left of center. With eversion there is a relatively deep posterior anal fissure with obvious granulation tissue and exquisite tenderness to palpation.  MUSCULOSKELETAL Station and gait: normal Digits and nails: no clubbing or cyanosis Muscle strength: grossly normal all extremities Range of motion: grossly normal all extremities Deformity: none  LYMPHATIC Cervical: none palpable Supraclavicular: none palpable  PSYCHIATRIC Oriented to person, place, and time: yes Mood and affect: normal for situation Judgment and insight: appropriate for situation    Assessment & Plan  ANAL FISSURE (K60.2)  GRADE II INTERNAL HEMORRHOIDS (K64.1)   Patient returns today to discuss options for further management. At this point the patient has failed medical management with stool softeners and topical creams. He would like to proceed with surgery.  We discussed going to the operating room for examination under anesthesia. We would plan to do a fissurectomy and lateral internal sphincterotomy. I may  also remove the hemorrhoidal skin tags and possibly do a hemorrhoidal banding at the same  time. We discussed his postoperative recovery. He understands and wishes to proceed with surgery in the near future.  The risks and benefits of the procedure have been discussed at length with the patient. The patient understands the proposed procedure, potential alternative treatments, and the course of recovery to be expected. All of the patient's questions have been answered at this time. The patient wishes to proceed with surgery.   Darnell Levelodd Gustavia Carie, MD [facility] Office: [phone]

## 2019-07-31 NOTE — Op Note (Signed)
Operative Note  Pre-operative Diagnosis:  Anal fissure, internal hemorrhoids, anal skin tags  Post-operative Diagnosis:  same  Surgeon:  Armandina Gemma, MD  Assistant:  none   Procedure:  1. fissurectomy  2. Lateral internal sphincterotomy  3. Rubber band ligation internal hemorrhoid  4. Excision of anal skin tag  Anesthesia:  general  Estimated Blood Loss:  10cc  Drains: none         Specimen: none  Indications:  Patient returns for follow-up of anal fissure. Despite 2 months of medical management, he remains symptomatic. He is still experiencing pain with onset of bowel movements and he is seeing some bleeding on the toilet tissue. He is able to palpate the hemorrhoidal skin tags on the external surface of the anus. He presents today to discuss further management and possible surgery. Patient has been using topical Cardizem cream. He has not seen significant improvement.  Procedure Details:  The patient was seen in the pre-op holding area. The risks, benefits, complications, treatment options, and expected outcomes were previously discussed with the patient. The patient agreed with the proposed plan and has signed the informed consent form.  The patient was brought to the operating room by the surgical team, identified as Mario Torres and the procedure verified. A "time out" was completed and the above information confirmed.  Following administration of general anesthesia, the patient is placed in lithotomy and then prepped and draped in the usual aseptic fashion.  After ascertaining that an adequate level of anesthesia been achieved, digital rectal examination is performed.  Anal speculum was then inserted into the anal canal and a 360 degree inspection is performed.  There is a deep fissure in the posterior midline with exposed sphincter muscle at the base.  Tissue in the fissure is quite friable.  There are grade 2 internal hemorrhoids particularly in the anterior columns.  There  are benign anal skin tags.  The fissure is debrided gently and then cauterized with the electrocautery taking care to avoid the underlying sphincter musculature.  Good hemostasis is noted.  All granulation tissue was cauterized.  In the left lateral position an incision is made with a #15 blade.  Using a mosquito hemostat, the internal fibers of the sphincter muscle are isolated on the tip of the hemostat and divided with the electrocautery.  Incision is closed with a 3-0 Chromic Gut figure-of-eight suture.  External anal skin tags are small and are easily excised with the electrocautery.  They do not require suturing.  Anal speculum was reintroduced into the anal canal.  The anterior hemorrhoidal columns are identified and the most prominent column is selected for rubber band ligation.  Using a forceps, the hemorrhoid well above the level of the dentate line is grasped and a rubber band is positioned properly.  There is no bleeding.  Surgical site is anesthetized with Exparel local anesthetic.  Good hemostasis is noted throughout.  Gelfoam is moistened with lubricant and inserted into the anal canal and left in position adjacent to the fissurectomy.  Patient is taken out of lithotomy.  Dry ABD dressing is placed externally.  Patient is awakened from anesthesia and brought to the recovery room.  The patient tolerated the procedure well.   Armandina Gemma, MD [facility] Office: [phone]

## 2019-07-31 NOTE — Interval H&P Note (Signed)
History and Physical Interval Note:  07/31/2019 2:22 PM  Mario Torres  has presented today for surgery, with the diagnosis of ANAL FISSURE, INTERNAL HEMORRHOIDS.  The various methods of treatment have been discussed with the patient and family. After consideration of risks, benefits and other options for treatment, the patient has consented to    Procedure(s): FISSURECTOMY (N/A) EXAM UNDER ANESTHESIA WITH RUBBER BAND LIGATION OF INTERNAL HEMORRHOIDS (N/A) LATERAL INTERNAL SPHINCTEROTOMY (N/A) as a surgical intervention.    The patient's history has been reviewed, patient examined, no change in status, stable for surgery.  I have reviewed the patient's chart and labs.  Questions were answered to the patient's satisfaction.    Armandina Gemma, [place] [facility] Office: [place]

## 2019-07-31 NOTE — Anesthesia Procedure Notes (Signed)
Procedure Name: LMA Insertion Date/Time: 07/31/2019 2:40 PM Performed by: Signe Colt, CRNA Pre-anesthesia Checklist: Patient identified, Emergency Drugs available, Suction available and Patient being monitored Patient Re-evaluated:Patient Re-evaluated prior to induction Oxygen Delivery Method: Circle system utilized Preoxygenation: Pre-oxygenation with 100% oxygen Induction Type: IV induction Ventilation: Mask ventilation without difficulty LMA: LMA inserted LMA Size: 4.0 Number of attempts: 1 Airway Equipment and Method: Bite block Placement Confirmation: positive ETCO2 Tube secured with: Tape Dental Injury: Teeth and Oropharynx as per pre-operative assessment

## 2019-08-03 ENCOUNTER — Encounter (HOSPITAL_BASED_OUTPATIENT_CLINIC_OR_DEPARTMENT_OTHER): Payer: Self-pay | Admitting: Surgery

## 2019-09-08 ENCOUNTER — Encounter: Payer: Self-pay | Admitting: Cardiovascular Disease

## 2019-09-08 ENCOUNTER — Other Ambulatory Visit: Payer: Self-pay

## 2019-09-08 ENCOUNTER — Ambulatory Visit: Payer: Medicare Other | Admitting: Cardiovascular Disease

## 2019-09-08 DIAGNOSIS — I519 Heart disease, unspecified: Secondary | ICD-10-CM | POA: Diagnosis not present

## 2019-09-08 DIAGNOSIS — Z79899 Other long term (current) drug therapy: Secondary | ICD-10-CM

## 2019-09-08 DIAGNOSIS — I10 Essential (primary) hypertension: Secondary | ICD-10-CM | POA: Diagnosis not present

## 2019-09-08 DIAGNOSIS — I5189 Other ill-defined heart diseases: Secondary | ICD-10-CM

## 2019-09-08 DIAGNOSIS — E6609 Other obesity due to excess calories: Secondary | ICD-10-CM

## 2019-09-08 DIAGNOSIS — E782 Mixed hyperlipidemia: Secondary | ICD-10-CM

## 2019-09-08 DIAGNOSIS — I2510 Atherosclerotic heart disease of native coronary artery without angina pectoris: Secondary | ICD-10-CM | POA: Diagnosis not present

## 2019-09-08 DIAGNOSIS — I517 Cardiomegaly: Secondary | ICD-10-CM

## 2019-09-08 DIAGNOSIS — E66811 Obesity, class 1: Secondary | ICD-10-CM

## 2019-09-08 DIAGNOSIS — Z6834 Body mass index (BMI) 34.0-34.9, adult: Secondary | ICD-10-CM

## 2019-09-08 NOTE — Progress Notes (Signed)
Patient ID: Mario Torres, male   DOB: 09/01/1951, 68 y.o.   MRN: [id number]     HPI: Mario Torres is a 68 y.o. male who presents to the office today for a 22 month follow-up cardiology evaluation with me.   [name]  suffered an acute coronary syndrome in February 2012.  Emergent catheterization showed 95% proximal circumflex stenosis and 90% proximal RCA stenosis.  He was found to have 60% narrowing in the OM1 branch of the circumflex as well as 40% LAD stenosis.  He underwent acute intervention to his circumflex vessel with insertion of a Promus stent and 3 days later underwent staged intervention to his high-grade subtotal proximal RCA stenosis.  He subsequent Myoview study in March 2013 showed normal perfusion without scar or ischemia.  He was hospitalized in August 2014 with recurrent chest pain.  Repeat catheterization revealed widely patent previously placed stents.  Ejection fraction was 65%.  Subsequently, he was seen by Mario Torres in June 2015 and after having documented significant hypertension.  He was started back on medical therapy. He was seen by Mario Torres July 2015 who resumed Bystolic 5 mg and recommended reinstitution of lisinopril.  The patient has not seen a physician since.  He works part-time as a Recruitment consultant.    I had not seen him in over 2 years but saw him in January 2017, and in follow-up 2 months later in March 2017.  At that time, he has essentially had stopped taking all his medications.  His resting pulse was almost tachycardic and I instituted carvedilol initially at 3.125 mg twice a day.  I also recommended initiation of aspirin.  A Doppler study done on 12/30/2015 showed an EF of 55-60%.  There was evidence for severe LVH and grade 2 diastolic dysfunction.  Left atrium was severely dilated.  There was mild pulmonary hypertension with a PA pressure 31 mm.  A Lexiscan study revealed normal perfusion without evidence for scar or ischemia.  Ejection fraction was 55%.  I last  saw him in November 2018 at which time he felt as if he was remaining stable since his prior evaluation in March 2017.  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  At that time his blood pressure was elevated and his medications were further adjusted and carvedilol was increased to 25 mgtwice a day, and  lisinopril 40 mg daily.  He underwent an echo Doppler study in June 2019 which showed an EF of 60 to 5 to 70%, moderate LVH, grade 2 diastolic dysfunction, and mild MR.  He was last seen in the office by Mario Deforest, PA on November 05, 2018.  Laboratory revealed normal renal function glucose was mildly elevated at 111.  Lipid studies reveal total cholesterol 170, triglycerides 258, HDL 39, VLDL 52, and LDL 79.  Presently, he believes he feels well.  He denies chest pain.  He is exercising 3 times per week.  He has not been very successful with weight loss.  He has not had recent laboratory since November 2019.  He presents for evaluation.  Past Medical History:  Diagnosis Date  . Hyperlipidemia   . Hypertension   . MI (myocardial infarction) ([place])    2012    Past Surgical History:  Procedure Laterality Date  . CARDIAC CATHETERIZATION  02/06/2011   normal LV; CAD 40% mid left  LADafter thsecond diagonal;90-95% culprit stenosis prox.circ ;80-90% stenosis prox RCA.  Mario Torres CARDIAC CATHETERIZATION  11/24 /2003   normal cors  . CORONARY ANGIOPLASTY  02/06/2011   large dominant left circ sytem treated w/cutting balloon arthrotomy,stenting with 3.5 x 16 mm eerolimus Promus stent,postdilated to 4.0 mm  . CORONARY STENT PLACEMENT    . DOPPLER ECHOCARDIOGRAPHY  02/07/2011   EF 50 to 55%  mild -mod posterior wall hypokinesis,pulm vein flow pattern normal    . EVALUATION UNDER ANESTHESIA WITH HEMORRHOIDECTOMY N/A 07/31/2019   Procedure: EXAM UNDER ANESTHESIA WITH RUBBER BAND LIGATION OF INTERNAL  HEMORRHOIDS;  Surgeon: Mario Gemma, MD;  Location: [facility];  Service: General;  Laterality: N/A;  . FISSURECTOMY N/A 07/31/2019   Procedure: FISSURECTOMY;  Surgeon: Mario Gemma, MD;  Location: [place];  Service: General;  Laterality: N/A;  . KNEE SURGERY    . LEFT HEART CATHETERIZATION WITH CORONARY ANGIOGRAM N/A 08/10/2013   Procedure: LEFT HEART CATHETERIZATION WITH CORONARY ANGIOGRAM;  Surgeon: Mario Sine, MD;  Location: [facility] C[facility];  Service: Cardiovascular;  Laterality: N/A;  . NM MYOCAR PERF WALL MOTION  02/19/2012   EF 61% ,NO SIGNIFICANT WALL MOTION ABNORMALITIES NOTED  . SPHINCTEROTOMY N/A 07/31/2019   Procedure: LATERAL INTERNAL SPHINCTEROTOMY;  Surgeon: Mario Gemma, MD;  Location: [place];  Service: General;  Laterality: N/A;  . VASECTOMY      Allergies  Allergen Reactions  . Crestor [Rosuvastatin Calcium] Other (See Comments)    Joint pain, ankle pain     Current Outpatient Medications  Medication Sig Dispense Refill  . aspirin EC 81 MG tablet Take 81 mg by mouth daily.    Mario Torres atorvastatin (LIPITOR) 20 MG tablet Take 1 tablet (20 mg total) by mouth daily. 90 tablet 3  . carvedilol (COREG) 25 MG tablet Take 1 tablet (25 mg total) by mouth 2 (two) times daily with a meal. 180 tablet 3  . hydrocortisone (ANUSOL-HC) 2.5 % rectal cream Place 1 application rectally 2 (two) times daily. 30 g 0  . lidocaine (XYLOCAINE) 5 % ointment Apply 1 application topically 4 (four) times daily as needed. 35.44 g 0  . lisinopril (PRINIVIL,ZESTRIL) 40 MG tablet Take 1 tablet (40 mg total) by mouth daily. 90 tablet 3  . nitroGLYCERIN (NITROSTAT) 0.4 MG SL tablet Place 1 tablet (0.4 mg total) under the tongue every 5 (five) minutes x 3 doses as needed for chest pain. 25 tablet 3  . Omega-3 Fatty Acids (FISH OIL) 1000 MG CAPS Take 1 capsule (1,000 mg total) by mouth daily. 30 capsule 6  . oxyCODONE (OXY IR/ROXICODONE) 5 MG immediate release  tablet Take 1-2 tablets (5-10 mg total) by mouth every 6 (six) hours as needed for moderate pain, severe pain or breakthrough pain. 30 tablet 0   No current facility-administered medications for this visit.     Social History   Socioeconomic History  . Marital status: Married    Spouse name: Not on file  . Number of children: Not on file  . Years of education: Not on file  . Highest education level: Not on file  Occupational History  . Not on file  Social Needs  . Financial resource strain: Not on file  . Food insecurity    Worry: Not on file    Inability: Not on file  . Transportation needs    Medical: Not on file    Non-medical: Not on file  Tobacco Use  . Smoking status: Former  Smoker    Quit date: 08/31/1973    Years since quitting: 46.0  . Smokeless tobacco: Never Used  Substance and Sexual Activity  . Alcohol use: No    Alcohol/week: 0.0 standard drinks  . Drug use: No  . Sexual activity: Not on file  Lifestyle  . Physical activity    Days per week: Not on file    Minutes per session: Not on file  . Stress: Not on file  Relationships  . Social Herbalist on phone: Not on file    Gets together: Not on file    Attends religious service: Not on file    Active member of club or organization: Not on file    Attends meetings of clubs or organizations: Not on file    Relationship status: Not on file  . Intimate partner violence    Fear of current or ex partner: Not on file    Emotionally abused: Not on file    Physically abused: Not on file    Forced sexual activity: Not on file  Other Topics Concern  . Not on file  Social History Narrative  . Not on file   Socially he is married 32 years.  He has 2 children and 8 grandchildren.  He works part-time as a Teacher, early years/pre.  There is no tobacco or alcohol use.  Family History  Problem Relation Age of Onset  . Hypertension Father   . Alcohol abuse Father   . Cirrhosis Father   . Stomach cancer  Father   . Prostate cancer Neg Hx   . Colon cancer Neg Hx    Family history is notable in that his father died as result of liver cancer.  Mother died secondary to suicide.  ROS General: Negative; No fevers, chills, or night sweats HEENT: Positive for poor dentition; No changes in vision or hearing, sinus congestion, difficulty swallowing Pulmonary: Negative; No cough, wheezing, shortness of breath, hemoptysis Cardiovascular: See HPI: No chest pain, presyncope, syncope, palpatations GI: Negative; No nausea, vomiting, diarrhea, or abdominal pain GU: Negative; No dysuria, hematuria, or difficulty voiding Musculoskeletal: Negative; no myalgias, joint pain, or weakness Hematologic: Negative; no easy bruising, bleeding Endocrine: Negative; no heat/cold intolerance; no diabetes, Neuro: Negative; no changes in balance, headaches Skin: Negative; No rashes or skin lesions Psychiatric: Negative; No behavioral problems, depression Sleep: Negative; No snoring,  daytime sleepiness, hypersomnolence, bruxism, restless legs, hypnogognic hallucinations. Other comprehensive 14 point system review is negative   Physical Exam BP 126/70   Pulse 72   Temp (!) 96.3 F (35.7 C)   Ht _0  (1.778 m)   Wt 238 lb (108 kg)   SpO2 94%   BMI 34.15 kg/m    Repeat blood pressure by me was 180/70  Wt Readings from Last 3 Encounters:  09/08/19 238 lb (108 kg)  07/31/19 237 lb 3.4 oz (107.6 kg)  12/02/18 243 lb 4 oz (110.3 kg)   General: Alert, oriented, no distress.  Bearded. Skin: normal turgor, no rashes, warm and dry HEENT: Normocephalic, atraumatic. Pupils equal round and reactive to light; sclera anicteric; extraocular muscles intact;  Nose without nasal septal hypertrophy Mouth/Parynx benign; Mallinpatti scale 3 Neck: No JVD, no carotid bruits; normal carotid upstroke Lungs: clear to ausculatation and percussion; no wheezing or rales Chest wall: without tenderness to palpitation Heart: PMI not  displaced, RRR, s1 s2 normal, 1/6 systolic murmur, no diastolic murmur, no rubs, gallops, thrills, or heaves Abdomen: soft, nontender; no hepatosplenomehaly,  BS+; abdominal aorta nontender and not dilated by palpation. Back: no CVA tenderness Pulses 2+ Musculoskeletal: full range of motion, normal strength, no joint deformities Extremities: no clubbing cyanosis or edema, Homan's sign negative  Neurologic: grossly nonfocal; Cranial nerves grossly wnl Psychologic: Normal mood and affect   ECG (independently read by me): Normal sinus rhythm at 65 bpm, right bundle branch block, left anterior hemiblock.  LVH with repolarization changes.  QTc interval 445 ms.   November 2018 ECG (independently read by me): Normal sinus rhythm at 61 bpm.  Incomplete right bundle branch block.  LVH with repolarization changes.  QTc interval 465 ms.  March 2017 ECG (independently read by me): Normal sinus rhythm at 80 bpm.  Right bundle-branch block with repolarization changes.  QTc interval 452 ms.  Prior ECG (independently read by me): Normal sinus rhythm at 99 bpm.  Right bundle-branch block, left axis deviation.   LABS:  BMP Latest Ref Rng & Units 11/12/2018 07/05/2017 02/24/2016  Glucose 65 - 99 mg/dL 111(H) 68 105(H)  BUN 8 - 27 mg/dL _0 Creatinine 0.76 - 1.27 mg/dL 1.10 1.01 0.94  BUN/Creat Ratio 10 - _1 -  Sodium 134 - 144 mmol/L 138 139 137  Potassium 3.5 - 5.2 mmol/L 5.1 4.7 4.9  Chloride 96 - 106 mmol/L 101 100 103  CO2 20 - 29 mmol/L _2 Calcium 8.6 - 10.2 mg/dL 9.6 9.8 9.5     Hepatic Function Latest Ref Rng & Units 11/12/2018 02/24/2016  Total Protein 6.0 - 8.5 g/dL 6.7 6.9  Albumin 3.6 - 4.8 g/dL 4.3 4.3  AST 0 - 40 IU/L 23 25  ALT 0 - 44 IU/L 27 28  Alk Phosphatase 39 - 117 IU/L 69 74  Total Bilirubin 0.0 - 1.2 mg/dL 0.7 0.7    CBC Latest Ref Rng & Units 11/12/2018 02/24/2016 08/25/2013  WBC 3.4 - 10.8 x10E3/uL 7.0 7.1 7.2  Hemoglobin 13.0 - 17.7 g/dL 16.3 16.8 15.6   Hematocrit 37.5 - 51.0 % 46.7 47.5 43.0  Platelets 150 - 450 x10E3/uL 164 154 176   Lab Results  Component Value Date   MCV 91 11/12/2018   MCV 90.8 02/24/2016   MCV 89.6 08/25/2013    Lab Results  Component Value Date   TSH 1.69 02/24/2016    BNP No results found for: BNP  ProBNP    Component Value Date/Time   PROBNP 99.1 08/25/2013 1400     Lipid Panel     Component Value Date/Time   CHOL 170 11/12/2018 0822   TRIG 258 (H) 11/12/2018 0822   HDL 39 (L) 11/12/2018 0822   CHOLHDL 4.4 11/12/2018 0822   CHOLHDL 6.6 (H) 02/24/2016 0928   VLDL 43 (H) 02/24/2016 0928   LDLCALC 79 11/12/2018 0822     RADIOLOGY: No results found.  IMPRESSION:  1. Coronary artery disease involving native coronary artery of native heart without angina pectoris   2. Essential hypertension   3. LVH (left ventricular hypertrophy)   4. Grade II diastolic dysfunction   5. Mixed hyperlipidemia   6. Medication management   7. Class 1 obesity due to excess calories with serious comorbidity and body mass index (BMI) of 34.0 to 34.9 in adult     ASSESSMENT AND PLAN: Mr. Mort Smelser is a 68 year old white male who suffered an acute coronary syndrome in February 2012 at which time he underwent successful stenting of a subtotal proximal circumflex stenosis.  He underwent staged intervention  to high-grade proximal RCA stenosis several days later.  He had significant myocardial salvage with fairly normal perfusion without scar or ischemia on a Myoview study in March 2013.  At his last catheterization in August 2014 both stents were widely patent.  In January 2017, he underwent an echo Doppler study which revealed severe LVH with grade 2 diastolic dysfunction without significant valvular pathology.  He had mild elevation of pulmonary pressures.  A nuclear perfusion study showed normal perfusion.  His most recent echo in 2019 continue to show hyperdynamic LV function with moderate LVH, grade 2 diastolic  dysfunction, aortic valve sclerosis and mild MR.  His blood pressure today is stable on his medical regimen consisting of carvedilol 25 mg twice a day and lisinopril 40 mg daily.  He continues to take atorvastatin 20 mg daily for hyperlipidemia as well as over-the-counter omega-3 fatty acids.  Target LDL is less than 70.  He has not had lab work in the past year.  In 2019 triglycerides were elevated.  Fasting laboratory will be obtained.  If triglycerides remain elevated I would suggest initiating the vascepa 2 capsules twice a day.  BMI is consistent with obesity at 34.15.  I again discussed increased exercise and current recommendations of walking at least 5 days/week for 30 minutes at a time of moderate intensity if at all possible.  I will also check a hemoglobin A1c with his fasting laboratory.  I will contact him regarding the results.  He sees Dr. Dimas Chyle for primary care.  I will see him in 1 year for reevaluation or sooner if problems arise.   Time spent: 25 minutes Mario Sine, MD, [facility]  09/10/2019 7:53 PM

## 2019-09-08 NOTE — Patient Instructions (Signed)
Medication Instructions:  The current medical regimen is effective;  continue present plan and medications as directed. Please refer to the Current Medication list given to you today. If you need a refill on your cardiac medications before your next appointment, please call your pharmacy.  Labwork: FASTING LIPID, A1C, CMET, TSH, CBC HERE IN OUR OFFICE AT [facility]    You will need to fast. DO NOT EAT OR DRINK PAST MIDNIGHT.       Take the provided lab slips with you to the lab for your blood draw.   When you have your labs (blood work) drawn today and your tests are completely normal, you will receive your results only by MyChart Message (if you have MyChart) -OR-  A paper copy in the mail.  If you have any lab test that is abnormal or we need to change your treatment, we will call you to review these results.  Follow-Up: You will need a follow up appointment in 12 months.  Please call our office 3 months in advance, June 2021 to schedule this appointment.  You may see Shelva Majestic, MD or one of the following Advanced Practice Providers on your designated Care Team: [place], [place] . Fabian Sharp, PA-C     At [facility], you and your health needs are our priority.  As part of our continuing mission to provide you with exceptional heart care, we have created designated Provider Care Teams.  These Care Teams include your primary Cardiologist (physician) and Advanced Practice Providers (APPs -  Physician Assistants and Nurse Practitioners) who all work together to provide you with the care you need, when you need it.  Thank you for choosing [facility] at [facility]!!

## 2019-09-10 ENCOUNTER — Encounter: Payer: Self-pay | Admitting: Cardiovascular Disease

## 2019-11-05 ENCOUNTER — Telehealth: Payer: Self-pay | Admitting: Family Medicine

## 2019-11-05 NOTE — Telephone Encounter (Signed)
I left a message asking the patient to call and schedule Medicare AWV with Courtney ([facility] Health Coach).  If patient calls back, please schedule Medicare Wellness Visit at next available opening.  VDM (Dee-Dee) °

## 2019-12-09 ENCOUNTER — Other Ambulatory Visit: Payer: Self-pay

## 2019-12-09 MED ORDER — NITROGLYCERIN 0.4 MG SL SUBL: 0.4000 mg | SUBLINGUAL_TABLET | SUBLINGUAL | 3 refills | Status: DC | PRN

## 2019-12-09 MED ORDER — ATORVASTATIN CALCIUM 20 MG PO TABS: 20.0000 mg | ORAL_TABLET | Freq: Every day | ORAL | 2 refills | Status: DC

## 2019-12-09 MED ORDER — CARVEDILOL 25 MG PO TABS: 25.0000 mg | ORAL_TABLET | Freq: Two times a day (BID) | ORAL | 2 refills | Status: DC

## 2019-12-09 MED ORDER — LISINOPRIL 40 MG PO TABS: 40.0000 mg | ORAL_TABLET | Freq: Every day | ORAL | 2 refills | Status: DC

## 2020-02-05 ENCOUNTER — Telehealth: Payer: Self-pay | Admitting: Family Medicine

## 2020-02-05 NOTE — Telephone Encounter (Signed)
I left a message asking the pt to call and schedule AWV-I w/ Toni Amend and follow up visit w/ Dr. [name].

## 2020-03-31 ENCOUNTER — Other Ambulatory Visit: Payer: Self-pay | Admitting: Cardiovascular Disease

## 2020-03-31 ENCOUNTER — Telehealth: Payer: Self-pay | Admitting: Cardiovascular Disease

## 2020-03-31 MED ORDER — CARVEDILOL 25 MG PO TABS: 25.0000 mg | ORAL_TABLET | Freq: Two times a day (BID) | ORAL | 1 refills | Status: DC

## 2020-03-31 MED ORDER — LISINOPRIL 40 MG PO TABS: 40.0000 mg | ORAL_TABLET | Freq: Every day | ORAL | 1 refills | Status: DC

## 2020-03-31 MED ORDER — NITROGLYCERIN 0.4 MG SL SUBL: 0.4000 mg | SUBLINGUAL_TABLET | SUBLINGUAL | 3 refills | Status: AC | PRN

## 2020-03-31 MED ORDER — ATORVASTATIN CALCIUM 20 MG PO TABS: 20.0000 mg | ORAL_TABLET | Freq: Every day | ORAL | 1 refills | Status: DC

## 2020-03-31 NOTE — Telephone Encounter (Signed)
*  STAT* If patient is at the pharmacy, call can be transferred to refill team.   1. Which medications need to be refilled? (please list name of each medication and dose if known)  nitroGLYCERIN (NITROSTAT) 0.4 MG SL tablet  carvedilol (COREG) 25 MG tablet lisinopril (ZESTRIL) 40 MG tablet atorvastatin (LIPITOR) 20 MG tablet  2. Which pharmacy/location (including street and city if local pharmacy) is medication to be sent to?  [id number] - [place], [place] - [address] AT [facility] [address]  3. Do they need a 30 day or 90 day supply? 90  Pt is out of medication   This is the first time he will be using [facility] and will need all new rx sent over to the pharmacy

## 2020-03-31 NOTE — Telephone Encounter (Signed)
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 He would like Dr. Tresa Endo to order that so he can get it scheduled and completed

## 2020-03-31 NOTE — Telephone Encounter (Signed)
With the patient's diffuse ST changes and T wave inversion would recommend a nuclear perfusion study, probably Lexiscan Myoview. A routine treadmill test without nuclear imaging should not be performed. If he needs an exercise test this will need to be an exercise nuclear study

## 2020-04-01 ENCOUNTER — Other Ambulatory Visit: Payer: Self-pay

## 2020-04-01 DIAGNOSIS — I2510 Atherosclerotic heart disease of native coronary artery without angina pectoris: Secondary | ICD-10-CM

## 2020-04-01 DIAGNOSIS — I10 Essential (primary) hypertension: Secondary | ICD-10-CM

## 2020-04-01 DIAGNOSIS — I517 Cardiomegaly: Secondary | ICD-10-CM

## 2020-04-01 NOTE — Telephone Encounter (Signed)
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 States he has had a medically induced stress test before. He states it is fine to do this test again per DOT. Notified pt we would place an order for a lexi scan and our schedulers would call him to scheduled this test. Pt verbalized understanding with no other questions at this time.

## 2020-04-01 NOTE — Telephone Encounter (Signed)
Message sent to scheduling and orders for test placed.

## 2020-04-08 ENCOUNTER — Telehealth (HOSPITAL_COMMUNITY): Payer: Self-pay

## 2020-04-08 NOTE — Telephone Encounter (Signed)
Encounter complete. 

## 2020-04-13 ENCOUNTER — Other Ambulatory Visit: Payer: Self-pay

## 2020-04-13 ENCOUNTER — Ambulatory Visit (HOSPITAL_COMMUNITY)
Admission: RE | Admit: 2020-04-13 | Discharge: 2020-04-13 | Disposition: A | Discharge status: Home / Self Care | Payer: Medicare Other | Source: Ambulatory Visit | Attending: Internal Medicine | Admitting: Internal Medicine

## 2020-04-13 DIAGNOSIS — I10 Essential (primary) hypertension: Secondary | ICD-10-CM | POA: Diagnosis present

## 2020-04-13 DIAGNOSIS — I517 Cardiomegaly: Secondary | ICD-10-CM | POA: Diagnosis present

## 2020-04-13 DIAGNOSIS — I2510 Atherosclerotic heart disease of native coronary artery without angina pectoris: Secondary | ICD-10-CM | POA: Insufficient documentation

## 2020-04-13 LAB — MYOCARDIAL PERFUSION IMAGING
LV dias vol: 87 mL (ref 62–150)
LV sys vol: 28 mL
Peak HR: 75 {beats}/min
Rest HR: 60 {beats}/min
SDS: 0
SRS: 4
SSS: 4
TID: 1.08

## 2020-04-13 MED ORDER — AMINOPHYLLINE 25 MG/ML IV SOLN
75.0000 mg | Freq: Once | INTRAVENOUS | Status: AC
  Administered 2020-04-13: 75 mg via INTRAVENOUS

## 2020-04-13 MED ORDER — TECHNETIUM TC 99M TETROFOSMIN IV KIT
10.1000 | PACK | Freq: Once | INTRAVENOUS | Status: AC | PRN
  Administered 2020-04-13: 10.1 via INTRAVENOUS
  Filled 2020-04-13: qty 11

## 2020-04-13 MED ORDER — TECHNETIUM TC 99M TETROFOSMIN IV KIT
30.9000 | PACK | Freq: Once | INTRAVENOUS | Status: AC | PRN
  Administered 2020-04-13: 30.9 via INTRAVENOUS
  Filled 2020-04-13: qty 31

## 2020-04-13 MED ORDER — REGADENOSON 0.4 MG/5ML IV SOLN
0.4000 mg | Freq: Once | INTRAVENOUS | Status: AC
  Administered 2020-04-13: 0.4 mg via INTRAVENOUS

## 2020-04-26 ENCOUNTER — Telehealth: Payer: Self-pay

## 2020-04-26 ENCOUNTER — Other Ambulatory Visit: Payer: Self-pay | Admitting: Orthopaedic Surgery

## 2020-04-26 DIAGNOSIS — M25511 Pain in right shoulder: Secondary | ICD-10-CM

## 2020-04-26 DIAGNOSIS — M25512 Pain in left shoulder: Secondary | ICD-10-CM

## 2020-04-26 NOTE — Telephone Encounter (Signed)
   [facility] Pre-operative Risk Assessment    Request for surgical clearance:  1. What type of surgery is being performed? Left reverse total shoulder replacement   2. When is this surgery scheduled? TBD  3. What type of clearance is required (medical clearance vs. Pharmacy clearance to hold med vs. Both)? Both   4. Are there any medications that need to be held prior to surgery and how long? Aspirin  5. Practice name and name of physician performing surgery? [facility]  Dr.Dax Everardo [place]  6. What is your office phone number [phone]    7.   What is your office fax number [phone]  8.   Anesthesia type Not listed   Neoma Laming 04/26/2020, 3:31 PM  _________________________________________________________________   (provider comments below)

## 2020-04-27 ENCOUNTER — Telehealth: Payer: Self-pay | Admitting: *Deleted

## 2020-04-27 NOTE — Telephone Encounter (Signed)
   Primary Cardiologist: Nicki Guadalajara, MD  Chart reviewed as part of pre-operative protocol coverage. He just had normal NM stress test 04/13/2020. Given past medical history and time since last visit, based on ACC/AHA guidelines, [facility] would be at acceptable risk for the planned procedure without further cardiovascular testing.   I will route this recommendation to the requesting party via Epic fax function and remove from pre-op pool.  Please call with questions.  Bettey Mare. Liborio Nixon, ANP, AACC  04/27/2020, 2:40 PM

## 2020-04-27 NOTE — Telephone Encounter (Signed)
Called Patient, advised need OV for surgical clearance  Stated will call to schedule appointment.

## 2020-05-27 ENCOUNTER — Other Ambulatory Visit: Payer: Medicare Other

## 2020-11-01 ENCOUNTER — Other Ambulatory Visit: Payer: Self-pay | Admitting: Physician Assistant

## 2020-11-07 ENCOUNTER — Other Ambulatory Visit: Payer: Self-pay | Admitting: Physician Assistant

## 2020-12-19 ENCOUNTER — Telehealth (INDEPENDENT_AMBULATORY_CARE_PROVIDER_SITE_OTHER): Payer: Medicare Other | Admitting: Family Medicine

## 2020-12-19 ENCOUNTER — Other Ambulatory Visit: Payer: Self-pay

## 2020-12-19 ENCOUNTER — Inpatient Hospital Stay (HOSPITAL_COMMUNITY)
Admission: EM | Admit: 2020-12-19 | Discharge: 2020-12-22 | DRG: 177 | Disposition: A | Discharge status: Home / Self Care | Payer: Medicare Other | Attending: Internal Medicine | Admitting: Internal Medicine

## 2020-12-19 ENCOUNTER — Encounter (HOSPITAL_COMMUNITY): Payer: Self-pay

## 2020-12-19 DIAGNOSIS — I2510 Atherosclerotic heart disease of native coronary artery without angina pectoris: Secondary | ICD-10-CM | POA: Diagnosis present

## 2020-12-19 DIAGNOSIS — E861 Hypovolemia: Secondary | ICD-10-CM | POA: Diagnosis present

## 2020-12-19 DIAGNOSIS — R112 Nausea with vomiting, unspecified: Secondary | ICD-10-CM | POA: Diagnosis not present

## 2020-12-19 DIAGNOSIS — R197 Diarrhea, unspecified: Secondary | ICD-10-CM

## 2020-12-19 DIAGNOSIS — Z20822 Contact with and (suspected) exposure to covid-19: Secondary | ICD-10-CM

## 2020-12-19 DIAGNOSIS — E860 Dehydration: Secondary | ICD-10-CM | POA: Diagnosis present

## 2020-12-19 DIAGNOSIS — E669 Obesity, unspecified: Secondary | ICD-10-CM | POA: Diagnosis present

## 2020-12-19 DIAGNOSIS — Z6834 Body mass index (BMI) 34.0-34.9, adult: Secondary | ICD-10-CM

## 2020-12-19 DIAGNOSIS — E785 Hyperlipidemia, unspecified: Secondary | ICD-10-CM | POA: Diagnosis present

## 2020-12-19 DIAGNOSIS — I252 Old myocardial infarction: Secondary | ICD-10-CM

## 2020-12-19 DIAGNOSIS — J9601 Acute respiratory failure with hypoxia: Secondary | ICD-10-CM | POA: Diagnosis present

## 2020-12-19 DIAGNOSIS — E871 Hypo-osmolality and hyponatremia: Secondary | ICD-10-CM | POA: Diagnosis present

## 2020-12-19 DIAGNOSIS — U071 COVID-19: Principal | ICD-10-CM | POA: Diagnosis present

## 2020-12-19 DIAGNOSIS — Z79899 Other long term (current) drug therapy: Secondary | ICD-10-CM

## 2020-12-19 DIAGNOSIS — N179 Acute kidney failure, unspecified: Secondary | ICD-10-CM | POA: Diagnosis present

## 2020-12-19 DIAGNOSIS — K219 Gastro-esophageal reflux disease without esophagitis: Secondary | ICD-10-CM | POA: Diagnosis present

## 2020-12-19 DIAGNOSIS — J1282 Pneumonia due to coronavirus disease 2019: Secondary | ICD-10-CM | POA: Diagnosis present

## 2020-12-19 DIAGNOSIS — I10 Essential (primary) hypertension: Secondary | ICD-10-CM | POA: Diagnosis present

## 2020-12-19 DIAGNOSIS — R55 Syncope and collapse: Secondary | ICD-10-CM

## 2020-12-19 DIAGNOSIS — Z7982 Long term (current) use of aspirin: Secondary | ICD-10-CM

## 2020-12-19 DIAGNOSIS — A0839 Other viral enteritis: Secondary | ICD-10-CM | POA: Diagnosis present

## 2020-12-19 DIAGNOSIS — D6959 Other secondary thrombocytopenia: Secondary | ICD-10-CM | POA: Diagnosis present

## 2020-12-19 DIAGNOSIS — Z87891 Personal history of nicotine dependence: Secondary | ICD-10-CM

## 2020-12-19 DIAGNOSIS — Z955 Presence of coronary angioplasty implant and graft: Secondary | ICD-10-CM

## 2020-12-19 MED ORDER — ONDANSETRON HCL 4 MG PO TABS: 4.0000 mg | ORAL_TABLET | Freq: Three times a day (TID) | ORAL | 0 refills | Status: AC | PRN

## 2020-12-19 NOTE — ED Triage Notes (Signed)
Patient reports nausea, vomiting, diarrhea x 3, has not been eating and drinking well, +covid exposure on christmas, wife reports she found him tonight on toilet not acting right.

## 2020-12-19 NOTE — Patient Instructions (Signed)
-  I sent the medication(s) we discussed to your pharmacy: Meds ordered this encounter  Medications  . ondansetron (ZOFRAN) 4 MG tablet    Sig: Take 1 tablet (4 mg total) by mouth every 8 (eight) hours as needed for nausea or vomiting.    Dispense:  20 tablet    Refill:  0   Can use Imodium, this is over-the-counter, as needed for the diarrhea.  Ensure adequate hydration plenty of fluids with electrolytes, bland diet and avoid dairy and red meat for 1 week.  I hope you are feeling better soon!  Seek in person care promptly if your symptoms worsen, new concerns arise or you are not improving with treatment over the next 12 to 24 hours.  It was nice to meet you today. I help [facility] out with telemedicine visits on Tuesdays and Thursdays and am available for visits on those days. If you have any concerns or questions following this visit please schedule a follow up visit with your Primary Care doctor or seek care at a local urgent care clinic to avoid delays in care.

## 2020-12-19 NOTE — Progress Notes (Signed)
Virtual Visit via Video Note  I connected with Duffy Rhody  on 12/19/20 at  3:40 PM EST by a video enabled telemedicine application and verified that I am speaking with the correct person using two identifiers.  Location patient: home, [place] Location provider:work or home office Persons participating in the virtual visit: patient, provider  I discussed the limitations of evaluation and management by telemedicine and the availability of in person appointments. The patient expressed understanding and agreed to proceed.   HPI:  Acute telemedicine visit for 3 days ago: -Onset: reports got covid 8 days ago (he didn't test, but close contacts including daughter and all her family had it and tested positive and his wife had it) -Symptoms include: had ha, NVD, cough, malaise, body aches -GI symptoms have persisted - vomiting x1 today, diarrhea x 4 today -drinking fluids and keeping them down, but throws up every time he eats -Denies: sob, CP, melena, hematochezia, abd pain,  inability to get out of bed -Has tried:nothing -Pertinent past medical history:HTN, CAD, HTN -Pertinent medication allergies:crestor -COVID-19 vaccine status: not vaccinated  ROS: See pertinent positives and negatives per HPI.  Past Medical History:  Diagnosis Date  . Hyperlipidemia   . Hypertension   . MI (myocardial infarction) (HCC)    2012    Past Surgical History:  Procedure Laterality Date  . CARDIAC CATHETERIZATION  02/06/2011   normal LV; CAD 40% mid left  LADafter thsecond diagonal;90-95% culprit stenosis prox.circ ;80-90% stenosis prox RCA.  Marland Kitchen CARDIAC CATHETERIZATION  11/24 /2003   normal cors  . CORONARY ANGIOPLASTY  02/06/2011   large dominant left circ sytem treated w/cutting balloon arthrotomy,stenting with 3.5 x 16 mm eerolimus Promus stent,postdilated to 4.0 mm  . CORONARY STENT PLACEMENT    . DOPPLER ECHOCARDIOGRAPHY  02/07/2011   EF 50 to 55%  mild -mod posterior wall hypokinesis,pulm vein flow  pattern normal    . EVALUATION UNDER ANESTHESIA WITH HEMORRHOIDECTOMY N/A 07/31/2019   Procedure: EXAM UNDER ANESTHESIA WITH RUBBER BAND LIGATION OF INTERNAL HEMORRHOIDS;  Surgeon: Darnell Level, MD;  Location: [facility];  Service: General;  Laterality: N/A;  . FISSURECTOMY N/A 07/31/2019   Procedure: FISSURECTOMY;  Surgeon: Darnell Level, MD;  Location: [facility];  Service: General;  Laterality: N/A;  . KNEE SURGERY    . LEFT HEART CATHETERIZATION WITH CORONARY ANGIOGRAM N/A 08/10/2013   Procedure: LEFT HEART CATHETERIZATION WITH CORONARY ANGIOGRAM;  Surgeon: Lennette Bihari, MD;  Location: [facility] CATH [facility];  Service: Cardiovascular;  Laterality: N/A;  . NM MYOCAR PERF WALL MOTION  02/19/2012   EF 61% ,NO SIGNIFICANT WALL MOTION ABNORMALITIES NOTED  . SPHINCTEROTOMY N/A 07/31/2019   Procedure: LATERAL INTERNAL SPHINCTEROTOMY;  Surgeon: Darnell Level, MD;  Location: [facility];  Service: General;  Laterality: N/A;  . VASECTOMY       Current Outpatient Medications:  .  ondansetron (ZOFRAN) 4 MG tablet, Take 1 tablet (4 mg total) by mouth every 8 (eight) hours as needed for nausea or vomiting., Disp: 20 tablet, Rfl: 0 .  aspirin EC 81 MG tablet, Take 81 mg by mouth daily., Disp: , Rfl:  .  atorvastatin (LIPITOR) 20 MG tablet, TAKE 1 TABLET(20 MG) BY MOUTH DAILY, Disp: 90 tablet, Rfl: 1 .  carvedilol (COREG) 25 MG tablet, TAKE 1 TABLET(25 MG) BY MOUTH TWICE DAILY WITH A MEAL, Disp: 180 tablet, Rfl: 1 .  hydrocortisone (ANUSOL-HC) 2.5 % rectal cream, Place 1 application rectally 2 (two) times daily., Disp: 30 g,  Rfl: 0 .  lidocaine (XYLOCAINE) 5 % ointment, Apply 1 application topically 4 (four) times daily as needed., Disp: 35.44 g, Rfl: 0 .  lisinopril (ZESTRIL) 40 MG tablet, TAKE 1 TABLET(40 MG) BY MOUTH DAILY, Disp: 90 tablet, Rfl: 1 .  nitroGLYCERIN (NITROSTAT) 0.4 MG SL tablet, Place 1 tablet (0.4 mg total) under the tongue every 5 (five) minutes x 3 doses  as needed for chest pain., Disp: 25 tablet, Rfl: 3 .  Omega-3 Fatty Acids (FISH OIL) 1000 MG CAPS, Take 1 capsule (1,000 mg total) by mouth daily., Disp: 30 capsule, Rfl: 6 .  oxyCODONE (OXY IR/ROXICODONE) 5 MG immediate release tablet, Take 1-2 tablets (5-10 mg total) by mouth every 6 (six) hours as needed for moderate pain, severe pain or breakthrough pain., Disp: 30 tablet, Rfl: 0  EXAM:  VITALS per patient if applicable:  GENERAL: alert, oriented, appears well and in no acute distress  HEENT: atraumatic, conjunttiva clear, no obvious abnormalities on inspection of external nose and ears  NECK: normal movements of the head and neck  LUNGS: on inspection no signs of respiratory distress, breathing rate appears normal, no obvious gross SOB, gasping or wheezing  CV: no obvious cyanosis  MS: moves all visible extremities without noticeable abnormality  PSYCH/NEURO: pleasant and cooperative, no obvious depression or anxiety, speech and thought processing grossly intact  ASSESSMENT AND PLAN:  Discussed the following assessment and plan:  Close exposure to COVID-19 virus  Nausea and vomiting, intractability of vomiting not specified, unspecified vomiting type  Diarrhea, unspecified type  -we discussed possible serious and likely etiologies, options for evaluation and workup, limitations of telemedicine visit vs in person visit, treatment, treatment risks and precautions. Pt prefers to treat via telemedicine empirically rather than in person at this moment.  Suspect COVID-19 given close exposure and symptoms versus other.  Discussed potential treatment options, potential complications, precautions and isolation.  Given this is day 8, still with nausea and vomiting and diarrhea and difficulty keeping foods down, advised consideration of seeking in person evaluation and higher level care in urgent care.  However, they would like to try an antiemetic first and opted to try Zofran 4 mg every 8  hours as needed along with Imodium as needed for the diarrhea.  Discussed oral hydration and bland diet without dairy or red meat.  He denies any respiratory symptoms or chest pain at this time.  Reports still is able to get up and around.  Advised low threshold to seek in person care if worsening or not improving over the next 24 hours. Scheduled follow up with PCP offered: Declined , but agrees to follow-up if needed Advised to seek prompt in person care if worsening, new symptoms arise, or if is not improving with treatment. Discussed options for inperson care if PCP office not available. Did let this patient know that I only do telemedicine on Tuesdays and Thursdays for [facility]. Advised to schedule follow up visit with PCP or [facility] if any further questions or concerns to avoid delays in care.   I discussed the assessment and treatment plan with the patient. The patient was provided an opportunity to ask questions and all were answered. The patient agreed with the plan and demonstrated an understanding of the instructions.     Terressa Koyanagi, DO

## 2020-12-20 ENCOUNTER — Observation Stay (HOSPITAL_COMMUNITY): Payer: Medicare Other

## 2020-12-20 ENCOUNTER — Emergency Department (HOSPITAL_COMMUNITY): Payer: Medicare Other

## 2020-12-20 DIAGNOSIS — E860 Dehydration: Secondary | ICD-10-CM | POA: Diagnosis present

## 2020-12-20 DIAGNOSIS — Z7982 Long term (current) use of aspirin: Secondary | ICD-10-CM | POA: Diagnosis not present

## 2020-12-20 DIAGNOSIS — U071 COVID-19: Secondary | ICD-10-CM | POA: Diagnosis present

## 2020-12-20 DIAGNOSIS — E669 Obesity, unspecified: Secondary | ICD-10-CM | POA: Diagnosis present

## 2020-12-20 DIAGNOSIS — Z87891 Personal history of nicotine dependence: Secondary | ICD-10-CM | POA: Diagnosis not present

## 2020-12-20 DIAGNOSIS — J1282 Pneumonia due to coronavirus disease 2019: Secondary | ICD-10-CM | POA: Diagnosis present

## 2020-12-20 DIAGNOSIS — D6959 Other secondary thrombocytopenia: Secondary | ICD-10-CM | POA: Diagnosis present

## 2020-12-20 DIAGNOSIS — E871 Hypo-osmolality and hyponatremia: Secondary | ICD-10-CM | POA: Diagnosis present

## 2020-12-20 DIAGNOSIS — I2510 Atherosclerotic heart disease of native coronary artery without angina pectoris: Secondary | ICD-10-CM | POA: Diagnosis present

## 2020-12-20 DIAGNOSIS — I10 Essential (primary) hypertension: Secondary | ICD-10-CM | POA: Diagnosis present

## 2020-12-20 DIAGNOSIS — R55 Syncope and collapse: Secondary | ICD-10-CM | POA: Diagnosis not present

## 2020-12-20 DIAGNOSIS — Z6834 Body mass index (BMI) 34.0-34.9, adult: Secondary | ICD-10-CM | POA: Diagnosis not present

## 2020-12-20 DIAGNOSIS — E785 Hyperlipidemia, unspecified: Secondary | ICD-10-CM | POA: Diagnosis present

## 2020-12-20 DIAGNOSIS — J9601 Acute respiratory failure with hypoxia: Secondary | ICD-10-CM | POA: Diagnosis present

## 2020-12-20 DIAGNOSIS — Z79899 Other long term (current) drug therapy: Secondary | ICD-10-CM | POA: Diagnosis not present

## 2020-12-20 DIAGNOSIS — I252 Old myocardial infarction: Secondary | ICD-10-CM | POA: Diagnosis not present

## 2020-12-20 DIAGNOSIS — A0839 Other viral enteritis: Secondary | ICD-10-CM | POA: Diagnosis present

## 2020-12-20 DIAGNOSIS — Z955 Presence of coronary angioplasty implant and graft: Secondary | ICD-10-CM | POA: Diagnosis not present

## 2020-12-20 DIAGNOSIS — E861 Hypovolemia: Secondary | ICD-10-CM | POA: Diagnosis present

## 2020-12-20 DIAGNOSIS — N179 Acute kidney failure, unspecified: Secondary | ICD-10-CM | POA: Diagnosis present

## 2020-12-20 DIAGNOSIS — K219 Gastro-esophageal reflux disease without esophagitis: Secondary | ICD-10-CM | POA: Diagnosis present

## 2020-12-20 LAB — CBC WITH DIFFERENTIAL/PLATELET
Abs Immature Granulocytes: 0.01 10*3/uL (ref 0.00–0.07)
Basophils Absolute: 0 10*3/uL (ref 0.0–0.1)
Basophils Relative: 0 %
Eosinophils Absolute: 0 10*3/uL (ref 0.0–0.5)
Eosinophils Relative: 0 %
HCT: 43.2 % (ref 39.0–52.0)
Hemoglobin: 15.3 g/dL (ref 13.0–17.0)
Immature Granulocytes: 0 %
Lymphocytes Relative: 29 %
Lymphs Abs: 1.3 10*3/uL (ref 0.7–4.0)
MCH: 32.5 pg (ref 26.0–34.0)
MCHC: 35.4 g/dL (ref 30.0–36.0)
MCV: 91.7 fL (ref 80.0–100.0)
Monocytes Absolute: 0.4 10*3/uL (ref 0.1–1.0)
Monocytes Relative: 9 %
Neutro Abs: 2.7 10*3/uL (ref 1.7–7.7)
Neutrophils Relative %: 62 %
Platelets: 85 10*3/uL — ABNORMAL LOW (ref 150–400)
RBC: 4.71 MIL/uL (ref 4.22–5.81)
RDW: 12.8 % (ref 11.5–15.5)
WBC: 4.4 10*3/uL (ref 4.0–10.5)
nRBC: 0 % (ref 0.0–0.2)

## 2020-12-20 LAB — RESP PANEL BY RT-PCR (FLU A&B, COVID) ARPGX2
Influenza A by PCR: NEGATIVE
Influenza B by PCR: NEGATIVE
SARS Coronavirus 2 by RT PCR: POSITIVE — AB

## 2020-12-20 LAB — BASIC METABOLIC PANEL
Anion gap: 10 (ref 5–15)
BUN: 20 mg/dL (ref 8–23)
CO2: 25 mmol/L (ref 22–32)
Calcium: 8.8 mg/dL — ABNORMAL LOW (ref 8.9–10.3)
Chloride: 97 mmol/L — ABNORMAL LOW (ref 98–111)
Creatinine, Ser: 1.82 mg/dL — ABNORMAL HIGH (ref 0.61–1.24)
GFR, Estimated: 40 mL/min — ABNORMAL LOW (ref >60)
Glucose, Bld: 139 mg/dL — ABNORMAL HIGH (ref 70–99)
Potassium: 4.4 mmol/L (ref 3.5–5.1)
Sodium: 132 mmol/L — ABNORMAL LOW (ref 135–145)

## 2020-12-20 LAB — URINALYSIS, ROUTINE W REFLEX MICROSCOPIC
Bacteria, UA: NONE SEEN
Bilirubin Urine: NEGATIVE
Glucose, UA: NEGATIVE mg/dL
Hgb urine dipstick: NEGATIVE
Ketones, ur: NEGATIVE mg/dL
Leukocytes,Ua: NEGATIVE
Nitrite: NEGATIVE
Protein, ur: 30 mg/dL — AB
Specific Gravity, Urine: 1.012 (ref 1.005–1.030)
pH: 5 (ref 5.0–8.0)

## 2020-12-20 LAB — COMPREHENSIVE METABOLIC PANEL
ALT: 27 U/L (ref 0–44)
AST: 46 U/L — ABNORMAL HIGH (ref 15–41)
Albumin: 3.3 g/dL — ABNORMAL LOW (ref 3.5–5.0)
Alkaline Phosphatase: 38 U/L (ref 38–126)
Anion gap: 11 (ref 5–15)
BUN: 26 mg/dL — ABNORMAL HIGH (ref 8–23)
CO2: 18 mmol/L — ABNORMAL LOW (ref 22–32)
Calcium: 8.2 mg/dL — ABNORMAL LOW (ref 8.9–10.3)
Chloride: 99 mmol/L (ref 98–111)
Creatinine, Ser: 2.67 mg/dL — ABNORMAL HIGH (ref 0.61–1.24)
GFR, Estimated: 25 mL/min — ABNORMAL LOW (ref >60)
Glucose, Bld: 150 mg/dL — ABNORMAL HIGH (ref 70–99)
Potassium: 4.1 mmol/L (ref 3.5–5.1)
Sodium: 128 mmol/L — ABNORMAL LOW (ref 135–145)
Total Bilirubin: 0.9 mg/dL (ref 0.3–1.2)
Total Protein: 5.8 g/dL — ABNORMAL LOW (ref 6.5–8.1)

## 2020-12-20 LAB — ECHOCARDIOGRAM COMPLETE
Area-P 1/2: 2.99 cm2
Calc EF: 67 %
Height: 70 in
S' Lateral: 2.65 cm
Single Plane A2C EF: 70.2 %
Single Plane A4C EF: 65.1 %
Weight: 3840 oz

## 2020-12-20 LAB — CBC
HCT: 46.4 % (ref 39.0–52.0)
Hemoglobin: 16.2 g/dL (ref 13.0–17.0)
MCH: 31.7 pg (ref 26.0–34.0)
MCHC: 34.9 g/dL (ref 30.0–36.0)
MCV: 90.8 fL (ref 80.0–100.0)
Platelets: 93 10*3/uL — ABNORMAL LOW (ref 150–400)
RBC: 5.11 MIL/uL (ref 4.22–5.81)
RDW: 12.6 % (ref 11.5–15.5)
WBC: 4.6 10*3/uL (ref 4.0–10.5)
nRBC: 0 % (ref 0.0–0.2)

## 2020-12-20 LAB — C-REACTIVE PROTEIN: CRP: 5.8 mg/dL — ABNORMAL HIGH (ref <1.0)

## 2020-12-20 LAB — LACTIC ACID, PLASMA
Lactic Acid, Venous: 1.4 mmol/L (ref 0.5–1.9)
Lactic Acid, Venous: 1.2 mmol/L (ref 0.5–1.9)

## 2020-12-20 LAB — HIV ANTIBODY (ROUTINE TESTING W REFLEX): HIV Screen 4th Generation wRfx: NONREACTIVE

## 2020-12-20 LAB — FIBRINOGEN: Fibrinogen: 551 mg/dL — ABNORMAL HIGH (ref 210–475)

## 2020-12-20 LAB — TRIGLYCERIDES: Triglycerides: 118 mg/dL (ref <150)

## 2020-12-20 LAB — D-DIMER, QUANTITATIVE: D-Dimer, Quant: 0.74 ug/mL-FEU — ABNORMAL HIGH (ref 0.00–0.50)

## 2020-12-20 LAB — CBG MONITORING, ED: Glucose-Capillary: 136 mg/dL — ABNORMAL HIGH (ref 70–99)

## 2020-12-20 LAB — PROCALCITONIN: Procalcitonin: 0.37 ng/mL

## 2020-12-20 LAB — FERRITIN: Ferritin: 430 ng/mL — ABNORMAL HIGH (ref 24–336)

## 2020-12-20 LAB — LACTATE DEHYDROGENASE: LDH: 278 U/L — ABNORMAL HIGH (ref 98–192)

## 2020-12-20 MED ORDER — ENOXAPARIN SODIUM 40 MG/0.4ML ~~LOC~~ SOLN
40.0000 mg | Freq: Every day | SUBCUTANEOUS | Status: DC
  Administered 2020-12-20 – 2020-12-22 (×3): 40 mg via SUBCUTANEOUS
  Filled 2020-12-20 (×3): qty 0.4

## 2020-12-20 MED ORDER — CARVEDILOL 25 MG PO TABS
25.0000 mg | ORAL_TABLET | Freq: Two times a day (BID) | ORAL | Status: DC
  Administered 2020-12-20 – 2020-12-22 (×4): 25 mg via ORAL
  Filled 2020-12-20: qty 1
  Filled 2020-12-20: qty 2
  Filled 2020-12-20: qty 1
  Filled 2020-12-20: qty 8

## 2020-12-20 MED ORDER — ATORVASTATIN CALCIUM 10 MG PO TABS
20.0000 mg | ORAL_TABLET | Freq: Every day | ORAL | Status: DC
  Administered 2020-12-20 – 2020-12-22 (×3): 20 mg via ORAL
  Filled 2020-12-20 (×3): qty 2

## 2020-12-20 MED ORDER — AZITHROMYCIN 250 MG PO TABS
250.0000 mg | ORAL_TABLET | Freq: Every day | ORAL | Status: DC
  Administered 2020-12-21: 250 mg via ORAL
  Filled 2020-12-20: qty 1

## 2020-12-20 MED ORDER — SODIUM CHLORIDE 0.9 % IV BOLUS
500.0000 mL | Freq: Once | INTRAVENOUS | Status: AC
  Administered 2020-12-20: 500 mL via INTRAVENOUS

## 2020-12-20 MED ORDER — SODIUM CHLORIDE 0.9 % IV SOLN
2.0000 g | INTRAVENOUS | Status: DC
  Administered 2020-12-20 – 2020-12-21 (×2): 2 g via INTRAVENOUS
  Filled 2020-12-20 (×2): qty 20

## 2020-12-20 MED ORDER — METHYLPREDNISOLONE SODIUM SUCC 125 MG IJ SOLR
100.0000 mg | Freq: Once | INTRAMUSCULAR | Status: AC
  Administered 2020-12-20: 100 mg via INTRAVENOUS
  Filled 2020-12-20: qty 2

## 2020-12-20 MED ORDER — SODIUM CHLORIDE 0.9 % IV SOLN: INTRAVENOUS | Status: AC

## 2020-12-20 MED ORDER — ASPIRIN EC 81 MG PO TBEC
81.0000 mg | DELAYED_RELEASE_TABLET | Freq: Every day | ORAL | Status: DC
  Administered 2020-12-20 – 2020-12-22 (×3): 81 mg via ORAL
  Filled 2020-12-20 (×3): qty 1

## 2020-12-20 MED ORDER — PREDNISONE 20 MG PO TABS
50.0000 mg | ORAL_TABLET | Freq: Every day | ORAL | Status: DC
  Administered 2020-12-21: 50 mg via ORAL
  Filled 2020-12-20: qty 3

## 2020-12-20 MED ORDER — AZITHROMYCIN 250 MG PO TABS
500.0000 mg | ORAL_TABLET | Freq: Every day | ORAL | Status: AC
  Administered 2020-12-20: 500 mg via ORAL
  Filled 2020-12-20: qty 2

## 2020-12-20 NOTE — ED Notes (Signed)
Dinner Tray Ordered @ 1713. 

## 2020-12-20 NOTE — ED Notes (Signed)
Tele  Breakfast Ordered 

## 2020-12-20 NOTE — Progress Notes (Signed)
No charge progress note.  Mario Torres is a 70 y.o. male with medical history significant for hypertension, history of anal fissure status post fissurectomy, lateral internal sphincterotomy, hemorrhoids status post ligation of internal hemorrhoid, hyperlipidemia, unvaccinated for COVID-19, presents emergency department for chief concerns of passing out on the toilet.  Patient is having diarrhea started around 12/13/2020.  Endorsed 2 days of nausea and vomiting along with poor p.o. intake.  He also endorses 15 pound unintentional weight loss.  Found to be positive for COVID-19.  Patient is unvaccinated. He was started on steroid.  He was not given remdesivir as possible he had Covid approximately for 10 days.  Chest x-ray with a concerning 3 cm opacity at right apex, possibly a primary pulmonary mass, there is another left peripheral pulmonary infiltrate versus pleural mass.  Patient needs dedicated contrast enhanced CT exam of the chest which can be done once renal function improve.  Patient was feeling little better when seen during morning rounds.  Mildly elevated inflammatory markers.  He was saturating in low 90s on 3 L of oxygen.  Continue with current management.

## 2020-12-20 NOTE — ED Notes (Signed)
Patient stating he has not received his dinner. Nutrition called to check on status. Per nutrition meal tray was delivered, though no meal tray present in patients room. Dinner tray will be sent down at this time.

## 2020-12-20 NOTE — H&P (Signed)
History and Physical   Mario Torres ZOX:[id number] DOB: 01-08-1951 DOA: 12/19/2020  PCP: Vivi Barrack, MD  Outpatient Specialists: Heart care Patient coming from: Home  I have personally briefly reviewed patient's old medical records in [place].  Chief Concern: Passed out  HPI: Mario Torres is a 70 y.o. male with medical history significant for hypertension, history of anal fissure status post fissurectomy, lateral internal sphincterotomy, hemorrhoids status post ligation of internal hemorrhoid, hyperlipidemia, unvaccinated for COVID-19, presents emergency department for chief concerns of passing out on the toilet.  He states he was sitting on toilet, had watery diarrhea and his wife stated that he had passed out on the toilet.  Patient did not know that he passed out.  He reports that since after Christmas, patient has had diarrhea 4x/per day since last Tuesday (12/13/20) or Wednesday (12/14/20). He denies blood. He states the color of his diarrhea is light brown.   Furthermore, he endorses nausea and vomiting x 2/day since after Christmas. He endorses poor appetite and poor PO intake (fluid and food) since after Christmas.  He endorses 15 pound unintentional weight loss in the last two weeks. He endorses poor appetite since coivd diagnosis. HE did not loose weight before covid diagnosis.   He denies this happening before.   He denies fever at home.   Social History: lives with spouse. He is retired and formerly worked as a Recruitment consultant. He denies ever smoking or using tobacco products, etoh, recreational drugs.   Vaccination: Patient is unvaccinated for COVID-19.  At bedside he states he will not be vaccinated.  ROS: Constitutional: + weight change, no fever ENT/Mouth: no sore throat, no rhinorrhea Eyes: no eye pain, no vision changes Cardiovascular: no chest pain, + dyspnea,  no edema, + palpitations Respiratory: + cough, + sputum (white sputum), +  wheezing Gastrointestinal: + nausea, + vomiting, + diarrhea, no constipation Genitourinary: no urinary incontinence, no dysuria, no hematuria Musculoskeletal: no arthralgias, no myalgias Skin: no skin lesions, no pruritus, Neuro: + weakness, + loss of consciousness, no syncope Psych: no anxiety, no depression, + decrease appetite Heme/Lymph: no bruising, no bleeding  ED Course: Discussed with ED provider, patient requiring hospitalization due to oxygen supplementation needed in COVID-19 infection and acute kidney injury  Assessment/Plan  Principal Problem:   Acute hypoxemic respiratory failure due to COVID-19 [facility]) Active Problems:   CAD- s/p PCI w/ DES to circumflex, and RCA in 2012- patent stents on re-look cath 08/10/13   HTN (hypertension)   GERD (gastroesophageal reflux disease)   AKI (acute kidney injury) ([place])   Gastroenteritis due to COVID-19 virus   Loss of consciousness event-multifactorial including acute hypoxic respiratory failure versus dehydration versus vasovagal -Echo ordered to assess for valvular and or cardiac structure changes or abnormality -Orthostatic vital signs ordered  Acute hypoxic respiratory failure secondary to COVID-19 -Requiring oxygen supplementation, at bedside on 2 L nasal cannula, patient was saturating at 90-91 percent SPO2 -Previously not on home oxygen -Pending anti-inflammatory markers -Solu-Medrol 100 mg IV once as patient is requiring supplemental oxygenation and is wheezing on physical exam -Patient was exposed to COVID-19 on Christmas, which was greater than 10 days ago, at this point I do not believe benefits of remdesivir outweighs the risk -Airborne and contact precautions -Cardiac monitoring  Acute kidney injury-secondary to prerenal in setting of GI loss and poor p.o. intake -Status post 1 L total of normal saline bolus in the ED -Holding home lisinopril 40 mg at this time -  Normal saline IVF at 125 cc/h for 4 hours -BMP  recheck -Insetting of GI loss and acute kidney injury, we will replace fluids conservatively in setting of active COVID-19 infection with acute hypoxic respiratory failure now requiring oxygen  Hypertension-now low normotensive secondary to GI loss, holding lisinopril -Resumed carvedilol 25 mg twice daily with meals  History of ischemic heart disease status post DES -Nuclear medicine stress test on 04/13/2020 was read as no ST segment deviation noted during stress, no T wave inversion noted during stress.  Arrhythmia during stress was none.  Arrhythmias during recovery was none.  There were no significant arrhythmias noted during the test.  ECG was interpretable and nonconclusive. -Continue outpatient cardiac follow-up -Resumed Coreg 25 mg twice daily, aspirin 81 mg, Atorvastatin 20 mg nightly  Dyslipidemia-atorvastatin 20 mg nightly  Thrombocytopenia-continue to monitor  Chart reviewed.   DVT prophylaxis: Enoxaparin Code Status: limited, no intubation  Diet: Cardiac heart healthy Family Communication: A.m. team to update spouse during business hours Disposition Plan: Pending clinical course Consults called: None at this time Admission status: observation  Past Medical History:  Diagnosis Date  . Hyperlipidemia   . Hypertension   . MI (myocardial infarction) (HCC)    2012   Past Surgical History:  Procedure Laterality Date  . CARDIAC CATHETERIZATION  02/06/2011   normal LV; CAD 40% mid left  LADafter thsecond diagonal;90-95% culprit stenosis prox.circ ;80-90% stenosis prox RCA.  Marland Kitchen CARDIAC CATHETERIZATION  11/24 /2003   normal cors  . CORONARY ANGIOPLASTY  02/06/2011   large dominant left circ sytem treated w/cutting balloon arthrotomy,stenting with 3.5 x 16 mm eerolimus Promus stent,postdilated to 4.0 mm  . CORONARY STENT PLACEMENT    . DOPPLER ECHOCARDIOGRAPHY  02/07/2011   EF 50 to 55%  mild -mod posterior wall hypokinesis,pulm vein flow pattern normal    . EVALUATION UNDER  ANESTHESIA WITH HEMORRHOIDECTOMY N/A 07/31/2019   Procedure: EXAM UNDER ANESTHESIA WITH RUBBER BAND LIGATION OF INTERNAL HEMORRHOIDS;  Surgeon: Darnell Level, MD;  Location: [facility];  Service: General;  Laterality: N/A;  . FISSURECTOMY N/A 07/31/2019   Procedure: FISSURECTOMY;  Surgeon: Darnell Level, MD;  Location: [facility];  Service: General;  Laterality: N/A;  . KNEE SURGERY    . LEFT HEART CATHETERIZATION WITH CORONARY ANGIOGRAM N/A 08/10/2013   Procedure: LEFT HEART CATHETERIZATION WITH CORONARY ANGIOGRAM;  Surgeon: Lennette Bihari, MD;  Location[facility];  Service: Cardiovascular;  Laterality: N/A;  . NM MYOCAR PERF WALL MOTION  02/19/2012   EF 61% ,NO SIGNIFICANT WALL MOTION ABNORMALITIES NOTED  . SPHINCTEROTOMY N/A 07/31/2019   Procedure: LATERAL INTERNAL SPHINCTEROTOMY;  Surgeon: Darnell Level, MD;  Location: [facility];  Service: General;  Laterality: N/A;  . VASECTOMY     Social History:  reports that he quit smoking about 47 years ago. He has never used smokeless tobacco. He reports that he does not drink alcohol and does not use drugs.  Allergies  Allergen Reactions  . Crestor [Rosuvastatin Calcium] Other (See Comments)    Joint pain, ankle pain    Family History  Problem Relation Age of Onset  . Hypertension Father   . Alcohol abuse Father   . Cirrhosis Father   . Stomach cancer Father   . Prostate cancer Neg Hx   . Colon cancer Neg Hx    Family history: Family history reviewed and not pertinent  Prior to Admission medications   Medication Sig Start Date End Date Taking? Authorizing Provider  aspirin EC 81 MG tablet Take 81 mg by mouth daily.    [provider]  atorvastatin (LIPITOR) 20 MG tablet TAKE 1 TABLET(20 MG) BY MOUTH DAILY 11/01/20   Lennette Bihari, MD  carvedilol (COREG) 25 MG tablet TAKE 1 TABLET(25 MG) BY MOUTH TWICE DAILY WITH A MEAL 11/01/20   Lennette Bihari, MD  hydrocortisone (ANUSOL-HC) 2.5 %  rectal cream Place 1 application rectally 2 (two) times daily. 04/10/19   Ardith Dark, MD  lidocaine (XYLOCAINE) 5 % ointment Apply 1 application topically 4 (four) times daily as needed. 12/12/18   Ardith Dark, MD  lisinopril (ZESTRIL) 40 MG tablet TAKE 1 TABLET(40 MG) BY MOUTH DAILY 11/07/20   Lennette Bihari, MD  nitroGLYCERIN (NITROSTAT) 0.4 MG SL tablet Place 1 tablet (0.4 mg total) under the tongue every 5 (five) minutes x 3 doses as needed for chest pain. 03/31/20   Azalee Course, PA  Omega-3 Fatty Acids (FISH OIL) 1000 MG CAPS Take 1 capsule (1,000 mg total) by mouth daily. 11/18/18   Azalee Course, PA  ondansetron (ZOFRAN) 4 MG tablet Take 1 tablet (4 mg total) by mouth every 8 (eight) hours as needed for nausea or vomiting. 12/19/20   Terressa Koyanagi, DO  oxyCODONE (OXY IR/ROXICODONE) 5 MG immediate release tablet Take 1-2 tablets (5-10 mg total) by mouth every 6 (six) hours as needed for moderate pain, severe pain or breakthrough pain. 07/31/19   Darnell Level, MD   Physical Exam: Vitals:   12/19/20 2339 12/20/20 0141 12/20/20 0245 12/20/20 0315  BP:  (!) 90/59 91/62 (!) 87/58  Pulse:  63 60 62  Resp:  (!) 22 20 (!) 22  Temp:      TempSrc:      SpO2:  94% 90% 93%  Weight: 108.9 kg     Height: 5\' 10"  (1.778 m)      Constitutional: appears older than chronological age, NAD, minimally uncomfortable, patient appears anxious Eyes: PERRL, lids and conjunctivae normal HENMT: No swelling of the face, mucous membranes are dry. Posterior pharynx clear of any exudate or lesions.  Hearing appropriate Neck: normal, supple, no masses, no thyromegaly Respiratory: + wheezing on bilateral lung lobes, decreased breath sounds in the right apical lung region, no crackles. Mild increased respiratory effort. Mild accessory muscle use.  Cardiovascular: bradycardic heart rate, no murmurs / rubs / gallops. No extremity edema. 2+ pedal pulses. No carotid bruits.  Abdomen: obese abdomen, no tenderness, no masses  palpated, no hepatosplenomegaly. Bowel sounds positive.  Musculoskeletal: no clubbing / cyanosis. No joint deformity upper and lower extremities. Good ROM, no contractures, no atrophy. Normal muscle tone.  Skin: no rashes, lesions, ulcers. No induration Neurologic: Sensation intact. Strength 5/5 in all 4.  Psychiatric:  Alert and oriented x 3.  Anxious mood  EKG: independently reviewed, showing sinus bradycardia, rate of 59, QTc 449.  Compared to previous EKG on 08/25/2013 which also showed sinus bradycardia.  Chest x-ray on Admission: I personally reviewed and I agree with radiologist reading as below.  DG Chest Portable 1 View  Result Date: 12/20/2020 CLINICAL DATA:  Hypoxia EXAM: PORTABLE CHEST 1 VIEW COMPARISON:  08/25/2013 FINDINGS: Lung volumes are small. A 3 cm rounded opacity is seen within the right apex, possibly representing a primary pulmonary mass. This is new from prior examination. There is peripheral opacification of the left mid lung zone which may represent a peripheral infiltrate or a pleural mass. This is new since prior examination. No pneumothorax  or pleural effusion. Cardiac size within normal limits. Pulmonary vascularity is normal. IMPRESSION: Right apical mass and left peripheral pulmonary infiltrate versus pleural mass, both new since prior examination. Dedicated contrast enhanced CT examination of the chest is recommended for further evaluation. Electronically Signed   By: Helyn Numbers MD   On: 12/20/2020 03:20   Labs on Admission: I have personally reviewed following labs  CBC: Recent Labs  Lab 12/19/20 2345  WBC 4.6  HGB 16.2  HCT 46.4  MCV 90.8  PLT 93*   Basic Metabolic Panel: Recent Labs  Lab 12/19/20 2345  NA 132*  K 4.4  CL 97*  CO2 25  GLUCOSE 139*  BUN 20  CREATININE 1.82*  CALCIUM 8.8*   Urine analysis:    Component Value Date/Time   COLORURINE YELLOW 08/07/2013 1127   APPEARANCEUR CLEAR 08/07/2013 1127   LABSPEC 1.021 08/07/2013 1127    PHURINE 7.0 08/07/2013 1127   GLUCOSEU NEGATIVE 08/07/2013 1127   HGBUR NEGATIVE 08/07/2013 1127   BILIRUBINUR NEGATIVE 08/07/2013 1127   KETONESUR NEGATIVE 08/07/2013 1127   PROTEINUR NEGATIVE 08/07/2013 1127   UROBILINOGEN 1.0 08/07/2013 1127   NITRITE NEGATIVE 08/07/2013 1127   LEUKOCYTESUR NEGATIVE 08/07/2013 1127   Miracle Mongillo N Clarene Curran D.O. [facility]  If 7PM-7AM, please contact overnight-coverage provider If 7AM-7PM, please contact day coverage provider [email or web address]  12/20/2020, 4:45 AM

## 2020-12-20 NOTE — ED Provider Notes (Signed)
[facility] EMERGENCY DEPARTMENT Provider Note   CSN: [id number] Arrival date & time: 12/19/20  2325     History Chief Complaint  Patient presents with  . Dehydration  . Nausea  . Vomiting    Mario Torres is a 70 y.o. male.  70 year old male with a history of hypertension, dyslipidemia, myocardial infarction presents to the emergency department for evaluation of altered mental status.  He has been feeling unwell for the past week.  Reports nausea, vomiting, diarrhea, and associated anorexia.  Has only had approximately 20 ounces of water to drink today.  Has not had any vomiting in the past 24 hours.  Reports 4-5 watery, nonbloody bowel movements.  He was prescribed an antiemetic by his PCP yesterday.  Complains of fatigue and malaise as well as shortness of breath.  Notes some lightheadedness today; may have had a near syncopal event while having a BM prior to arrival when wife reports he seemed more altered. Has positive COVID exposure around Christmas.  Has not been vaccinated against COVID-19.        Past Medical History:  Diagnosis Date  . Hyperlipidemia   . Hypertension   . MI (myocardial infarction) [facility])    2012    Patient Active Problem List   Diagnosis Date Noted  . Acute hypoxemic respiratory failure due to COVID-19 (HCC) 12/20/2020  . Anal fissure 07/31/2019  . Erectile dysfunction 12/02/2018  . Hemorrhoids 12/02/2018  . Pain of right great toe 12/02/2018  . GERD (gastroesophageal reflux disease) 08/09/2013  . CAD- s/p PCI w/ DES to circumflex, and RCA in 2012- patent stents on re-look cath 08/10/13 08/07/2013  . HTN (hypertension) 08/07/2013  . HLD (hyperlipidemia) 08/07/2013  . History of non-ST elevation myocardial infarction (NSTEMI) -2012 08/07/2013    Past Surgical History:  Procedure Laterality Date  . CARDIAC CATHETERIZATION  02/06/2011   normal LV; CAD 40% mid left  LADafter thsecond diagonal;90-95% culprit stenosis prox.circ  ;80-90% stenosis prox RCA.  Marland Kitchen CARDIAC CATHETERIZATION  11/24 /2003   normal cors  . CORONARY ANGIOPLASTY  02/06/2011   large dominant left circ sytem treated w/cutting balloon arthrotomy,stenting with 3.5 x 16 mm eerolimus Promus stent,postdilated to 4.0 mm  . CORONARY STENT PLACEMENT    . DOPPLER ECHOCARDIOGRAPHY  02/07/2011   EF 50 to 55%  mild -mod posterior wall hypokinesis,pulm vein flow pattern normal    . EVALUATION UNDER ANESTHESIA WITH HEMORRHOIDECTOMY N/A 07/31/2019   Procedure: EXAM UNDER ANESTHESIA WITH RUBBER BAND LIGATION OF INTERNAL HEMORRHOIDS;  Surgeon: Darnell Level, MD;  Location: [facility];  Service: General;  Laterality: N/A;  . FISSURECTOMY N/A 07/31/2019   Procedure: FISSURECTOMY;  Surgeon: Darnell Level, MD;  Location: [facility];  Service: General;  Laterality: N/A;  . KNEE SURGERY    . LEFT HEART CATHETERIZATION WITH CORONARY ANGIOGRAM N/A 08/10/2013   Procedure: LEFT HEART CATHETERIZATION WITH CORONARY ANGIOGRAM;  Surgeon: Lennette Bihari, MD;  Location: [facility] CATH LAB;  Service: Cardiovascular;  Laterality: N/A;  . NM MYOCAR PERF WALL MOTION  02/19/2012   EF 61% ,NO SIGNIFICANT WALL MOTION ABNORMALITIES NOTED  . SPHINCTEROTOMY N/A 07/31/2019   Procedure: LATERAL INTERNAL SPHINCTEROTOMY;  Surgeon: Darnell Level, MD;  Location: [facility];  Service: General;  Laterality: N/A;  . VASECTOMY         Family History  Problem Relation Age of Onset  . Hypertension Father   . Alcohol abuse Father   . Cirrhosis Father   .  Stomach cancer Father   . Prostate cancer Neg Hx   . Colon cancer Neg Hx     Social History   Tobacco Use  . Smoking status: Former Smoker    Quit date: 08/31/1973    Years since quitting: 47.3  . Smokeless tobacco: Never Used  Substance Use Topics  . Alcohol use: No    Alcohol/week: 0.0 standard drinks  . Drug use: No    Home Medications Prior to Admission medications   Medication Sig Start Date End  Date Taking? Authorizing Provider  aspirin EC 81 MG tablet Take 81 mg by mouth daily.    [provider]  atorvastatin (LIPITOR) 20 MG tablet TAKE 1 TABLET(20 MG) BY MOUTH DAILY 11/01/20   Troy Sine, MD  carvedilol (COREG) 25 MG tablet TAKE 1 TABLET(25 MG) BY MOUTH TWICE DAILY WITH A MEAL 11/01/20   Troy Sine, MD  hydrocortisone (ANUSOL-HC) 2.5 % rectal cream Place 1 application rectally 2 (two) times daily. 04/10/19   Vivi Barrack, MD  lidocaine (XYLOCAINE) 5 % ointment Apply 1 application topically 4 (four) times daily as needed. 12/12/18   Vivi Barrack, MD  lisinopril (ZESTRIL) 40 MG tablet TAKE 1 TABLET(40 MG) BY MOUTH DAILY 11/07/20   Troy Sine, MD  nitroGLYCERIN (NITROSTAT) 0.4 MG SL tablet Place 1 tablet (0.4 mg total) under the tongue every 5 (five) minutes x 3 doses as needed for chest pain. 03/31/20   [name], PA  Omega-3 Fatty Acids (FISH OIL) 1000 MG CAPS Take 1 capsule (1,000 mg total) by mouth daily. 11/18/18   [name], PA  ondansetron (ZOFRAN) 4 MG tablet Take 1 tablet (4 mg total) by mouth every 8 (eight) hours as needed for nausea or vomiting. 12/19/20   Lucretia Kern, DO  oxyCODONE (OXY IR/ROXICODONE) 5 MG immediate release tablet Take 1-2 tablets (5-10 mg total) by mouth every 6 (six) hours as needed for moderate pain, severe pain or breakthrough pain. 07/31/19   Armandina Gemma, MD    Allergies    Crestor [rosuvastatin calcium]  Review of Systems   Review of Systems  Ten systems reviewed and are negative for acute change, except as noted in the HPI.    Physical Exam Updated Vital Signs BP (!) 87/58   Pulse 62   Temp 99.3 F (37.4 C) (Oral)   Resp (!) 22   Ht 5\' 10"  (1.778 m)   Wt 108.9 kg   SpO2 93%   BMI 34.44 kg/m   Physical Exam Vitals and nursing note reviewed.  Constitutional:      General: He is not in acute distress.    Appearance: He is well-developed and well-nourished. He is ill-appearing. He is not diaphoretic.      Comments: Appears fatigued and unwell, but nontoxic.  HENT:     Head: Normocephalic and atraumatic.  Eyes:     General: No scleral icterus.    Extraocular Movements: EOM normal.     Conjunctiva/sclera: Conjunctivae normal.  Cardiovascular:     Rate and Rhythm: Normal rate and regular rhythm.     Pulses: Normal pulses.  Pulmonary:     Effort: Pulmonary effort is normal. No respiratory distress.     Breath sounds: No stridor. No wheezing or rales.     Comments: Respirations even and unlabored. Lungs CTAB. SpO2 87-90% on RA. Musculoskeletal:        General: Normal range of motion.     Cervical back: Normal range of  motion.  Skin:    General: Skin is warm and dry.     Coloration: Skin is not pale.     Findings: No erythema or rash.  Neurological:     Mental Status: He is alert and oriented to person, place, and time.     Coordination: Coordination normal.     Comments: A&Ox4 with GCS 15. Speech is goal oriented. Moving all extremities without issue.  Psychiatric:        Mood and Affect: Mood and affect normal.        Behavior: Behavior normal.     ED Results / Procedures / Treatments   Labs (all labs ordered are listed, but only abnormal results are displayed) Labs Reviewed  RESP PANEL BY RT-PCR (FLU A&B, COVID) ARPGX2 - Abnormal; Notable for the following components:      Result Value   SARS Coronavirus 2 by RT PCR POSITIVE (*)    All other components within normal limits  BASIC METABOLIC PANEL - Abnormal; Notable for the following components:   Sodium 132 (*)    Chloride 97 (*)    Glucose, Bld 139 (*)    Creatinine, Ser 1.82 (*)    Calcium 8.8 (*)    GFR, Estimated 40 (*)    All other components within normal limits  CBC - Abnormal; Notable for the following components:   Platelets 93 (*)    All other components within normal limits  CULTURE, BLOOD (ROUTINE X 2)  CULTURE, BLOOD (ROUTINE X 2)  URINALYSIS, ROUTINE W REFLEX MICROSCOPIC  LACTIC ACID, PLASMA  LACTIC  ACID, PLASMA  D-DIMER, QUANTITATIVE (NOT AT [facility])  PROCALCITONIN  LACTATE DEHYDROGENASE  FERRITIN  TRIGLYCERIDES  FIBRINOGEN  C-REACTIVE PROTEIN  CBG MONITORING, ED    EKG None  Radiology DG Chest Portable 1 View  Result Date: 12/20/2020 CLINICAL DATA:  Hypoxia EXAM: PORTABLE CHEST 1 VIEW COMPARISON:  08/25/2013 FINDINGS: Lung volumes are small. A 3 cm rounded opacity is seen within the right apex, possibly representing a primary pulmonary mass. This is new from prior examination. There is peripheral opacification of the left mid lung zone which may represent a peripheral infiltrate or a pleural mass. This is new since prior examination. No pneumothorax or pleural effusion. Cardiac size within normal limits. Pulmonary vascularity is normal. IMPRESSION: Right apical mass and left peripheral pulmonary infiltrate versus pleural mass, both new since prior examination. Dedicated contrast enhanced CT examination of the chest is recommended for further evaluation. Electronically Signed   By: Helyn Numbers MD   On: 12/20/2020 03:20    Procedures Procedures (including critical care time)  Medications Ordered in ED Medications  sodium chloride 0.9 % bolus 500 mL (0 mLs Intravenous Stopped 12/20/20 0329)  sodium chloride 0.9 % bolus 500 mL (500 mLs Intravenous New Bag/Given 12/20/20 0335)    ED Course  I have reviewed the triage vital signs and the nursing notes.  Pertinent labs & imaging results that were available during my care of the patient were reviewed by me and considered in my medical decision making (see chart for details).  Clinical Course as of 12/20/20 0358  Tue Dec 20, 2020  0301 Spoke with wife who states that he took his Carvedilol and Lisinopril around dinner and again 1 hour later; wanted to "double up" since he hasn't been taking it during the week because he wasn't feeling well.  Patient then felt like he needed to have a bowel movement tonight. Went to the bathroom to have  a BM and had some urgency of stool. Wife went to get him a change of clothes and, when she went to the bathroom, she found him leaning back on the toilet with eyes rolled back. Brought him to the ED for evaluation after this episode. [KH]  [phone] Poison Control contacted who advise supportive care with IVF; observation until 6 hours post last ingestion. [KH]    Clinical Course User Index [KH] Darylene Price   MDM Rules/Calculators/A&P                          70 year old male presents to the emergency department after a near syncopal versus syncopal event at home.  He has been experiencing nausea, vomiting, diarrhea over the past week.  Has been eating and drinking very little.  Does have signs of acute dehydration with elevation in his creatinine.  Has been borderline hypotensive since ED arrival.  After speaking with the patient's wife, it was also realized that he took his lisinopril and carvedilol medications twice tonight within an hour.  Thought it would be beneficial to take an "extra dose" because he had not been taking it at all during the week while he was feeling unwell.  This may be contributing to his hypotension as well as his borderline bradycardia.  May have precipitated his near syncopal/syncopal event as well.  Suspect his fatigue/malaise, vomiting and diarrhea to be related to his Covid positivity.  He does have borderline low oxygen saturations ranging from 87 to 90% on room air at rest.  He was placed on 2 L oxygen via nasal cannula for comfort.  Sats have improved to around 93%.  He is in no respiratory distress.  Plan for admission for continued monitoring of hypoxia, fluid hydration.  Case discussed with Dr. Sedalia Muta of [facility] who will admit.   Final Clinical Impression(s) / ED Diagnoses Final diagnoses:  COVID-19 virus infection  AKI (acute kidney injury) (HCC)  Dehydration  Acute respiratory failure with hypoxia [facility])    Rx / DC Orders ED Discharge Orders    None        Antony Madura, PA-C 12/20/20 0401    Shon Baton, MD 12/21/20 [phone]

## 2020-12-20 NOTE — Progress Notes (Signed)
  Echocardiogram 2D Echocardiogram has been performed.  Janalyn Harder 12/20/2020, 9:41 AM

## 2020-12-20 NOTE — ED Notes (Signed)
Lunch Tray Ordered @ 1034. 

## 2020-12-21 DIAGNOSIS — U071 COVID-19: Secondary | ICD-10-CM | POA: Diagnosis not present

## 2020-12-21 DIAGNOSIS — N179 Acute kidney failure, unspecified: Secondary | ICD-10-CM

## 2020-12-21 DIAGNOSIS — E871 Hypo-osmolality and hyponatremia: Secondary | ICD-10-CM

## 2020-12-21 DIAGNOSIS — R55 Syncope and collapse: Secondary | ICD-10-CM

## 2020-12-21 DIAGNOSIS — J9601 Acute respiratory failure with hypoxia: Secondary | ICD-10-CM

## 2020-12-21 LAB — COMPREHENSIVE METABOLIC PANEL
ALT: 29 U/L (ref 0–44)
AST: 55 U/L — ABNORMAL HIGH (ref 15–41)
Albumin: 3.3 g/dL — ABNORMAL LOW (ref 3.5–5.0)
Alkaline Phosphatase: 41 U/L (ref 38–126)
Anion gap: 14 (ref 5–15)
BUN: 38 mg/dL — ABNORMAL HIGH (ref 8–23)
CO2: 17 mmol/L — ABNORMAL LOW (ref 22–32)
Calcium: 9.1 mg/dL (ref 8.9–10.3)
Chloride: 102 mmol/L (ref 98–111)
Creatinine, Ser: 1.61 mg/dL — ABNORMAL HIGH (ref 0.61–1.24)
GFR, Estimated: 46 mL/min — ABNORMAL LOW (ref >60)
Glucose, Bld: 206 mg/dL — ABNORMAL HIGH (ref 70–99)
Potassium: 4.3 mmol/L (ref 3.5–5.1)
Sodium: 133 mmol/L — ABNORMAL LOW (ref 135–145)
Total Bilirubin: 0.9 mg/dL (ref 0.3–1.2)
Total Protein: 6.4 g/dL — ABNORMAL LOW (ref 6.5–8.1)

## 2020-12-21 LAB — CBC
HCT: 47.9 % (ref 39.0–52.0)
Hemoglobin: 16.6 g/dL (ref 13.0–17.0)
MCH: 31.4 pg (ref 26.0–34.0)
MCHC: 34.7 g/dL (ref 30.0–36.0)
MCV: 90.5 fL (ref 80.0–100.0)
Platelets: 101 10*3/uL — ABNORMAL LOW (ref 150–400)
RBC: 5.29 MIL/uL (ref 4.22–5.81)
RDW: 12.7 % (ref 11.5–15.5)
WBC: 5.5 10*3/uL (ref 4.0–10.5)
nRBC: 0 % (ref 0.0–0.2)

## 2020-12-21 LAB — C-REACTIVE PROTEIN: CRP: 2.3 mg/dL — ABNORMAL HIGH (ref <1.0)

## 2020-12-21 LAB — D-DIMER, QUANTITATIVE: D-Dimer, Quant: 0.71 ug/mL-FEU — ABNORMAL HIGH (ref 0.00–0.50)

## 2020-12-21 MED ORDER — METHYLPREDNISOLONE SODIUM SUCC 125 MG IJ SOLR
60.0000 mg | Freq: Two times a day (BID) | INTRAMUSCULAR | Status: DC
  Administered 2020-12-21 – 2020-12-22 (×2): 60 mg via INTRAVENOUS
  Filled 2020-12-21 (×2): qty 2

## 2020-12-21 MED ORDER — MELATONIN 5 MG PO TABS
10.0000 mg | ORAL_TABLET | Freq: Every evening | ORAL | Status: DC | PRN
  Administered 2020-12-21: 10 mg via ORAL
  Filled 2020-12-21 (×2): qty 2

## 2020-12-21 MED ORDER — SODIUM CHLORIDE 0.9 % IV SOLN: INTRAVENOUS | Status: AC

## 2020-12-21 MED ORDER — FAMOTIDINE 20 MG PO TABS
20.0000 mg | ORAL_TABLET | Freq: Every day | ORAL | Status: DC
  Administered 2020-12-21 – 2020-12-22 (×2): 20 mg via ORAL
  Filled 2020-12-21 (×2): qty 1

## 2020-12-21 NOTE — ED Notes (Signed)
2 west unable to take report, informed that pt has been assigned to floor for 3 hours.

## 2020-12-21 NOTE — Plan of Care (Signed)
  Problem: Education: Goal: Knowledge of risk factors and measures for prevention of condition will improve Outcome: Progressing   Problem: Coping: Goal: Psychosocial and spiritual needs will be supported Outcome: Progressing   Problem: Respiratory: Goal: Will maintain a patent airway Outcome: Progressing Goal: Complications related to the disease process, condition or treatment will be avoided or minimized Outcome: Progressing   

## 2020-12-21 NOTE — Progress Notes (Addendum)
PROGRESS NOTE  Joneen CarawayStanley E Raia ZOX:[id number] DOB: 1951-04-28 DOA: 12/19/2020  PCP: Ardith DarkParker, Caleb M, MD  Brief History/Interval Summary:  70 y.o. male with medical history significant for hypertension, history of anal fissure status post fissurectomy, lateral internal sphincterotomy, hemorrhoids status post ligation of internal hemorrhoid, hyperlipidemia, unvaccinated for COVID-19, presented to the emergency department for chief concerns of passing out on the toilet.  Reason for Visit: Syncope  Consultants: None  Procedures: Echocardiogram  Antibiotics: Anti-infectives (From admission, onward)   Start     Dose/Rate Route Frequency Ordered Stop   12/21/20 1000  azithromycin (ZITHROMAX) tablet 250 mg       "Followed by" Linked Group Details   250 mg Oral Daily 12/20/20 0758 12/25/20 0959   12/20/20 1000  azithromycin (ZITHROMAX) tablet 500 mg       "Followed by" Linked Group Details   500 mg Oral Daily 12/20/20 0758 12/20/20 1058   12/20/20 0800  cefTRIAXone (ROCEPHIN) 2 g in sodium chloride 0.9 % 100 mL IVPB        2 g 200 mL/hr over 30 Minutes Intravenous Every 24 hours 12/20/20 0758        Subjective/Interval History: Patient mentions that he is feeling better.  No dizziness or lightheadedness.  Has not really ambulated.  He does mention cough.  No chest pain per se.  Shortness of breath with exertion.    Assessment/Plan:  Acute Hypoxic Resp. Failure/Pneumonia due to COVID-19   Recent Labs  Lab 12/20/20 0350 12/20/20 0527 12/21/20 0859  DDIMER 0.74*  --  0.71*  FERRITIN 430*  --   --   CRP 5.8*  --  2.3*  ALT  --  27 29  PROCALCITON 0.37  --   --     Objective findings: Fever: Noted to be afebrile Oxygen requirements: Nasal cannula 2 L/min.  Saturating in the early 90s  COVID 19 Therapeutics: Antibacterials: Noted to be on antibacterials with ceftriaxone and azithromycin.  Procalcitonin was 0.37.  WBC was normal.  Will discontinue antibacterials. Remdesivir:  Not started on Remdesivir due to late presentation Steroids: Noted to be on prednisone.  Will change to Solu-Medrol. Diuretics: None Inhaled Steroids: None Actemra/Baricitinib: Not given yet PUD Prophylaxis: Initiate Pepcid DVT Prophylaxis:  Lovenox   From a respiratory standpoint patient seems to be stable.  He is requiring 2 L of oxygen.  Does have a cough.  Inflammatory markers were elevated but seem to be better this morning.  D-dimer only minimally elevated.  His procalcitonin was 0.37.  Normal WBC noted.  Will discontinue antibacterials.  Prone positioning as tolerated.  Incentive spirometry.  Mobilization.  Continue to monitor inflammatory markers and D-dimer.  Due to relatively stable disease we will hold off on offering Actemra or baricitinib for now.  The treatment plan and use of medications and known side effects were discussed with patient/family. Some of the medications used are based on case reports/anecdotal data.  All other medications being used in the management of COVID-19 based on limited study data.  Complete risks and long-term side effects are unknown, however in the best clinical judgment they seem to be of some benefit.  Patient/family wanted to proceed with treatment options provided.  Syncope Most likely secondary to hypovolemia from his GI symptoms which were most likely secondary to COVID-19.  He was also noted to have acute renal failure from the hypovolemia.  Patient hydrated.  Seems to be better.  Will need to be mobilized.  Echocardiogram showed normal systolic  function with grade 1 diastolic dysfunction.  No significant valvular abnormalities noted.  Do not anticipate any further work-up for the syncope.  Concern for lung mass On chest x-ray of the right apical mass is an hour left peripheral pulmonary infiltrate versus pleural mass was noted.  Not seen previously.  Patient without any known history of malignancy.  He will need CT imaging.  Will see if this can be  done in the hospital if the renal function gets back to normal.  This was discussed with the patient.  Acute kidney injury/hyponatremia Secondary to hypovolemia.  ACE inhibitor is on hold.  Continue with IV fluids.  Creatinine has improved to 1.61 today.  Monitor urine output.  Essential hypertension Monitor blood pressures.  Holding ACE inhibitor.Marland Kitchen  History of coronary artery disease status post drug-eluting stent Nuclear medicine stress test on 04/13/2020 was read as no ST segment deviation noted during stress, no T wave inversion noted during stress.  Arrhythmia during stress was none.  Arrhythmias during recovery was none.  There were no significant arrhythmias noted during the test.  ECG was interpretable and nonconclusive. Continue outpatient cardiac follow-up.  Continue beta-blocker statin and aspirin.  Dyslipidemia Continue statin.  Thrombocytopenia Likely due to acute illness.  No overt bleeding noted.  Slightly better today.  Continue to trend.  Obesity Estimated body mass index is 34.7 kg/m as calculated from the following:   Height as of this encounter: 5\' 9"  (1.753 m).   Weight as of this encounter: 106.6 kg.   DVT Prophylaxis: Lovenox Code Status: Limited code Family Communication: No family at bedside.  Discussed with patient Disposition Plan: Hopefully return home when improved.  Status is: Inpatient  Remains inpatient appropriate because:IV treatments appropriate due to intensity of illness or inability to take PO and Inpatient level of care appropriate due to severity of illness   Dispo: The patient is from: Home              Anticipated d/c is to: Home              Anticipated d/c date is: 2 days              Patient currently is not medically stable to d/c.     Medications:  Scheduled: . aspirin EC  81 mg Oral Daily  . atorvastatin  20 mg Oral Daily  . azithromycin  250 mg Oral Daily  . carvedilol  25 mg Oral BID WC  . enoxaparin (LOVENOX) injection   40 mg Subcutaneous Daily  . predniSONE  50 mg Oral Q breakfast   Continuous: . cefTRIAXone (ROCEPHIN)  IV 2 g (12/21/20 1020)   IRS:[id number]   Objective:  Vital Signs  Vitals:   12/21/20 0615 12/21/20 0941 12/21/20 1012 12/21/20 1049  BP: (!) 148/91 (!) 136/91 (!) 116/92 (!) 144/81  Pulse: 64 88 81 73  Resp: 15 (!) 29 19 20   Temp: 98.1 F (36.7 C)  (!) 97.4 F (36.3 C) 98.4 F (36.9 C)  TempSrc: Oral  Oral   SpO2: 92% 90% 92% 90%  Weight:      Height:        Intake/Output Summary (Last 24 hours) at 12/21/2020 1105 Last data filed at 12/21/2020 0093 Gross per 24 hour  Intake -  Output 650 ml  Net -650 ml   Filed Weights   12/19/20 2339 12/20/20 1123  Weight: 108.9 kg 106.6 kg    General appearance: Awake alert.  In no distress Resp:  Normal effort at rest.  Few crackles bilateral bases.  No wheezing or rhonchi Cardio: S1-S2 is normal regular.  No S3-S4.  No rubs murmurs or bruit GI: Abdomen is soft.  Nontender nondistended.  Bowel sounds are present normal.  No masses organomegaly Extremities: No edema.  Full range of motion of lower extremities. Neurologic: Alert and oriented x3.  No focal neurological deficits.    Lab Results:  Data Reviewed: I have personally reviewed following labs and imaging studies  CBC: Recent Labs  Lab 12/19/20 2345 12/20/20 0527 12/21/20 0859  WBC 4.6 4.4 5.5  NEUTROABS  --  2.7  --   HGB 16.2 15.3 16.6  HCT 46.4 43.2 47.9  MCV 90.8 91.7 90.5  PLT 93* 85* 101*    Basic Metabolic Panel: Recent Labs  Lab 12/19/20 2345 12/20/20 0527 12/21/20 0859  NA 132* 128* 133*  K 4.4 4.1 4.3  CL 97* 99 102  CO2 25 18* 17*  GLUCOSE 139* 150* 206*  BUN 20 26* 38*  CREATININE 1.82* 2.67* 1.61*  CALCIUM 8.8* 8.2* 9.1    GFR: Estimated Creatinine Clearance: 52.1 mL/min (A) (by C-G formula based on SCr of 1.61 mg/dL (H)).  Liver Function Tests: Recent Labs  Lab 12/20/20 0527 12/21/20 0859  AST 46* 55*  ALT 27 29  ALKPHOS 38  41  BILITOT 0.9 0.9  PROT 5.8* 6.4*  ALBUMIN 3.3* 3.3*     CBG: Recent Labs  Lab 12/20/20 0657  GLUCAP 136*    Lipid Profile: Recent Labs    12/20/20 0350  TRIG 118    Anemia Panel: Recent Labs    12/20/20 0350  FERRITIN 430*    Recent Results (from the past 240 hour(s))  Resp Panel by RT-PCR (Flu A&B, Covid) Nasopharyngeal Swab     Status: Abnormal   Collection Time: 12/19/20 11:53 PM   Specimen: Nasopharyngeal Swab; Nasopharyngeal(NP) swabs in vial transport medium  Result Value Ref Range Status   SARS Coronavirus 2 by RT PCR POSITIVE (A) NEGATIVE Final    Comment: RESULT CALLED TO, READ BACK BY AND VERIFIED WITH: M. Alonza Bogus 0150 12/20/2020 T. TYSOR (NOTE) SARS-CoV-2 target nucleic acids are DETECTED.  The SARS-CoV-2 RNA is generally detectable in upper respiratory specimens during the acute phase of infection. Positive results are indicative of the presence of the identified virus, but do not rule out bacterial infection or co-infection with other pathogens not detected by the test. Clinical correlation with patient history and other diagnostic information is necessary to determine patient infection status. The expected result is Negative.  Fact Sheet for Patients: BloggerCourse.com  Fact Sheet for Healthcare Providers: SeriousBroker.it  This test is not yet approved or cleared by the [place] FDA and  has been authorized for detection and/or diagnosis of SARS-CoV-2 by FDA under an Emergency Use Authorization (EUA).  This EUA will remain in effect (meaning this test  can be used) for the duration of  the COVID-19 declaration under Section 564(b)(1) of the Act, 21 U.S.C. section 360bbb-3(b)(1), unless the authorization is terminated or revoked sooner.     Influenza A by PCR NEGATIVE NEGATIVE Final   Influenza B by PCR NEGATIVE NEGATIVE Final    Comment: (NOTE) The Xpert Xpress  SARS-CoV-2/FLU/RSV plus assay is intended as an aid in the diagnosis of influenza from Nasopharyngeal swab specimens and should not be used as a sole basis for treatment. Nasal washings and aspirates are unacceptable for Xpert Xpress SARS-CoV-2/FLU/RSV testing.  Fact Sheet for Patients: BloggerCourse.com  Fact Sheet for Healthcare Providers: SeriousBroker.it  This test is not yet approved or cleared by the [place] FDA and has been authorized for detection and/or diagnosis of SARS-CoV-2 by FDA under an Emergency Use Authorization (EUA). This EUA will remain in effect (meaning this test can be used) for the duration of the COVID-19 declaration under Section 564(b)(1) of the Act, 21 U.S.C. section 360bbb-3(b)(1), unless the authorization is terminated or revoked.  Performed at [facility], [address], [place], [place] [address]   Blood Culture (routine x 2)     Status: None (Preliminary result)   Collection Time: 12/20/20  3:50 AM   Specimen: BLOOD  Result Value Ref Range Status   Specimen Description BLOOD RIGHT ARM  Final   Special Requests   Final    BOTTLES DRAWN AEROBIC AND ANAEROBIC Blood Culture adequate volume   Culture   Final    NO GROWTH 1 DAY Performed at [facility], [address], [place], [place] [address]    Report Status PENDING  Incomplete  Blood Culture (routine x 2)     Status: None (Preliminary result)   Collection Time: 12/20/20  5:35 AM   Specimen: BLOOD  Result Value Ref Range Status   Specimen Description BLOOD RIGHT HAND  Final   Special Requests   Final    BOTTLES DRAWN AEROBIC AND ANAEROBIC Blood Culture adequate volume   Culture   Final    NO GROWTH 1 DAY Performed at [facility], [address] [place], [place] [address]    Report Status PENDING  Incomplete      Radiology Studies: DG Chest Portable 1 View  Result Date: 12/20/2020 CLINICAL DATA:  Hypoxia  EXAM: PORTABLE CHEST 1 VIEW COMPARISON:  08/25/2013 FINDINGS: Lung volumes are small. A 3 cm rounded opacity is seen within the right apex, possibly representing a primary pulmonary mass. This is new from prior examination. There is peripheral opacification of the left mid lung zone which may represent a peripheral infiltrate or a pleural mass. This is new since prior examination. No pneumothorax or pleural effusion. Cardiac size within normal limits. Pulmonary vascularity is normal. IMPRESSION: Right apical mass and left peripheral pulmonary infiltrate versus pleural mass, both new since prior examination. Dedicated contrast enhanced CT examination of the chest is recommended for further evaluation. Electronically Signed   By: Helyn Numbers MD   On: 12/20/2020 03:20   ECHOCARDIOGRAM COMPLETE  Result Date: 12/20/2020    ECHOCARDIOGRAM REPORT   Patient Name:   Mario Torres [facility] Date of Exam: 12/20/2020 Medical Rec #:  [id number]        Height:       70.0 in Accession #:    [id number]       Weight:       240.0 lb Date of Birth:  [birth year]        BSA:          2.255 m Patient Age:    69 years         BP:           106/71 mmHg Patient Gender: M                HR:           63 bpm. Exam Location:  Inpatient Procedure: 2D Echo, 3D Echo, Cardiac Doppler and Color Doppler Indications:    R55 Syncope  History:        Patient has  prior history of Echocardiogram examinations, most                 recent 06/03/2018. Previous Myocardial Infarction and CAD,                 Signs/Symptoms:Syncope; Risk Factors:Hypertension. Covid                 positive.  Sonographer:    Sheralyn Boatman RDCS Referring Phys: [id number] AMY N COX  Sonographer Comments: Suboptimal parasternal window. IMPRESSIONS  1. Left ventricular ejection fraction, by estimation, is 60 to 65%. Left ventricular ejection fraction by 3D volume is 61 %. The left ventricle has normal function. The left ventricle has no regional wall motion abnormalities. There is moderate  asymmetric left ventricular hypertrophy of the basal-septal segment. Left ventricular diastolic parameters are consistent with Grade I diastolic dysfunction (impaired relaxation).  2. Right ventricular systolic function is normal. The right ventricular size is normal. There is normal pulmonary artery systolic pressure. The estimated right ventricular systolic pressure is 17.1 mmHg.  3. The mitral valve is grossly normal. Mild mitral valve regurgitation. No evidence of mitral stenosis.  4. The aortic valve is tricuspid. Aortic valve regurgitation is not visualized. No aortic stenosis is present.  5. The inferior vena cava is normal in size with greater than 50% respiratory variability, suggesting right atrial pressure of 3 mmHg. Comparison(s): No significant change from prior study. FINDINGS  Left Ventricle: Left ventricular ejection fraction, by estimation, is 60 to 65%. Left ventricular ejection fraction by 3D volume is 61 %. The left ventricle has normal function. The left ventricle has no regional wall motion abnormalities. The left ventricular internal cavity size was normal in size. There is moderate asymmetric left ventricular hypertrophy of the basal-septal segment. Left ventricular diastolic parameters are consistent with Grade I diastolic dysfunction (impaired relaxation). Right Ventricle: The right ventricular size is normal. No increase in right ventricular wall thickness. Right ventricular systolic function is normal. There is normal pulmonary artery systolic pressure. The tricuspid regurgitant velocity is 1.88 m/s, and  with an assumed right atrial pressure of 3 mmHg, the estimated right ventricular systolic pressure is 17.1 mmHg. Left Atrium: Left atrial size was normal in size. Right Atrium: Right atrial size was normal in size. Pericardium: Trivial pericardial effusion is present. Presence of pericardial fat pad. Mitral Valve: The mitral valve is grossly normal. Mild mitral valve regurgitation. No  evidence of mitral valve stenosis. Tricuspid Valve: The tricuspid valve is grossly normal. Tricuspid valve regurgitation is trivial. No evidence of tricuspid stenosis. Aortic Valve: The aortic valve is tricuspid. Aortic valve regurgitation is not visualized. No aortic stenosis is present. Pulmonic Valve: The pulmonic valve was grossly normal. Pulmonic valve regurgitation is not visualized. No evidence of pulmonic stenosis. Aorta: The aortic root and ascending aorta are structurally normal, with no evidence of dilitation. Venous: The inferior vena cava is normal in size with greater than 50% respiratory variability, suggesting right atrial pressure of 3 mmHg. IAS/Shunts: The atrial septum is grossly normal.  LEFT VENTRICLE PLAX 2D LVIDd:         4.30 cm         Diastology LVIDs:         2.65 cm         LV e' medial:    4.13 cm/s LV PW:         2.00 cm         LV E/e' medial:  14.2 LV IVS:  1.45 cm         LV e' lateral:   6.90 cm/s LVOT diam:     1.07 cm         LV E/e' lateral: 8.5 LV SV:         23 LV SV Index:   10 LVOT Area:     0.91 cm        3D Volume EF                                LV 3D EF:    Left                                             ventricular LV Volumes (MOD)                            ejection LV vol d, MOD    60.7 ml                    fraction by A2C:                                        3D volume LV vol d, MOD    67.1 ml                    is 61 %. A4C: LV vol s, MOD    18.1 ml A2C:                           3D Volume EF: LV vol s, MOD    23.4 ml       3D EF:        61 % A4C: LV SV MOD A2C:   42.6 ml LV SV MOD A4C:   67.1 ml LV SV MOD BP:    43.4 ml RIGHT VENTRICLE             IVC RV S prime:     12.40 cm/s  IVC diam: 2.10 cm TAPSE (M-mode): 2.2 cm LEFT ATRIUM             Index       RIGHT ATRIUM           Index LA diam:        4.50 cm 2.00 cm/m  RA Area:     11.70 cm LA Vol (A2C):   53.6 ml 23.76 ml/m RA Volume:   22.10 ml  9.80 ml/m LA Vol (A4C):   53.1 ml 23.54 ml/m LA  Biplane Vol: 53.4 ml 23.68 ml/m  AORTIC VALVE LVOT Vmax:   128.00 cm/s LVOT Vmean:  89.200 cm/s LVOT VTI:    0.257 m  AORTA Ao Root diam: 3.80 cm Ao Asc diam:  3.50 cm MITRAL VALVE               TRICUSPID VALVE MV Area (PHT): 2.99 cm    TR Peak grad:   14.1 mmHg MV Decel Time: 254 msec    TR Vmax:        188.00 cm/s MV E velocity: 58.70 cm/s MV A velocity: 55.70  cm/s  SHUNTS MV E/A ratio:  1.05        Systemic VTI:  0.26 m                            Systemic Diam: 1.07 cm Lennie OdorWesley O'Neal MD Electronically signed by Lennie OdorWesley O'Neal MD Signature Date/Time: 12/20/2020/11:00:48 AM    Final        LOS: 1 day   Osvaldo ShipperGokul Velvet Moomaw  [facility] Pager on [email or web address]  12/21/2020, 11:05 AM

## 2020-12-21 NOTE — ED Notes (Signed)
Pt disconnected to get up to bedside commode.

## 2020-12-21 NOTE — ED Notes (Addendum)
Patient found to be standing in room without oxygen on after using bedside commode in room. Patient assisted back into bed after all linens were changed and reconnected to the monitor. Patient noted to be dyspneic on exertion.

## 2020-12-22 DIAGNOSIS — U071 COVID-19: Secondary | ICD-10-CM | POA: Diagnosis not present

## 2020-12-22 DIAGNOSIS — J9601 Acute respiratory failure with hypoxia: Secondary | ICD-10-CM | POA: Diagnosis not present

## 2020-12-22 LAB — C-REACTIVE PROTEIN: CRP: 1.1 mg/dL — ABNORMAL HIGH (ref <1.0)

## 2020-12-22 LAB — COMPREHENSIVE METABOLIC PANEL
ALT: 36 U/L (ref 0–44)
AST: 77 U/L — ABNORMAL HIGH (ref 15–41)
Albumin: 3.2 g/dL — ABNORMAL LOW (ref 3.5–5.0)
Alkaline Phosphatase: 41 U/L (ref 38–126)
Anion gap: 11 (ref 5–15)
BUN: 30 mg/dL — ABNORMAL HIGH (ref 8–23)
CO2: 22 mmol/L (ref 22–32)
Calcium: 8.7 mg/dL — ABNORMAL LOW (ref 8.9–10.3)
Chloride: 104 mmol/L (ref 98–111)
Creatinine, Ser: 1.32 mg/dL — ABNORMAL HIGH (ref 0.61–1.24)
GFR, Estimated: 58 mL/min — ABNORMAL LOW (ref >60)
Glucose, Bld: 192 mg/dL — ABNORMAL HIGH (ref 70–99)
Potassium: 4.3 mmol/L (ref 3.5–5.1)
Sodium: 137 mmol/L (ref 135–145)
Total Bilirubin: 1 mg/dL (ref 0.3–1.2)
Total Protein: 6.1 g/dL — ABNORMAL LOW (ref 6.5–8.1)

## 2020-12-22 LAB — CBC
HCT: 44.8 % (ref 39.0–52.0)
Hemoglobin: 15.8 g/dL (ref 13.0–17.0)
MCH: 31.7 pg (ref 26.0–34.0)
MCHC: 35.3 g/dL (ref 30.0–36.0)
MCV: 90 fL (ref 80.0–100.0)
Platelets: 114 10*3/uL — ABNORMAL LOW (ref 150–400)
RBC: 4.98 MIL/uL (ref 4.22–5.81)
RDW: 12.8 % (ref 11.5–15.5)
WBC: 6.7 10*3/uL (ref 4.0–10.5)
nRBC: 0 % (ref 0.0–0.2)

## 2020-12-22 LAB — D-DIMER, QUANTITATIVE: D-Dimer, Quant: 0.56 ug/mL-FEU — ABNORMAL HIGH (ref 0.00–0.50)

## 2020-12-22 MED ORDER — BENZONATATE 100 MG PO CAPS
100.0000 mg | ORAL_CAPSULE | Freq: Three times a day (TID) | ORAL | Status: DC
  Administered 2020-12-22: 100 mg via ORAL
  Filled 2020-12-22: qty 1

## 2020-12-22 MED ORDER — PREDNISONE 20 MG PO TABS: ORAL_TABLET | ORAL | 0 refills | Status: AC

## 2020-12-22 MED ORDER — FAMOTIDINE 20 MG PO TABS: 20.0000 mg | ORAL_TABLET | Freq: Every day | ORAL | 0 refills | Status: AC

## 2020-12-22 MED ORDER — BENZONATATE 100 MG PO CAPS: 100.0000 mg | ORAL_CAPSULE | Freq: Three times a day (TID) | ORAL | 0 refills | Status: AC | PRN

## 2020-12-22 NOTE — Progress Notes (Addendum)
SATURATION QUALIFICATIONS:  Patient Saturations on Room Air at Rest = 92%  Patient Saturations on [employer] while Ambulating = 88%  Patient Saturations on 2 Liters of oxygen while Ambulating = 95%  While ambulating patient needed to be reminded to breath in and out of his nose, if not reminded he would hold his breath while walking.   Sena Hitch

## 2020-12-22 NOTE — Discharge Summary (Signed)
[facility]  Physician Discharge Summary   Patient ID: Mario Torres MRN: [id number] DOB/AGE: 25-Mar-1951 70 y.o.  Admit date: 12/19/2020 Discharge date: 12/22/2020  PCP: Ardith Dark, MD  DISCHARGE DIAGNOSES:  Acute respiratory failure with hypoxia Pneumonia due to COVID-19 Syncope secondary to hypovolemia Concern for lung mass requiring outpatient evaluation Acute kidney injury, improving Essential hypertension Coronary artery disease  RECOMMENDATIONS FOR OUTPATIENT FOLLOW UP: 1. Patient needs further evaluation of the lung abnormality noted on chest x-ray.  He will need a CT scan of his chest with contrast once his kidney function has improved    Home Health: None Equipment/Devices: Home oxygen  CODE STATUS: Limited resuscitation in the hospital  DISCHARGE CONDITION: fair  Diet recommendation: Heart healthy as before  INITIAL HISTORY: 69 y.o.malewith medical history significant forhypertension,history of anal fissure status post fissurectomy, lateral internal sphincterotomy, hemorrhoids status post ligation of internal hemorrhoid, hyperlipidemia, unvaccinated for COVID-19, presented to the emergency department for chief concerns of passing out on the toilet.  Consultations:  None  Procedures:  Echocardiogram, see below   HOSPITAL COURSE:   Acute Hypoxic Resp. Failure/Pneumonia due to COVID-19 Patient was noted to be dyspneic.  He had minimal oxygen requirements.  He was stable on 2 L of oxygen by nasal cannula.  Inflammatory markers were noted to be elevated.  Patient was not started on Remdesivir due to late presentation.  He was however given steroids.  He was also started on antibacterials which were discontinued.  Patient mentioned this morning that he was feeling better and wanted to go home.  He was ambulated.  Home oxygen will be ordered.  He will be prescribed a tapering doses of steroids.  Syncope Most likely secondary to hypovolemia from  his GI symptoms which were most likely secondary to COVID-19.  He was also noted to have acute renal failure from the hypovolemia.  Patient hydrated.  Did not have any further episodes of syncope. Echocardiogram showed normal systolic function with grade 1 diastolic dysfunction.  No significant valvular abnormalities noted.    Concern for lung mass On chest x-ray of the right apical mass is an hour left peripheral pulmonary infiltrate versus pleural mass was noted.  Not seen previously.  Patient without any known history of malignancy.  He will need CT imaging.    We will waiting on his renal function to get back to baseline.  However patient very keen on going home today.  He mentioned that he will proceed this with his primary care provider.  This was also discussed with his wife.    Acute kidney injury/hyponatremia Secondary to hypovolemia.  Improved with IV hydration.  Continue to hold ACE inhibitor.  Essential hypertension Holding ACE inhibitor.  History of coronary artery disease status post drug-eluting stent Nuclear medicine stress test on 04/13/2020 was read as no ST segment deviation noted during stress, no T wave inversion noted during stress. Arrhythmia during stress was none. Arrhythmias during recovery was none. There were no significant arrhythmias noted during the test. ECG was interpretable and nonconclusive. Continue outpatient cardiac follow-up.  Continue beta-blocker statin and aspirin.  Dyslipidemia Continue statin.  Thrombocytopenia Likely due to acute illness.  Stable.  Obesity Estimated body mass index is 34.7 kg/m as calculated from the following:   Height as of this encounter: 5\' 9"  (1.753 m).   Weight as of this encounter: 106.6 kg.  Overall patient has been stable.  Wants to go home today.  Medically seems to be stable for discharge.  Home oxygen has been ordered.  PERTINENT LABS:  The results of significant diagnostics from this hospitalization  (including imaging, microbiology, ancillary and laboratory) are listed below for reference.    Microbiology: Recent Results (from the past 240 hour(s))  Resp Panel by RT-PCR (Flu A&B, Covid) Nasopharyngeal Swab     Status: Abnormal   Collection Time: 12/19/20 11:53 PM   Specimen: Nasopharyngeal Swab; Nasopharyngeal(NP) swabs in vial transport medium  Result Value Ref Range Status   SARS Coronavirus 2 by RT PCR POSITIVE (A) NEGATIVE Final    Comment: RESULT CALLED TO, READ BACK BY AND VERIFIED WITH: M. CRICHTON,RN 0150 12/20/2020 T. TYSOR (NOTE) SARS-CoV-2 target nucleic acids are DETECTED.  The SARS-CoV-2 RNA is generally detectable in upper respiratory specimens during the acute phase of infection. Positive results are indicative of the presence of the identified virus, but do not rule out bacterial infection or co-infection with other pathogens not detected by the test. Clinical correlation with patient history and other diagnostic information is necessary to determine patient infection status. The expected result is Negative.  Fact Sheet for Patients: BloggerCourse.com  Fact Sheet for Healthcare Providers: SeriousBroker.it  This test is not yet approved or cleared by the [place] FDA and  has been authorized for detection and/or diagnosis of SARS-CoV-2 by FDA under an Emergency Use Authorization (EUA).  This EUA will remain in effect (meaning this test  can be used) for the duration of  the COVID-19 declaration under Section 564(b)(1) of the Act, 21 U.S.C. section 360bbb-3(b)(1), unless the authorization is terminated or revoked sooner.     Influenza A by PCR NEGATIVE NEGATIVE Final   Influenza B by PCR NEGATIVE NEGATIVE Final    Comment: (NOTE) The Xpert Xpress SARS-CoV-2/FLU/RSV plus assay is intended as an aid in the diagnosis of influenza from Nasopharyngeal swab specimens and should not be used as a sole basis for  treatment. Nasal washings and aspirates are unacceptable for Xpert Xpress SARS-CoV-2/FLU/RSV testing.  Fact Sheet for Patients: BloggerCourse.com  Fact Sheet for Healthcare Providers: SeriousBroker.it  This test is not yet approved or cleared by the [place] FDA and has been authorized for detection and/or diagnosis of SARS-CoV-2 by FDA under an Emergency Use Authorization (EUA). This EUA will remain in effect (meaning this test can be used) for the duration of the COVID-19 declaration under Section 564(b)(1) of the Act, 21 U.S.C. section 360bbb-3(b)(1), unless the authorization is terminated or revoked.  Performed at [facility], [address], [place], [place] [address]   Blood Culture (routine x 2)     Status: None (Preliminary result)   Collection Time: 12/20/20  3:50 AM   Specimen: BLOOD  Result Value Ref Range Status   Specimen Description BLOOD RIGHT ARM  Final   Special Requests   Final    BOTTLES DRAWN AEROBIC AND ANAEROBIC Blood Culture adequate volume   Culture   Final    NO GROWTH 2 DAYS Performed at [facility], [address], [place], [place] [address]    Report Status PENDING  Incomplete  Blood Culture (routine x 2)     Status: None (Preliminary result)   Collection Time: 12/20/20  5:35 AM   Specimen: BLOOD  Result Value Ref Range Status   Specimen Description BLOOD RIGHT HAND  Final   Special Requests   Final    BOTTLES DRAWN AEROBIC AND ANAEROBIC Blood Culture adequate volume   Culture   Final    NO GROWTH 2  DAYS Performed at [facility], [address] [place], [place] [address]    Report Status PENDING  Incomplete     Labs:  COVID-19 Labs  Recent Labs    12/20/20 0350 12/21/20 0859 12/22/20 0435  DDIMER 0.74* 0.71* 0.56*  FERRITIN 430*  --   --   LDH 278*  --   --   CRP 5.8* 2.3* 1.1*    Lab Results  Component Value Date   SARSCOV2NAA POSITIVE (A) 12/19/2020    SARSCOV2NAA NEGATIVE 07/28/2019      Basic Metabolic Panel: Recent Labs  Lab 12/19/20 2345 12/20/20 0527 12/21/20 0859 12/22/20 0435  NA 132* 128* 133* 137  K 4.4 4.1 4.3 4.3  CL 97* 99 102 104  CO2 25 18* 17* 22  GLUCOSE 139* 150* 206* 192*  BUN 20 26* 38* 30*  CREATININE 1.82* 2.67* 1.61* 1.32*  CALCIUM 8.8* 8.2* 9.1 8.7*   Liver Function Tests: Recent Labs  Lab 12/20/20 0527 12/21/20 0859 12/22/20 0435  AST 46* 55* 77*  ALT 27 29 36  ALKPHOS 38 41 41  BILITOT 0.9 0.9 1.0  PROT 5.8* 6.4* 6.1*  ALBUMIN 3.3* 3.3* 3.2*   CBC: Recent Labs  Lab 12/19/20 2345 12/20/20 0527 12/21/20 0859 12/22/20 0435  WBC 4.6 4.4 5.5 6.7  NEUTROABS  --  2.7  --   --   HGB 16.2 15.3 16.6 15.8  HCT 46.4 43.2 47.9 44.8  MCV 90.8 91.7 90.5 90.0  PLT 93* 85* 101* 114*    CBG: Recent Labs  Lab 12/20/20 0657  GLUCAP 136*     IMAGING STUDIES DG Chest Portable 1 View  Result Date: 12/20/2020 CLINICAL DATA:  Hypoxia EXAM: PORTABLE CHEST 1 VIEW COMPARISON:  08/25/2013 FINDINGS: Lung volumes are small. A 3 cm rounded opacity is seen within the right apex, possibly representing a primary pulmonary mass. This is new from prior examination. There is peripheral opacification of the left mid lung zone which may represent a peripheral infiltrate or a pleural mass. This is new since prior examination. No pneumothorax or pleural effusion. Cardiac size within normal limits. Pulmonary vascularity is normal. IMPRESSION: Right apical mass and left peripheral pulmonary infiltrate versus pleural mass, both new since prior examination. Dedicated contrast enhanced CT examination of the chest is recommended for further evaluation. Electronically Signed   By: Helyn Numbers MD   On: 12/20/2020 03:20   ECHOCARDIOGRAM COMPLETE  Result Date: 12/20/2020    ECHOCARDIOGRAM REPORT   Patient Name:   BERK PILOT [facility] Date of Exam: 12/20/2020 Medical Rec #:  [id number]        Height:       70.0 in Accession #:     [id number]       Weight:       240.0 lb Date of Birth:  Aug 27, 1951        BSA:          2.255 m Patient Age:    69 years         BP:           106/71 mmHg Patient Gender: M                HR:           63 bpm. Exam Location:  Inpatient Procedure: 2D Echo, 3D Echo, Cardiac Doppler and Color Doppler Indications:    R55 Syncope  History:        Patient has prior history of Echocardiogram  examinations, most                 recent 06/03/2018. Previous Myocardial Infarction and CAD,                 Signs/Symptoms:Syncope; Risk Factors:Hypertension. Covid                 positive.  Sonographer:    Sheralyn Boatmanina West RDCS Referring Phys: [id number] AMY N COX  Sonographer Comments: Suboptimal parasternal window. IMPRESSIONS  1. Left ventricular ejection fraction, by estimation, is 60 to 65%. Left ventricular ejection fraction by 3D volume is 61 %. The left ventricle has normal function. The left ventricle has no regional wall motion abnormalities. There is moderate asymmetric left ventricular hypertrophy of the basal-septal segment. Left ventricular diastolic parameters are consistent with Grade I diastolic dysfunction (impaired relaxation).  2. Right ventricular systolic function is normal. The right ventricular size is normal. There is normal pulmonary artery systolic pressure. The estimated right ventricular systolic pressure is 17.1 mmHg.  3. The mitral valve is grossly normal. Mild mitral valve regurgitation. No evidence of mitral stenosis.  4. The aortic valve is tricuspid. Aortic valve regurgitation is not visualized. No aortic stenosis is present.  5. The inferior vena cava is normal in size with greater than 50% respiratory variability, suggesting right atrial pressure of 3 mmHg. Comparison(s): No significant change from prior study. FINDINGS  Left Ventricle: Left ventricular ejection fraction, by estimation, is 60 to 65%. Left ventricular ejection fraction by 3D volume is 61 %. The left ventricle has normal function. The left  ventricle has no regional wall motion abnormalities. The left ventricular internal cavity size was normal in size. There is moderate asymmetric left ventricular hypertrophy of the basal-septal segment. Left ventricular diastolic parameters are consistent with Grade I diastolic dysfunction (impaired relaxation). Right Ventricle: The right ventricular size is normal. No increase in right ventricular wall thickness. Right ventricular systolic function is normal. There is normal pulmonary artery systolic pressure. The tricuspid regurgitant velocity is 1.88 m/s, and  with an assumed right atrial pressure of 3 mmHg, the estimated right ventricular systolic pressure is 17.1 mmHg. Left Atrium: Left atrial size was normal in size. Right Atrium: Right atrial size was normal in size. Pericardium: Trivial pericardial effusion is present. Presence of pericardial fat pad. Mitral Valve: The mitral valve is grossly normal. Mild mitral valve regurgitation. No evidence of mitral valve stenosis. Tricuspid Valve: The tricuspid valve is grossly normal. Tricuspid valve regurgitation is trivial. No evidence of tricuspid stenosis. Aortic Valve: The aortic valve is tricuspid. Aortic valve regurgitation is not visualized. No aortic stenosis is present. Pulmonic Valve: The pulmonic valve was grossly normal. Pulmonic valve regurgitation is not visualized. No evidence of pulmonic stenosis. Aorta: The aortic root and ascending aorta are structurally normal, with no evidence of dilitation. Venous: The inferior vena cava is normal in size with greater than 50% respiratory variability, suggesting right atrial pressure of 3 mmHg. IAS/Shunts: The atrial septum is grossly normal.  LEFT VENTRICLE PLAX 2D LVIDd:         4.30 cm         Diastology LVIDs:         2.65 cm         LV e' medial:    4.13 cm/s LV PW:         2.00 cm         LV E/e' medial:  14.2 LV IVS:  1.45 cm         LV e' lateral:   6.90 cm/s LVOT diam:     1.07 cm         LV E/e'  lateral: 8.5 LV SV:         23 LV SV Index:   10 LVOT Area:     0.91 cm        3D Volume EF                                LV 3D EF:    Left                                             ventricular LV Volumes (MOD)                            ejection LV vol d, MOD    60.7 ml                    fraction by A2C:                                        3D volume LV vol d, MOD    67.1 ml                    is 61 %. A4C: LV vol s, MOD    18.1 ml A2C:                           3D Volume EF: LV vol s, MOD    23.4 ml       3D EF:        61 % A4C: LV SV MOD A2C:   42.6 ml LV SV MOD A4C:   67.1 ml LV SV MOD BP:    43.4 ml RIGHT VENTRICLE             IVC RV S prime:     12.40 cm/s  IVC diam: 2.10 cm TAPSE (M-mode): 2.2 cm LEFT ATRIUM             Index       RIGHT ATRIUM           Index LA diam:        4.50 cm 2.00 cm/m  RA Area:     11.70 cm LA Vol (A2C):   53.6 ml 23.76 ml/m RA Volume:   22.10 ml  9.80 ml/m LA Vol (A4C):   53.1 ml 23.54 ml/m LA Biplane Vol: 53.4 ml 23.68 ml/m  AORTIC VALVE LVOT Vmax:   128.00 cm/s LVOT Vmean:  89.200 cm/s LVOT VTI:    0.257 m  AORTA Ao Root diam: 3.80 cm Ao Asc diam:  3.50 cm MITRAL VALVE               TRICUSPID VALVE MV Area (PHT): 2.99 cm    TR Peak grad:   14.1 mmHg MV Decel Time: 254 msec    TR Vmax:        188.00 cm/s MV E velocity: 58.70 cm/s MV A velocity: 55.70  cm/s  SHUNTS MV E/A ratio:  1.05        Systemic VTI:  0.26 m                            Systemic Diam: 1.07 cm Eleonore Chiquito MD Electronically signed by Eleonore Chiquito MD Signature Date/Time: 12/20/2020/11:00:48 AM    Final     DISCHARGE EXAMINATION: Vitals:   12/21/20 1049 12/21/20 1646 12/21/20 2020 12/22/20 0607  BP: (!) 144/81 (!) 145/92 140/85 (!) 148/96  Pulse: 73 70 72 79  Resp: 20 20 19 16   Temp: 98.4 F (36.9 C) 98 F (36.7 C) 98.5 F (36.9 C) 98.2 F (36.8 C)  TempSrc:   Oral   SpO2: 90% 93% 94% 92%  Weight:      Height:       General appearance: Awake alert.  In no distress Resp: Normal effort  at rest.  Few crackles at the bases bilaterally.  No wheezing or rhonchi. Cardio: S1-S2 is normal regular.  No S3-S4.  No rubs murmurs or bruit GI: Abdomen is soft.  Nontender nondistended.  Bowel sounds are present normal.  No masses organomegaly Extremities: No edema.  Full range of motion of lower extremities. Neurologic: Alert and oriented x3.  No focal neurological deficits.    DISPOSITION: Home  Discharge Instructions    Call MD for:  difficulty breathing, headache or visual disturbances   Complete by: As directed    Call MD for:  extreme fatigue   Complete by: As directed    Call MD for:  persistant dizziness or light-headedness   Complete by: As directed    Call MD for:  persistant nausea and vomiting   Complete by: As directed    Call MD for:  severe uncontrolled pain   Complete by: As directed    Call MD for:  temperature >100.4   Complete by: As directed    Diet - low sodium heart healthy   Complete by: As directed    Discharge instructions   Complete by: As directed    Please be sure to follow-up with your primary care provider in a week or so to discuss the abnormal chest x-ray findings with them.  You will need to have a CT scan of your chest once your kidney function has returned to normal.  Stay well-hydrated at home.  You will need to have blood work done at the next visit with your PCP to check your kidney function.  You were cared for by a hospitalist during your hospital stay. If you have any questions about your discharge medications or the care you received while you were in the hospital after you are discharged, you can call the unit and asked to speak with the hospitalist on call if the hospitalist that took care of you is not available. Once you are discharged, your primary care physician will handle any further medical issues. Please note that NO REFILLS for any discharge medications will be authorized once you are discharged, as it is imperative that you return  to your primary care physician (or establish a relationship with a primary care physician if you do not have one) for your aftercare needs so that they can reassess your need for medications and monitor your lab values. If you do not have a primary care physician, you can call [phone] for a physician referral.   Increase activity slowly   Complete by: As directed  Allergies as of 12/22/2020      Reactions   Crestor [rosuvastatin Calcium] Other (See Comments)   Joint pain, ankle pain      Medication List    STOP taking these medications   lisinopril 40 MG tablet Commonly known as: ZESTRIL     TAKE these medications   acetaminophen 500 MG tablet Commonly known as: TYLENOL Take 500 mg by mouth every 6 (six) hours as needed for mild pain.   aspirin EC 81 MG tablet Take 81 mg by mouth daily.   atorvastatin 20 MG tablet Commonly known as: LIPITOR TAKE 1 TABLET(20 MG) BY MOUTH DAILY What changed: See the new instructions.   benzonatate 100 MG capsule Commonly known as: TESSALON Take 1 capsule (100 mg total) by mouth 3 (three) times daily as needed for cough.   carvedilol 25 MG tablet Commonly known as: COREG TAKE 1 TABLET(25 MG) BY MOUTH TWICE DAILY WITH A MEAL What changed: See the new instructions.   famotidine 20 MG tablet Commonly known as: PEPCID Take 1 tablet (20 mg total) by mouth daily for 10 days. Start taking on: December 23, 2020   Fish Oil 1000 MG Caps Take 1 capsule (1,000 mg total) by mouth daily.   multivitamin tablet Take 1 tablet by mouth daily.   nitroGLYCERIN 0.4 MG SL tablet Commonly known as: NITROSTAT Place 1 tablet (0.4 mg total) under the tongue every 5 (five) minutes x 3 doses as needed for chest pain.   ondansetron 4 MG tablet Commonly known as: Zofran Take 1 tablet (4 mg total) by mouth every 8 (eight) hours as needed for nausea or vomiting.   predniSONE 20 MG tablet Commonly known as: DELTASONE Take 3 tablets once daily for 3  days followed by 2 tablets once daily for 3 days followed by 1 tablet once daily for 3 days and then stop            Durable Medical Equipment  (From admission, onward)         Start     Ordered   12/22/20 1015  For home use only DME oxygen  Once       Question Answer Comment  Length of Need 6 Months   Mode or (Route) Nasal cannula   Liters per Minute 2   Frequency Continuous (stationary and portable oxygen unit needed)   Oxygen conserving device Yes   Oxygen delivery system Gas      12/22/20 1014            Follow-up Information    Ardith DarkParker, Caleb M, MD. Schedule an appointment as soon as possible for a visit in 1 week(s).   Specialty: Family Medicine Contact information: [address] [place] [address] [phone]               TOTAL DISCHARGE TIME: 35 minutes  Otillia Cordone Rito EhrlichKrishnan  [facility] Pager on [email or web address]  12/22/2020, 2:14 PM

## 2020-12-22 NOTE — Discharge Instructions (Signed)
10 Things You Can Do to Manage Your COVID-19 Symptoms at Home If you have possible or confirmed COVID-19: 1. Stay home from work and school. And stay away from other public places. If you must go out, avoid using any kind of public transportation, ridesharing, or taxis. 2. Monitor your symptoms carefully. If your symptoms get worse, call your healthcare provider immediately. 3. Get rest and stay hydrated. 4. If you have a medical appointment, call the healthcare provider ahead of time and tell them that you have or may have COVID-19. 5. For medical emergencies, call 911 and notify the dispatch personnel that you have or may have COVID-19. 6. Cover your cough and sneezes with a tissue or use the inside of your elbow. 7. Wash your hands often with soap and water for at least 20 seconds or clean your hands with an alcohol-based hand sanitizer that contains at least 60% alcohol. 8. As much as possible, stay in a specific room and away from other people in your home. Also, you should use a separate bathroom, if available. If you need to be around other people in or outside of the home, wear a mask. 9. Avoid sharing personal items with other people in your household, like dishes, towels, and bedding. 10. Clean all surfaces that are touched often, like counters, tabletops, and doorknobs. Use household cleaning sprays or wipes according to the label instructions. cdc.gov/coronavirus 06/17/2019 This information is not intended to replace advice given to you by your health care provider. Make sure you discuss any questions you have with your health care provider. Document Revised: 11/19/2019 Document Reviewed: 11/19/2019 Elsevier Patient Education  2020 Elsevier Inc.  

## 2020-12-22 NOTE — TOC Progression Note (Signed)
Transition of Care [facility]) - Progression Note    Patient Details  Name: Mario Torres MRN: [id number] Date of Birth: [birth year]  Transition of Care [facility]) CM/SW Contact  Beckie Busing, RN Phone Number: [phone]  12/22/2020, 10:53 AM  Clinical Narrative:    [facility] consulted for Home O2 needs. Home O2 has been set up with [employer] . Portable tank to be delivered to the room and home delivery will be scheduled.          Expected Discharge Plan and Services                                                 Social Determinants of Health (SDOH) Interventions    Readmission Risk Interventions No flowsheet data found.

## 2020-12-23 ENCOUNTER — Encounter: Payer: Self-pay | Admitting: Internal Medicine

## 2020-12-23 ENCOUNTER — Telehealth: Payer: Self-pay

## 2020-12-23 ENCOUNTER — Telehealth (INDEPENDENT_AMBULATORY_CARE_PROVIDER_SITE_OTHER): Payer: Medicare Other | Admitting: Internal Medicine

## 2020-12-23 DIAGNOSIS — R918 Other nonspecific abnormal finding of lung field: Secondary | ICD-10-CM | POA: Insufficient documentation

## 2020-12-23 DIAGNOSIS — N179 Acute kidney failure, unspecified: Secondary | ICD-10-CM

## 2020-12-23 DIAGNOSIS — J9601 Acute respiratory failure with hypoxia: Secondary | ICD-10-CM

## 2020-12-23 DIAGNOSIS — U071 COVID-19: Secondary | ICD-10-CM | POA: Diagnosis not present

## 2020-12-23 MED ORDER — AZITHROMYCIN 250 MG PO TABS: ORAL_TABLET | ORAL | 0 refills | Status: AC

## 2020-12-23 NOTE — Assessment & Plan Note (Signed)
CXR was portable with several lesions. Will rx azithromycin and would recommend CT scan in 1-2 weeks if renal function stable. He is also taking prednisone. This was not present in 2014 which is most recent comparison.

## 2020-12-23 NOTE — Assessment & Plan Note (Signed)
Still using 2L oxygen continuous and counseled to continue this all the time until follow up with PCP. Use pulse oximeter to monitor and call office or return to ER for persistent readings in 80s. Continue prednisone and rx for azithromycin.

## 2020-12-23 NOTE — Telephone Encounter (Cosign Needed)
Transition Care Management Follow-up Telephone Call  Date of discharge and from where: [facility] 12/22/20  How have you been since you were released from the hospital? Been better but coming along ok  Any questions or concerns? No  Items Reviewed:  Did the pt receive and understand the discharge instructions provided? Yes   Medications obtained and verified? Yes   Other? No   Any new allergies since your discharge? No   Dietary orders reviewed? Yes  Do you have support at home? Yes   Home Care and Equipment/Supplies: Were home health services ordered? not applicable If so, what is the name of the agency?   Has the agency set up a time to come to the patient's home? not applicable Were any new equipment or medical supplies ordered?  Oxygen at home What is the name of the medical supply agency?  Were you able to get the supplies/equipment? yes Do you have any questions related to the use of the equipment or supplies? No  Functional Questionnaire: (I = Independent and D = Dependent) ADLs: I  Bathing/Dressing- I  Meal Prep- D  Eating- I  Maintaining continence- I  Transferring/Ambulation- I  Managing Meds- I  Follow up appointments reviewed:   PCP Hospital f/u appt confirmed? No  pt stated they will call on Monday to make follow up apt  Are transportation arrangements needed? No   If their condition worsens, is the pt aware to call PCP or go to the Emergency Dept.? Yes  Was the patient provided with contact information for the PCP's office or ED? Yes  Was to pt encouraged to call back with questions or concerns? Yes

## 2020-12-23 NOTE — Assessment & Plan Note (Signed)
Renal function appears to be back close to baseline on discharge. In 1-2 weeks with follow up to PCP would recommend repeat BMP and if stable can pursue CT lung with contrast as recommended.

## 2020-12-23 NOTE — Progress Notes (Signed)
Virtual Visit via Video Note  I connected with Mario Torres on 12/23/20 at 10:20 AM EST by a video enabled telemedicine application and verified that I am speaking with the correct person using two identifiers.  The patient and the provider were at separate locations throughout the entire encounter. Patient location: home, Provider location: work   I discussed the limitations of evaluation and management by telemedicine and the availability of in person appointments. The patient expressed understanding and agreed to proceed. The patient and the provider were the only parties present for the visit unless noted in HPI below.  History of Present Illness: The patient is a 70 y.o. man with visit for covid-19 positive and recent hospital stay with oxygen. He did request to leave hospital due to improvement on 12/22/20. D/C summary is not complete at the time of visit but indicates he had received IV steroids, at least 1 dose of IV antibiotics which were stopped during hospital stay sometime. He was discharged home with 2 L oxygen continuous. Started initially 12/12/20. Has not been vaccinated against covid-19 and was outside the window for monoclonal antibody or antiviral treatment during hospital stay. Denies fevers ongoing. Is still having SOB and cough. He is using the oxygen all the time. He did have some worsening SOB last night with lying flat so did not sleep well and had to be elevated. Overall he is feeling some mild improvement this morning. O2 levels staying in the 90s today. Has not been moving around much. Appetite poor but drinking fluids. They did fill prednisone and he is taking that as prescribed. Does not have inhaler at home. Has incentive spirometer and is using that also. There was a lung mass potentially on CXR in the hospital and he is advised to have CT scan once his AKI is resolved. Got fluids per patient in the hospital. Wife is present for visit and helps to provide history. She is  slightly confused about what happened in the hospital and is hoping that I can provide her with this information.  PMH, [facility], social history reviewed and updated if appropriate.   Observations/Objective: Appearance: looks sick, with oxygen in place, speaking in full sentences, some coughing during visit, breathing appears adequate and no tripoding or accessory muscle usage, casual grooming, abdomen does not appear distended, throat not visualized, mental status is A and O times 3  Assessment and Plan: See problem oriented charting  Follow Up Instructions: continue prednisone as prescribed, rx azithromycin course given lung findings some concern for bacterial process also along with covid-19, advised follow up with pcp in 1-2 weeks for assessment of oxygen needs as well as repeat labs and CT lung if kidney function stable  Visit time 25 minutes in face to face communication with patient and coordination of care, additional 15 minutes spent in record review, coordination or care, ordering tests, communicating/referring to other healthcare professionals, documenting in medical records all on the same day of the visit for total time 40 minutes spent on the visit.   I discussed the assessment and treatment plan with the patient. The patient was provided an opportunity to ask questions and all were answered. The patient agreed with the plan and demonstrated an understanding of the instructions.   The patient was advised to call back or seek an in-person evaluation if the symptoms worsen or if the condition fails to improve as anticipated.  Myrlene Broker, MD

## 2020-12-24 ENCOUNTER — Other Ambulatory Visit: Payer: Self-pay

## 2020-12-24 ENCOUNTER — Emergency Department (HOSPITAL_COMMUNITY): Payer: Medicare Other

## 2020-12-24 ENCOUNTER — Inpatient Hospital Stay (HOSPITAL_COMMUNITY)
Admission: EM | Admit: 2020-12-24 | Discharge: 2021-01-17 | DRG: 004 | Disposition: E | Payer: Medicare Other | Attending: Internal Medicine | Admitting: Internal Medicine

## 2020-12-24 DIAGNOSIS — I452 Bifascicular block: Secondary | ICD-10-CM | POA: Diagnosis present

## 2020-12-24 DIAGNOSIS — A4189 Other specified sepsis: Secondary | ICD-10-CM | POA: Diagnosis not present

## 2020-12-24 DIAGNOSIS — Y95 Nosocomial condition: Secondary | ICD-10-CM | POA: Diagnosis present

## 2020-12-24 DIAGNOSIS — G9341 Metabolic encephalopathy: Secondary | ICD-10-CM | POA: Diagnosis not present

## 2020-12-24 DIAGNOSIS — J158 Pneumonia due to other specified bacteria: Secondary | ICD-10-CM | POA: Diagnosis not present

## 2020-12-24 DIAGNOSIS — E785 Hyperlipidemia, unspecified: Secondary | ICD-10-CM | POA: Diagnosis present

## 2020-12-24 DIAGNOSIS — R195 Other fecal abnormalities: Secondary | ICD-10-CM | POA: Diagnosis present

## 2020-12-24 DIAGNOSIS — N179 Acute kidney failure, unspecified: Secondary | ICD-10-CM | POA: Diagnosis present

## 2020-12-24 DIAGNOSIS — Z955 Presence of coronary angioplasty implant and graft: Secondary | ICD-10-CM

## 2020-12-24 DIAGNOSIS — Z6834 Body mass index (BMI) 34.0-34.9, adult: Secondary | ICD-10-CM

## 2020-12-24 DIAGNOSIS — Z9911 Dependence on respirator [ventilator] status: Secondary | ICD-10-CM

## 2020-12-24 DIAGNOSIS — Z452 Encounter for adjustment and management of vascular access device: Secondary | ICD-10-CM

## 2020-12-24 DIAGNOSIS — E870 Hyperosmolality and hypernatremia: Secondary | ICD-10-CM | POA: Diagnosis not present

## 2020-12-24 DIAGNOSIS — E669 Obesity, unspecified: Secondary | ICD-10-CM | POA: Diagnosis present

## 2020-12-24 DIAGNOSIS — J80 Acute respiratory distress syndrome: Secondary | ICD-10-CM | POA: Diagnosis present

## 2020-12-24 DIAGNOSIS — Z0189 Encounter for other specified special examinations: Secondary | ICD-10-CM

## 2020-12-24 DIAGNOSIS — E875 Hyperkalemia: Secondary | ICD-10-CM | POA: Diagnosis not present

## 2020-12-24 DIAGNOSIS — I10 Essential (primary) hypertension: Secondary | ICD-10-CM | POA: Diagnosis present

## 2020-12-24 DIAGNOSIS — Z8249 Family history of ischemic heart disease and other diseases of the circulatory system: Secondary | ICD-10-CM

## 2020-12-24 DIAGNOSIS — I2510 Atherosclerotic heart disease of native coronary artery without angina pectoris: Secondary | ICD-10-CM | POA: Diagnosis present

## 2020-12-24 DIAGNOSIS — J1282 Pneumonia due to coronavirus disease 2019: Secondary | ICD-10-CM | POA: Diagnosis present

## 2020-12-24 DIAGNOSIS — J9621 Acute and chronic respiratory failure with hypoxia: Secondary | ICD-10-CM

## 2020-12-24 DIAGNOSIS — N2889 Other specified disorders of kidney and ureter: Secondary | ICD-10-CM | POA: Diagnosis present

## 2020-12-24 DIAGNOSIS — I252 Old myocardial infarction: Secondary | ICD-10-CM

## 2020-12-24 DIAGNOSIS — Z515 Encounter for palliative care: Secondary | ICD-10-CM

## 2020-12-24 DIAGNOSIS — R197 Diarrhea, unspecified: Secondary | ICD-10-CM | POA: Diagnosis present

## 2020-12-24 DIAGNOSIS — G9340 Encephalopathy, unspecified: Secondary | ICD-10-CM

## 2020-12-24 DIAGNOSIS — I248 Other forms of acute ischemic heart disease: Secondary | ICD-10-CM | POA: Diagnosis present

## 2020-12-24 DIAGNOSIS — T380X5A Adverse effect of glucocorticoids and synthetic analogues, initial encounter: Secondary | ICD-10-CM | POA: Diagnosis present

## 2020-12-24 DIAGNOSIS — J9601 Acute respiratory failure with hypoxia: Secondary | ICD-10-CM | POA: Diagnosis not present

## 2020-12-24 DIAGNOSIS — U071 COVID-19: Secondary | ICD-10-CM | POA: Diagnosis present

## 2020-12-24 DIAGNOSIS — E1165 Type 2 diabetes mellitus with hyperglycemia: Secondary | ICD-10-CM | POA: Diagnosis not present

## 2020-12-24 DIAGNOSIS — A419 Sepsis, unspecified organism: Secondary | ICD-10-CM

## 2020-12-24 DIAGNOSIS — Z7982 Long term (current) use of aspirin: Secondary | ICD-10-CM

## 2020-12-24 DIAGNOSIS — R778 Other specified abnormalities of plasma proteins: Secondary | ICD-10-CM | POA: Diagnosis present

## 2020-12-24 DIAGNOSIS — Z888 Allergy status to other drugs, medicaments and biological substances status: Secondary | ICD-10-CM

## 2020-12-24 DIAGNOSIS — R6521 Severe sepsis with septic shock: Secondary | ICD-10-CM | POA: Diagnosis present

## 2020-12-24 DIAGNOSIS — D696 Thrombocytopenia, unspecified: Secondary | ICD-10-CM | POA: Diagnosis present

## 2020-12-24 DIAGNOSIS — Z87891 Personal history of nicotine dependence: Secondary | ICD-10-CM

## 2020-12-24 DIAGNOSIS — J939 Pneumothorax, unspecified: Secondary | ICD-10-CM

## 2020-12-24 DIAGNOSIS — Z79899 Other long term (current) drug therapy: Secondary | ICD-10-CM

## 2020-12-24 DIAGNOSIS — Z978 Presence of other specified devices: Secondary | ICD-10-CM

## 2020-12-24 DIAGNOSIS — R918 Other nonspecific abnormal finding of lung field: Secondary | ICD-10-CM | POA: Diagnosis present

## 2020-12-24 DIAGNOSIS — Z66 Do not resuscitate: Secondary | ICD-10-CM | POA: Diagnosis not present

## 2020-12-24 DIAGNOSIS — J9600 Acute respiratory failure, unspecified whether with hypoxia or hypercapnia: Secondary | ICD-10-CM

## 2020-12-24 DIAGNOSIS — K819 Cholecystitis, unspecified: Secondary | ICD-10-CM | POA: Diagnosis present

## 2020-12-24 DIAGNOSIS — R7989 Other specified abnormal findings of blood chemistry: Secondary | ICD-10-CM | POA: Diagnosis present

## 2020-12-24 DIAGNOSIS — I700 Atherosclerosis of aorta: Secondary | ICD-10-CM | POA: Diagnosis present

## 2020-12-24 DIAGNOSIS — Z800 Family history of malignant neoplasm of digestive organs: Secondary | ICD-10-CM

## 2020-12-24 LAB — BASIC METABOLIC PANEL
Anion gap: 11 (ref 5–15)
BUN: 18 mg/dL (ref 8–23)
CO2: 24 mmol/L (ref 22–32)
Calcium: 9.2 mg/dL (ref 8.9–10.3)
Chloride: 101 mmol/L (ref 98–111)
Creatinine, Ser: 1.14 mg/dL (ref 0.61–1.24)
GFR, Estimated: >60 mL/min (ref >60)
Glucose, Bld: 162 mg/dL — ABNORMAL HIGH (ref 70–99)
Potassium: 4.4 mmol/L (ref 3.5–5.1)
Sodium: 136 mmol/L (ref 135–145)

## 2020-12-24 LAB — CBC
HCT: 48.4 % (ref 39.0–52.0)
Hemoglobin: 16.3 g/dL (ref 13.0–17.0)
MCH: 30.5 pg (ref 26.0–34.0)
MCHC: 33.7 g/dL (ref 30.0–36.0)
MCV: 90.5 fL (ref 80.0–100.0)
Platelets: 168 10*3/uL (ref 150–400)
RBC: 5.35 MIL/uL (ref 4.22–5.81)
RDW: 12.3 % (ref 11.5–15.5)
WBC: 8.8 10*3/uL (ref 4.0–10.5)
nRBC: 0 % (ref 0.0–0.2)

## 2020-12-24 MED ORDER — ALBUTEROL SULFATE HFA 108 (90 BASE) MCG/ACT IN AERS
2.0000 | INHALATION_SPRAY | RESPIRATORY_TRACT | Status: DC | PRN
  Filled 2020-12-24: qty 6.7

## 2020-12-24 NOTE — ED Triage Notes (Addendum)
Pt presents to ED POV. Pt c/o SOB that began tonight. Pt d/c for covid on thurs. Per ED tech pt O2 sat 76% on RA initially. Pt in triage on RA 89%. Pt prescribed 3L O2 after d/c. Pt 90% on 3L. 92% on 4L. resp tachypnec in triage

## 2020-12-25 ENCOUNTER — Emergency Department (HOSPITAL_COMMUNITY): Payer: Medicare Other

## 2020-12-25 DIAGNOSIS — I248 Other forms of acute ischemic heart disease: Secondary | ICD-10-CM | POA: Diagnosis present

## 2020-12-25 DIAGNOSIS — G9340 Encephalopathy, unspecified: Secondary | ICD-10-CM | POA: Diagnosis not present

## 2020-12-25 DIAGNOSIS — R918 Other nonspecific abnormal finding of lung field: Secondary | ICD-10-CM

## 2020-12-25 DIAGNOSIS — I2510 Atherosclerotic heart disease of native coronary artery without angina pectoris: Secondary | ICD-10-CM | POA: Diagnosis not present

## 2020-12-25 DIAGNOSIS — I10 Essential (primary) hypertension: Secondary | ICD-10-CM | POA: Diagnosis not present

## 2020-12-25 DIAGNOSIS — Y95 Nosocomial condition: Secondary | ICD-10-CM | POA: Diagnosis present

## 2020-12-25 DIAGNOSIS — Z6834 Body mass index (BMI) 34.0-34.9, adult: Secondary | ICD-10-CM | POA: Diagnosis not present

## 2020-12-25 DIAGNOSIS — J80 Acute respiratory distress syndrome: Secondary | ICD-10-CM | POA: Diagnosis present

## 2020-12-25 DIAGNOSIS — R6521 Severe sepsis with septic shock: Secondary | ICD-10-CM | POA: Diagnosis present

## 2020-12-25 DIAGNOSIS — R195 Other fecal abnormalities: Secondary | ICD-10-CM | POA: Diagnosis present

## 2020-12-25 DIAGNOSIS — Z515 Encounter for palliative care: Secondary | ICD-10-CM | POA: Diagnosis not present

## 2020-12-25 DIAGNOSIS — N179 Acute kidney failure, unspecified: Secondary | ICD-10-CM | POA: Diagnosis present

## 2020-12-25 DIAGNOSIS — R4182 Altered mental status, unspecified: Secondary | ICD-10-CM | POA: Diagnosis not present

## 2020-12-25 DIAGNOSIS — J9601 Acute respiratory failure with hypoxia: Secondary | ICD-10-CM | POA: Diagnosis present

## 2020-12-25 DIAGNOSIS — E782 Mixed hyperlipidemia: Secondary | ICD-10-CM | POA: Diagnosis not present

## 2020-12-25 DIAGNOSIS — E875 Hyperkalemia: Secondary | ICD-10-CM | POA: Diagnosis not present

## 2020-12-25 DIAGNOSIS — E870 Hyperosmolality and hypernatremia: Secondary | ICD-10-CM | POA: Diagnosis not present

## 2020-12-25 DIAGNOSIS — R197 Diarrhea, unspecified: Secondary | ICD-10-CM | POA: Diagnosis present

## 2020-12-25 DIAGNOSIS — N2889 Other specified disorders of kidney and ureter: Secondary | ICD-10-CM | POA: Diagnosis present

## 2020-12-25 DIAGNOSIS — Z66 Do not resuscitate: Secondary | ICD-10-CM | POA: Diagnosis not present

## 2020-12-25 DIAGNOSIS — R7989 Other specified abnormal findings of blood chemistry: Secondary | ICD-10-CM | POA: Diagnosis not present

## 2020-12-25 DIAGNOSIS — J1282 Pneumonia due to coronavirus disease 2019: Secondary | ICD-10-CM | POA: Diagnosis present

## 2020-12-25 DIAGNOSIS — U071 COVID-19: Secondary | ICD-10-CM | POA: Diagnosis present

## 2020-12-25 DIAGNOSIS — I700 Atherosclerosis of aorta: Secondary | ICD-10-CM | POA: Diagnosis not present

## 2020-12-25 DIAGNOSIS — J158 Pneumonia due to other specified bacteria: Secondary | ICD-10-CM | POA: Diagnosis not present

## 2020-12-25 DIAGNOSIS — E1165 Type 2 diabetes mellitus with hyperglycemia: Secondary | ICD-10-CM | POA: Diagnosis not present

## 2020-12-25 DIAGNOSIS — R778 Other specified abnormalities of plasma proteins: Secondary | ICD-10-CM | POA: Diagnosis present

## 2020-12-25 DIAGNOSIS — I452 Bifascicular block: Secondary | ICD-10-CM | POA: Diagnosis present

## 2020-12-25 DIAGNOSIS — D696 Thrombocytopenia, unspecified: Secondary | ICD-10-CM | POA: Diagnosis present

## 2020-12-25 DIAGNOSIS — G9341 Metabolic encephalopathy: Secondary | ICD-10-CM | POA: Diagnosis not present

## 2020-12-25 DIAGNOSIS — A4189 Other specified sepsis: Secondary | ICD-10-CM | POA: Diagnosis present

## 2020-12-25 DIAGNOSIS — E669 Obesity, unspecified: Secondary | ICD-10-CM | POA: Diagnosis present

## 2020-12-25 DIAGNOSIS — K819 Cholecystitis, unspecified: Secondary | ICD-10-CM | POA: Diagnosis present

## 2020-12-25 LAB — CULTURE, BLOOD (ROUTINE X 2)
Culture: NO GROWTH
Culture: NO GROWTH
Special Requests: ADEQUATE
Special Requests: ADEQUATE

## 2020-12-25 LAB — D-DIMER, QUANTITATIVE: D-Dimer, Quant: 5.17 ug/mL-FEU — ABNORMAL HIGH (ref 0.00–0.50)

## 2020-12-25 LAB — C-REACTIVE PROTEIN: CRP: 12 mg/dL — ABNORMAL HIGH (ref <1.0)

## 2020-12-25 LAB — HEPATIC FUNCTION PANEL
ALT: 32 U/L (ref 0–44)
AST: 47 U/L — ABNORMAL HIGH (ref 15–41)
Albumin: 2.8 g/dL — ABNORMAL LOW (ref 3.5–5.0)
Alkaline Phosphatase: 47 U/L (ref 38–126)
Bilirubin, Direct: 0.4 mg/dL — ABNORMAL HIGH (ref 0.0–0.2)
Indirect Bilirubin: 1.1 mg/dL — ABNORMAL HIGH (ref 0.3–0.9)
Total Bilirubin: 1.5 mg/dL — ABNORMAL HIGH (ref 0.3–1.2)
Total Protein: 6.4 g/dL — ABNORMAL LOW (ref 6.5–8.1)

## 2020-12-25 LAB — TROPONIN I (HIGH SENSITIVITY): Troponin I (High Sensitivity): 16 ng/L (ref <18)

## 2020-12-25 LAB — PROCALCITONIN: Procalcitonin: 0.34 ng/mL

## 2020-12-25 MED ORDER — ACETAMINOPHEN 325 MG PO TABS: 650.0000 mg | ORAL_TABLET | Freq: Four times a day (QID) | ORAL | Status: DC | PRN

## 2020-12-25 MED ORDER — TOCILIZUMAB 400 MG/20ML IV SOLN
800.0000 mg | Freq: Once | INTRAVENOUS | Status: AC
  Administered 2020-12-25: 800 mg via INTRAVENOUS
  Filled 2020-12-25: qty 40

## 2020-12-25 MED ORDER — ASCORBIC ACID 500 MG PO TABS
500.0000 mg | ORAL_TABLET | Freq: Every day | ORAL | Status: DC
  Administered 2020-12-25 – 2020-12-26 (×2): 500 mg via ORAL
  Filled 2020-12-25 (×3): qty 1

## 2020-12-25 MED ORDER — ATORVASTATIN CALCIUM 10 MG PO TABS
20.0000 mg | ORAL_TABLET | Freq: Every day | ORAL | Status: DC
  Administered 2020-12-25 – 2020-12-26 (×2): 20 mg via ORAL
  Filled 2020-12-25 (×4): qty 2

## 2020-12-25 MED ORDER — ENOXAPARIN SODIUM 40 MG/0.4ML ~~LOC~~ SOLN
40.0000 mg | SUBCUTANEOUS | Status: DC
  Administered 2020-12-25 – 2020-12-26 (×2): 40 mg via SUBCUTANEOUS
  Filled 2020-12-25 (×2): qty 0.4

## 2020-12-25 MED ORDER — SODIUM CHLORIDE 0.9 % IV SOLN
200.0000 mg | Freq: Once | INTRAVENOUS | Status: AC
  Administered 2020-12-25: 200 mg via INTRAVENOUS
  Filled 2020-12-25: qty 40

## 2020-12-25 MED ORDER — BENZONATATE 100 MG PO CAPS: 100.0000 mg | ORAL_CAPSULE | Freq: Three times a day (TID) | ORAL | Status: DC | PRN

## 2020-12-25 MED ORDER — IOHEXOL 350 MG/ML SOLN
100.0000 mL | Freq: Once | INTRAVENOUS | Status: AC | PRN
  Administered 2020-12-25: 100 mL via INTRAVENOUS

## 2020-12-25 MED ORDER — AZITHROMYCIN 250 MG PO TABS
250.0000 mg | ORAL_TABLET | Freq: Every day | ORAL | Status: DC
  Administered 2020-12-25 – 2020-12-26 (×2): 250 mg via ORAL
  Filled 2020-12-25 (×3): qty 1

## 2020-12-25 MED ORDER — ONDANSETRON HCL 4 MG/2ML IJ SOLN: 4.0000 mg | Freq: Four times a day (QID) | INTRAMUSCULAR | Status: DC | PRN

## 2020-12-25 MED ORDER — ONDANSETRON HCL 4 MG PO TABS: 4.0000 mg | ORAL_TABLET | Freq: Four times a day (QID) | ORAL | Status: DC | PRN

## 2020-12-25 MED ORDER — HYDROCOD POLST-CPM POLST ER 10-8 MG/5ML PO SUER: 5.0000 mL | Freq: Two times a day (BID) | ORAL | Status: DC | PRN

## 2020-12-25 MED ORDER — ALBUTEROL SULFATE HFA 108 (90 BASE) MCG/ACT IN AERS
2.0000 | INHALATION_SPRAY | RESPIRATORY_TRACT | Status: DC
  Administered 2020-12-25 (×4): 2 via RESPIRATORY_TRACT
  Filled 2020-12-25: qty 6.7

## 2020-12-25 MED ORDER — SODIUM CHLORIDE 0.9% FLUSH
3.0000 mL | Freq: Two times a day (BID) | INTRAVENOUS | Status: DC
  Administered 2020-12-25 – 2020-12-31 (×10): 3 mL via INTRAVENOUS
  Administered 2020-12-31: 10 mL via INTRAVENOUS
  Administered 2021-01-01 – 2021-01-04 (×4): 3 mL via INTRAVENOUS

## 2020-12-25 MED ORDER — FAMOTIDINE 20 MG PO TABS
20.0000 mg | ORAL_TABLET | Freq: Every day | ORAL | Status: DC
  Administered 2020-12-25 – 2020-12-26 (×2): 20 mg via ORAL
  Filled 2020-12-25 (×3): qty 1

## 2020-12-25 MED ORDER — LOPERAMIDE HCL 2 MG PO CAPS: 2.0000 mg | ORAL_CAPSULE | ORAL | Status: DC | PRN

## 2020-12-25 MED ORDER — PREDNISONE 20 MG PO TABS
50.0000 mg | ORAL_TABLET | Freq: Every day | ORAL | Status: DC
  Administered 2020-12-26: 50 mg via ORAL
  Filled 2020-12-25 (×2): qty 3

## 2020-12-25 MED ORDER — ZINC SULFATE 220 (50 ZN) MG PO CAPS
220.0000 mg | ORAL_CAPSULE | Freq: Every day | ORAL | Status: DC
  Administered 2020-12-25 – 2020-12-26 (×2): 220 mg via ORAL
  Filled 2020-12-25 (×4): qty 1

## 2020-12-25 MED ORDER — CARVEDILOL 25 MG PO TABS
25.0000 mg | ORAL_TABLET | Freq: Two times a day (BID) | ORAL | Status: DC
  Administered 2020-12-25 – 2020-12-26 (×3): 25 mg via ORAL
  Filled 2020-12-25: qty 1
  Filled 2020-12-25: qty 2
  Filled 2020-12-25 (×2): qty 1
  Filled 2020-12-25: qty 2
  Filled 2020-12-25: qty 1

## 2020-12-25 MED ORDER — ALBUTEROL SULFATE HFA 108 (90 BASE) MCG/ACT IN AERS
2.0000 | INHALATION_SPRAY | Freq: Three times a day (TID) | RESPIRATORY_TRACT | Status: DC
  Administered 2020-12-26 (×3): 2 via RESPIRATORY_TRACT
  Filled 2020-12-25 (×2): qty 6.7

## 2020-12-25 MED ORDER — GUAIFENESIN-DM 100-10 MG/5ML PO SYRP: 10.0000 mL | ORAL_SOLUTION | ORAL | Status: DC | PRN

## 2020-12-25 MED ORDER — SODIUM CHLORIDE 0.9 % IV SOLN
100.0000 mg | Freq: Every day | INTRAVENOUS | Status: DC
  Administered 2020-12-26 – 2020-12-27 (×2): 100 mg via INTRAVENOUS
  Filled 2020-12-25: qty 100
  Filled 2020-12-25: qty 20

## 2020-12-25 MED ORDER — ASPIRIN EC 81 MG PO TBEC
81.0000 mg | DELAYED_RELEASE_TABLET | Freq: Every day | ORAL | Status: DC
  Administered 2020-12-25 – 2020-12-26 (×2): 81 mg via ORAL
  Filled 2020-12-25 (×3): qty 1

## 2020-12-25 NOTE — ED Provider Notes (Signed)
[facility] EMERGENCY DEPARTMENT Provider Note   CSN: [id number] Arrival date & time: 01/01/2021  2106     History Chief Complaint  Patient presents with  . Shortness of Breath    Mario Torres is a 70 y.o. male.  HPI    70 year old male comes with a chief complaint of shortness of breath that is worsening. He was recently admitted for hypoxic respiratory failure secondary to COVID-19.  Patient was discharged with 3 L of oxygen.  He reports worsening shortness of breath, especially when moving around without any severe chest pain.  Patient also has persistent cough and diarrhea.  Positive dizziness and near fainting without actual syncope.  O2 sats noted to be at 76% when he first arrived.  Past Medical History:  Diagnosis Date  . Hyperlipidemia   . Hypertension   . MI (myocardial infarction) [facility])    2012    Patient Active Problem List   Diagnosis Date Noted  . Acute respiratory failure with hypoxia (HCC) 12/25/2020  . Lung mass 12/23/2020  . Acute hypoxemic respiratory failure due to COVID-19 (HCC) 12/20/2020  . AKI (acute kidney injury) (HCC) 12/20/2020  . Pneumonia due to COVID-19 virus 12/20/2020  . Syncope 12/20/2020  . Anal fissure 07/31/2019  . Erectile dysfunction 12/02/2018  . Hemorrhoids 12/02/2018  . Pain of right great toe 12/02/2018  . GERD (gastroesophageal reflux disease) 08/09/2013  . CAD- s/p PCI w/ DES to circumflex, and RCA in 2012- patent stents on re-look cath 08/10/13 08/07/2013  . HTN (hypertension) 08/07/2013  . HLD (hyperlipidemia) 08/07/2013  . History of non-ST elevation myocardial infarction (NSTEMI) -2012 08/07/2013    Past Surgical History:  Procedure Laterality Date  . CARDIAC CATHETERIZATION  02/06/2011   normal LV; CAD 40% mid left  LADafter thsecond diagonal;90-95% culprit stenosis prox.circ ;80-90% stenosis prox RCA.  Marland Kitchen CARDIAC CATHETERIZATION  11/24 /2003   normal cors  . CORONARY ANGIOPLASTY  02/06/2011    large dominant left circ sytem treated w/cutting balloon arthrotomy,stenting with 3.5 x 16 mm eerolimus Promus stent,postdilated to 4.0 mm  . CORONARY STENT PLACEMENT    . DOPPLER ECHOCARDIOGRAPHY  02/07/2011   EF 50 to 55%  mild -mod posterior wall hypokinesis,pulm vein flow pattern normal    . EVALUATION UNDER ANESTHESIA WITH HEMORRHOIDECTOMY N/A 07/31/2019   Procedure: EXAM UNDER ANESTHESIA WITH RUBBER BAND LIGATION OF INTERNAL HEMORRHOIDS;  Surgeon: Darnell Level, MD;  Location: [facility];  Service: General;  Laterality: N/A;  . FISSURECTOMY N/A 07/31/2019   Procedure: FISSURECTOMY;  Surgeon: Darnell Level, MD;  Location: [facility];  Service: General;  Laterality: N/A;  . KNEE SURGERY    . LEFT HEART CATHETERIZATION WITH CORONARY ANGIOGRAM N/A 08/10/2013   Procedure: LEFT HEART CATHETERIZATION WITH CORONARY ANGIOGRAM;  Surgeon: Lennette Bihari, MD;  Location: [facility] CATH LAB;  Service: Cardiovascular;  Laterality: N/A;  . NM MYOCAR PERF WALL MOTION  02/19/2012   EF 61% ,NO SIGNIFICANT WALL MOTION ABNORMALITIES NOTED  . SPHINCTEROTOMY N/A 07/31/2019   Procedure: LATERAL INTERNAL SPHINCTEROTOMY;  Surgeon: Darnell Level, MD;  Location: [facility];  Service: General;  Laterality: N/A;  . VASECTOMY         Family History  Problem Relation Age of Onset  . Hypertension Father   . Alcohol abuse Father   . Cirrhosis Father   . Stomach cancer Father   . Prostate cancer Neg Hx   . Colon cancer Neg Hx  Social History   Tobacco Use  . Smoking status: Former Smoker    Quit date: 08/31/1973    Years since quitting: 47.3  . Smokeless tobacco: Never Used  Substance Use Topics  . Alcohol use: No    Alcohol/week: 0.0 standard drinks  . Drug use: No    Home Medications Prior to Admission medications   Medication Sig Start Date End Date Taking? Authorizing Provider  acetaminophen (TYLENOL) 500 MG tablet Take 500 mg by mouth every 6 (six) hours as needed  for mild pain.   Yes [provider]  Ascorbic Acid (VITAMIN C) 1000 MG tablet Take 1,000 mg by mouth daily.   Yes [provider]  aspirin EC 81 MG tablet Take 81 mg by mouth daily.   Yes [provider]  atorvastatin (LIPITOR) 20 MG tablet TAKE 1 TABLET(20 MG) BY MOUTH DAILY Patient taking differently: Take 20 mg by mouth daily. 11/01/20  Yes Lennette Bihari, MD  benzonatate (TESSALON) 100 MG capsule Take 1 capsule (100 mg total) by mouth 3 (three) times daily as needed for cough. 12/22/20  Yes Osvaldo Shipper, MD  carvedilol (COREG) 25 MG tablet TAKE 1 TABLET(25 MG) BY MOUTH TWICE DAILY WITH A MEAL Patient taking differently: Take 25 mg by mouth 2 (two) times daily with a meal. 11/01/20  Yes Lennette Bihari, MD  cholecalciferol (VITAMIN D) 25 MCG (1000 UNIT) tablet Take 1,000 Units by mouth daily.   Yes [provider]  famotidine (PEPCID) 20 MG tablet Take 1 tablet (20 mg total) by mouth daily for 10 days. 12/23/20 01/02/21 Yes Osvaldo Shipper, MD  Multiple Vitamin (MULTIVITAMIN) tablet Take 1 tablet by mouth daily.   Yes [provider]  nitroGLYCERIN (NITROSTAT) 0.4 MG SL tablet Place 1 tablet (0.4 mg total) under the tongue every 5 (five) minutes x 3 doses as needed for chest pain. 03/31/20  Yes Meng, Wynema Birch, PA  Omega-3 Fatty Acids (FISH OIL) 1000 MG CAPS Take 1 capsule (1,000 mg total) by mouth daily. 11/18/18  Yes Azalee Course, PA  ondansetron (ZOFRAN) 4 MG tablet Take 1 tablet (4 mg total) by mouth every 8 (eight) hours as needed for nausea or vomiting. 12/19/20  Yes Terressa Koyanagi, DO  predniSONE (DELTASONE) 20 MG tablet Take 3 tablets once daily for 3 days followed by 2 tablets once daily for 3 days followed by 1 tablet once daily for 3 days and then stop 12/22/20  Yes Osvaldo Shipper, MD  shark liver oil-cocoa butter (PREPARATION H) 0.25-3-85.5 % suppository Place 1 suppository rectally as needed for hemorrhoids.   Yes [provider]  azithromycin  (ZITHROMAX) 250 MG tablet Day 1 take 2 pills, days 2-5 take 1 pill. Patient taking differently: Take 250 mg by mouth See admin instructions. Day 1 take 2 pills, days 2-5 take 1 pill. 12/23/20   Myrlene Broker, MD    Allergies    Crestor [rosuvastatin calcium]  Review of Systems   Review of Systems  Constitutional: Positive for activity change.  Respiratory: Positive for cough and shortness of breath.   Cardiovascular: Negative for chest pain.  Gastrointestinal: Positive for diarrhea, nausea and vomiting.  All other systems reviewed and are negative.   Physical Exam Updated Vital Signs BP (!) 160/93   Pulse 73   Temp 97.9 F (36.6 C) (Oral)   Resp 20   SpO2 100%   Physical Exam Vitals and nursing note reviewed.  Constitutional:      Appearance: He is  well-developed.  HENT:     Head: Normocephalic and atraumatic.  Eyes:     Extraocular Movements: EOM normal.     Conjunctiva/sclera: Conjunctivae normal.     Pupils: Pupils are equal, round, and reactive to light.  Cardiovascular:     Rate and Rhythm: Normal rate and regular rhythm.  Pulmonary:     Effort: Pulmonary effort is normal.     Breath sounds: Rales present.  Abdominal:     General: There is no distension.     Tenderness: There is no abdominal tenderness.  Musculoskeletal:        General: No deformity.     Cervical back: Normal range of motion and neck supple.  Skin:    General: Skin is warm.  Neurological:     Mental Status: He is alert and oriented to person, place, and time.     ED Results / Procedures / Treatments   Labs (all labs ordered are listed, but only abnormal results are displayed) Labs Reviewed  BASIC METABOLIC PANEL - Abnormal; Notable for the following components:      Result Value   Glucose, Bld 162 (*)    All other components within normal limits  HEPATIC FUNCTION PANEL - Abnormal; Notable for the following components:   Total Protein 6.4 (*)    Albumin 2.8 (*)    AST 47 (*)     Total Bilirubin 1.5 (*)    Bilirubin, Direct 0.4 (*)    Indirect Bilirubin 1.1 (*)    All other components within normal limits  D-DIMER, QUANTITATIVE (NOT [facility]) - Abnormal; Notable for the following components:   D-Dimer, Quant 5.17 (*)    All other components within normal limits  C-REACTIVE PROTEIN - Abnormal; Notable for the following components:   CRP 12.0 (*)    All other components within normal limits  CBC  PROCALCITONIN  TROPONIN I (HIGH SENSITIVITY)  TROPONIN I (HIGH SENSITIVITY)    EKG None  Radiology CT Angio Chest PE W and/or Wo Contrast  Result Date: 12/25/2020 CLINICAL DATA:  Pulmonary embolism suspected. EXAM: CT ANGIOGRAPHY CHEST WITH CONTRAST TECHNIQUE: Multidetector CT imaging of the chest was performed using the standard protocol during bolus administration of intravenous contrast. Multiplanar CT image reconstructions and MIPs were obtained to evaluate the vascular anatomy. CONTRAST:  100mL OMNIPAQUE IOHEXOL 350 MG/ML SOLN COMPARISON:  None. FINDINGS: Cardiovascular: A segment of the most peripheral segmental and subsegmental pulmonary arteries cannot be definitively characterized due to patient breathing motion artifact, however, there is no pulmonary embolism identified within the main, lobar or central segmental pulmonary arteries bilaterally. Cardiomegaly. No pericardial effusion. Three-vessel coronary artery calcifications. Mild aortic atherosclerosis. No thoracic aortic aneurysm. Mediastinum/Nodes: No mass or enlarged lymph nodes are seen within the mediastinum or perihilar regions. Esophagus is unremarkable. Trachea appears normal. Lungs/Pleura: Diffuse bilateral ground-glass airspace opacities. No pleural effusion or pneumothorax. Upper Abdomen: Bilateral renal masses, incompletely imaged, presumed cysts. Limited images of the upper abdomen are otherwise unremarkable. Musculoskeletal: Degenerative spondylosis of the kyphotic thoracolumbar spine, mild to moderate  in degree. Review of the MIP images confirms the above findings. IMPRESSION: 1. Diffuse bilateral ground-glass airspace opacities. Differential includes atypical pneumonias such as viral or fungal, interstitial pneumonias, edema related to volume overload/CHF, chronic interstitial diseases, hypersensitivity pneumonitis, and respiratory bronchiolitis. Favor diffuse bilateral COVID-related pneumonia. 2. No pulmonary embolism, with mild study limitations detailed above. 3. Cardiomegaly. Three-vessel coronary artery calcifications. 4. Bilateral renal masses, incompletely imaged, presumed cysts. Consider nonemergent follow-up with renal CT  or MRI after current pulmonary issues are resolved. Aortic Atherosclerosis (ICD10-I70.0). Electronically Signed   By: Bary Richard M.D.   On: 12/25/2020 11:55   DG Chest Portable 1 View  Result Date: 12/23/2020 CLINICAL DATA:  Increasing shortness of breath. COVID positive with recent hospitalization. EXAM: PORTABLE CHEST 1 VIEW COMPARISON:  Chest radiograph 4 days ago 12/20/2020 FINDINGS: Generalized increase in patchy and interstitial opacities throughout both lungs since prior exam. The questioned right apical mass on recent radiograph is only tentatively visualized, currently overlapping the right first rib. The peripheral opacity in the left mid lung appears more diffuse and less confluent. Mild cardiomegaly with unchanged mediastinal contours. Aortic atherosclerosis. There may be small pleural effusions. No pneumothorax. No acute osseous abnormalities are seen. IMPRESSION: 1. Generalized increase in patchy and interstitial opacities throughout both lungs since prior exam. Question of small pleural effusions. In the setting of COVID infection favor progressive multifocal pneumonia. An element of pulmonary edema is also considered. 2. The questioned right apical mass on recent radiograph is only tentatively visualized on the current exam. Peripheral left lung opacity on prior  is more diffuse. Recommend follow-up chest CT for characterization. Electronically Signed   By: Narda Rutherford M.D.   On: 12/18/2020 22:23    Procedures .Critical Care Performed by: Derwood Kaplan, MD Authorized by: Derwood Kaplan, MD   Critical care provider statement:    Critical care time (minutes):  36   Critical care was necessary to treat or prevent imminent or life-threatening deterioration of the following conditions:  Respiratory failure   Critical care was time spent personally by me on the following activities:  Discussions with consultants, evaluation of patient's response to treatment, examination of patient, ordering and performing treatments and interventions, ordering and review of laboratory studies, ordering and review of radiographic studies, pulse oximetry, re-evaluation of patient's condition, obtaining history from patient or surrogate and review of old charts   (including critical care time)  Medications Ordered in ED Medications  albuterol (VENTOLIN HFA) 108 (90 Base) MCG/ACT inhaler 2 puff (has no administration in time range)  albuterol (VENTOLIN HFA) 108 (90 Base) MCG/ACT inhaler 2 puff (2 puffs Inhalation Given 12/25/20 1603)  azithromycin (ZITHROMAX) tablet 250 mg (250 mg Oral Given 12/25/20 1304)  aspirin EC tablet 81 mg (81 mg Oral Given 12/25/20 1303)  famotidine (PEPCID) tablet 20 mg (20 mg Oral Given 12/25/20 1303)  benzonatate (TESSALON) capsule 100 mg (has no administration in time range)  enoxaparin (LOVENOX) injection 40 mg (40 mg Subcutaneous Given 12/25/20 1304)  sodium chloride flush (NS) 0.9 % injection 3 mL (3 mLs Intravenous Given 12/25/20 1304)  guaiFENesin-dextromethorphan (ROBITUSSIN DM) 100-10 MG/5ML syrup 10 mL (has no administration in time range)  chlorpheniramine-HYDROcodone (TUSSIONEX) 10-8 MG/5ML suspension 5 mL (has no administration in time range)  predniSONE (DELTASONE) tablet 50 mg (has no administration in time range)  ascorbic acid  (VITAMIN C) tablet 500 mg (500 mg Oral Given 12/25/20 1303)  zinc sulfate capsule 220 mg (220 mg Oral Given 12/25/20 1304)  ondansetron (ZOFRAN) tablet 4 mg (has no administration in time range)    Or  ondansetron (ZOFRAN) injection 4 mg (has no administration in time range)  acetaminophen (TYLENOL) tablet 650 mg (has no administration in time range)  tocilizumab (ACTEMRA) 800 mg in sodium chloride 0.9 % 100 mL infusion (800 mg Intravenous New Bag/Given 12/25/20 1602)  atorvastatin (LIPITOR) tablet 20 mg (20 mg Oral Given 12/25/20 1303)  carvedilol (COREG) tablet 25 mg (has no administration in  time range)  remdesivir 200 mg in sodium chloride 0.9% 250 mL IVPB (has no administration in time range)    Followed by  remdesivir 100 mg in sodium chloride 0.9 % 100 mL IVPB (has no administration in time range)  loperamide (IMODIUM) capsule 2 mg (has no administration in time range)  iohexol (OMNIPAQUE) 350 MG/ML injection 100 mL (100 mLs Intravenous Contrast Given 12/25/20 1147)    ED Course  I have reviewed the triage vital signs and the nursing notes.  Pertinent labs & imaging results that were available during my care of the patient were reviewed by me and considered in my medical decision making (see chart for details).    MDM Rules/Calculators/A&P                          Joneen CarawayStanley E Kyer was evaluated in Emergency Department on 12/26/2020 for the symptoms described in the history of present illness. He was evaluated in the context of the global COVID-19 pandemic, which necessitated consideration that the patient might be at risk for infection with the SARS-CoV-2 virus that causes COVID-19. Institutional protocols and algorithms that pertain to the evaluation of patients at risk for COVID-19 are in a state of rapid change based on information released by regulatory bodies including the CDC and federal and state organizations. These policies and algorithms were followed during the patient's care in the  ED.  70 year old male comes in a chief complaint of shortness of breath.  Recently diagnosed with COVID-19 and discharged with oxygen.  He is requiring increased oxygen and reports worsening shortness of breath, especially with exertion.  CT PE ordered and is negative.  Medicine to admit.  Final Clinical Impression(s) / ED Diagnoses Final diagnoses:  Acute on chronic respiratory failure with hypoxia (HCC)  COVID-19    Rx / DC Orders ED Discharge Orders    None       Derwood KaplanNanavati, Hartwell Vandiver, MD 12/26/20 2127

## 2020-12-25 NOTE — Progress Notes (Signed)
RT called to check on patient due to his oxygen saturations in the low 80s o n 15L salter. RT placed patient on 15L non-rebreather over 15L salter. Oxygen saturations improved to high 90s.

## 2020-12-25 NOTE — H&P (Addendum)
History and Physical    Joneen CarawayStanley E Norem WUJ:[id number] DOB: November 01, 1951 DOA: 01/02/2021  Referring MD/NP/PA: Derwood KaplanAnkit Nanavati, MD PCP: Ardith DarkParker, Caleb M, MD  Patient coming from: Home via EMS  Chief Complaint: Shortness of breath  I have personally briefly reviewed patient's old medical records in [facility]   HPI: Joneen CarawayStanley E Kwiatek is a 70 y.o. male with medical history significant of hypertension, hyperlipidemia, CAD s/p DES, lung mass, and unvaccinated wound COVID-19 positive on 1/3 presents with worsening shortness of breath.  Patient had just recently been hospitalized from 1/3-1/6 chest x-ray given acute hypoxic respiratory failure secondary to pneumonia due to COVID-19 after having a syncopal episode.  Patient had been treated with steroids, but had not been started on remdesivir as initial symptoms reported to have started on 12/28.  He was discharged home 3 L of oxygen.  Since that time complains of shortness of breath unable to get up and move around.  Complains of continued cough and diarrhea.  Denied having any subsequent episodes of passing out.   ED Course: Upon [facility]ir[facility] 14-35, and O2 saturations as low as 76% currently O2 saturations around 86 to 89% on 15 L of HFNC.  Initial labs revealed CBC within normal limits creatinine 1.14, and glucose 162.  Chest x-ray significant for concern for worsening multifocal pneumonia with possibility of pulmonary edema.  CTA of the chest was pending.  TRH called to admit patient had been given albuterol inhaler.  Review of Systems  Constitutional: Positive for malaise/fatigue. Negative for fever.  HENT: Negative for ear discharge and ear pain.   Eyes: Negative for photophobia and pain.  Respiratory: Positive for cough and shortness of breath.   Cardiovascular: Negative for chest pain and leg swelling.  Gastrointestinal: Positive for diarrhea. Negative for abdominal  pain, blood in stool and vomiting.  Genitourinary: Negative for dysuria and hematuria.  Musculoskeletal: Negative for joint pain and myalgias.  Skin: Negative for rash.  Neurological: Positive for weakness. Negative for focal weakness and loss of consciousness.  Psychiatric/Behavioral: Negative for substance abuse. The patient has insomnia.     Past Medical History:  Diagnosis Date  . Hyperlipidemia   . Hypertension   . MI (myocardial infarction) (HCC)    2012    Past Surgical History:  Procedure Laterality Date  . CARDIAC CATHETERIZATION  02/06/2011   normal LV; CAD 40% mid left  LADafter thsecond diagonal;90-95% culprit stenosis prox.circ ;80-90% stenosis prox RCA.  Marland Kitchen. CARDIAC CATHETERIZATION  11/24 /2003   normal cors  . CORONARY ANGIOPLASTY  02/06/2011   large dominant left circ sytem treated w/cutting balloon arthrotomy,stenting with 3.5 x 16 mm eerolimus Promus stent,postdilated to 4.0 mm  . CORONARY STENT PLACEMENT    . DOPPLER ECHOCARDIOGRAPHY  02/07/2011   EF 50 to 55%  mild -mod posterior wall hypokinesis,pulm vein flow pattern normal    . EVALUATION UNDER ANESTHESIA WITH HEMORRHOIDECTOMY N/A 07/31/2019   Procedure: EXAM UNDER ANESTHESIA WITH RUBBER BAND LIGATION OF INTERNAL HEMORRHOIDS;  Surgeon: Darnell LevelGerkin, Todd, MD;  Location: [facility];  Service: General;  Laterality: N/A;  . FISSURECTOMY N/A 07/31/2019   Procedure: FISSURECTOMY;  Surgeon: Darnell LevelGerkin, Todd, MD;  Location: [facility];  Service: General;  Laterality: N/A;  . KNEE SURGERY    . LEFT HEART CATHETERIZATION WITH CORONARY ANGIOGRAM N/A 08/10/2013   Procedure: LEFT HEART CATHETERIZATION WITH CORONARY ANGIOGRAM;  Surgeon: Lennette Biharihomas A Kelly, MD;  [facility];  Service: Cardiovascular;  Laterality: N/A;  . NM MYOCAR PERF WALL MOTION  02/19/2012   EF 61% ,NO SIGNIFICANT WALL MOTION ABNORMALITIES NOTED  . SPHINCTEROTOMY N/A 07/31/2019   Procedure: LATERAL INTERNAL SPHINCTEROTOMY;  Surgeon:  Darnell LevelGerkin, Todd, MD;  Location: [facility];  Service: General;  Laterality: N/A;  . VASECTOMY       reports that he quit smoking about 47 years ago. He has never used smokeless tobacco. He reports that he does not drink alcohol and does not use drugs.  Allergies  Allergen Reactions  . Crestor [Rosuvastatin Calcium] Other (See Comments)    Joint pain, ankle pain     Family History  Problem Relation Age of Onset  . Hypertension Father   . Alcohol abuse Father   . Cirrhosis Father   . Stomach cancer Father   . Prostate cancer Neg Hx   . Colon cancer Neg Hx     Prior to Admission medications   Medication Sig Start Date End Date Taking? Authorizing Provider  acetaminophen (TYLENOL) 500 MG tablet Take 500 mg by mouth every 6 (six) hours as needed for mild pain.   Yes [provider]  Ascorbic Acid (VITAMIN C) 1000 MG tablet Take 1,000 mg by mouth daily.   Yes [provider]  aspirin EC 81 MG tablet Take 81 mg by mouth daily.   Yes [provider]  atorvastatin (LIPITOR) 20 MG tablet TAKE 1 TABLET(20 MG) BY MOUTH DAILY Patient taking differently: Take 20 mg by mouth daily. 11/01/20  Yes Lennette BihariKelly, Thomas A, MD  benzonatate (TESSALON) 100 MG capsule Take 1 capsule (100 mg total) by mouth 3 (three) times daily as needed for cough. 12/22/20  Yes Osvaldo ShipperKrishnan, Gokul, MD  carvedilol (COREG) 25 MG tablet TAKE 1 TABLET(25 MG) BY MOUTH TWICE DAILY WITH A MEAL Patient taking differently: Take 25 mg by mouth 2 (two) times daily with a meal. 11/01/20  Yes Lennette BihariKelly, Thomas A, MD  cholecalciferol (VITAMIN D) 25 MCG (1000 UNIT) tablet Take 1,000 Units by mouth daily.   Yes [provider]  famotidine (PEPCID) 20 MG tablet Take 1 tablet (20 mg total) by mouth daily for 10 days. 12/23/20 01/02/21 Yes Osvaldo ShipperKrishnan, Gokul, MD  Multiple Vitamin (MULTIVITAMIN) tablet Take 1 tablet by mouth daily.   Yes [provider]  nitroGLYCERIN (NITROSTAT) 0.4 MG SL tablet Place 1  tablet (0.4 mg total) under the tongue every 5 (five) minutes x 3 doses as needed for chest pain. 03/31/20  Yes Meng, Wynema BirchHao, PA  Omega-3 Fatty Acids (FISH OIL) 1000 MG CAPS Take 1 capsule (1,000 mg total) by mouth daily. 11/18/18  Yes Azalee CourseMeng, Hao, PA  ondansetron (ZOFRAN) 4 MG tablet Take 1 tablet (4 mg total) by mouth every 8 (eight) hours as needed for nausea or vomiting. 12/19/20  Yes Terressa KoyanagiKim, Hannah R, DO  predniSONE (DELTASONE) 20 MG tablet Take 3 tablets once daily for 3 days followed by 2 tablets once daily for 3 days followed by 1 tablet once daily for 3 days and then stop 12/22/20  Yes Osvaldo ShipperKrishnan, Gokul, MD  shark liver oil-cocoa butter (PREPARATION H) 0.25-3-85.5 % suppository Place 1 suppository rectally as needed for hemorrhoids.   Yes [provider]  azithromycin (ZITHROMAX) 250 MG tablet Day 1 take 2 pills, days 2-5 take 1 pill. Patient taking differently: Take 250 mg by mouth See admin instructions. Day 1 take 2 pills, days 2-5 take 1 pill. 12/23/20   Myrlene Brokerrawford, Elizabeth A, MD  Physical Exam:  Constitutional: Elderly obese male who appears to be acutely ill Vitals:   12/25/20 0900 12/25/20 0915 12/25/20 0930 12/25/20 1000  BP: (!) 140/115  (!) 143/87 (!) 145/82  Pulse: 71 97 70 70  Resp: (!) 24 14 20 18   Temp:      TempSrc:      SpO2: 100% (!) 85% 95% 95%   Eyes: PERRL, lids and conjunctivae normal ENMT: Mucous membranes are moist. Posterior pharynx clear of any exudate or lesions.  Neck: normal, supple Respiratory: Mildly tachypneic with mildly decreased aeration.  No significant rales or rhonchi appreciated. Cardiovascular: Regular rate and rhythm, no murmurs / rubs / gallops. No extremity edema. 2+ pedal pulses. No carotid bruits.  Abdomen: no tenderness, no masses palpated. No hepatosplenomegaly. Bowel sounds positive.  Musculoskeletal: no clubbing / cyanosis. No joint deformity upper and lower extremities. Good ROM, no contractures. Normal muscle tone.  Skin: no rashes,  lesions, ulcers. No induration Neurologic: CN 2-12 grossly intact. Sensation intact, DTR normal. Strength 5/5 in all 4.  Psychiatric: Normal judgment and insight. Alert and oriented x 3. Normal mood.     Labs on Admission: I have personally reviewed following labs and imaging studies  CBC: Recent Labs  Lab 12/19/20 2345 12/20/20 0527 12/21/20 0859 12/22/20 0435 01/16/2021 2156  WBC 4.6 4.4 5.5 6.7 8.8  NEUTROABS  --  2.7  --   --   --   HGB 16.2 15.3 16.6 15.8 16.3  HCT 46.4 43.2 47.9 44.8 48.4  MCV 90.8 91.7 90.5 90.0 90.5  PLT 93* 85* 101* 114* 168   Basic Metabolic Panel: Recent Labs  Lab 12/19/20 2345 12/20/20 0527 12/21/20 0859 12/22/20 0435 12/23/2020 2156  NA 132* 128* 133* 137 136  K 4.4 4.1 4.3 4.3 4.4  CL 97* 99 102 104 101  CO2 25 18* 17* 22 24  GLUCOSE 139* 150* 206* 192* 162*  BUN 20 26* 38* 30* 18  CREATININE 1.82* 2.67* 1.61* 1.32* 1.14  CALCIUM 8.8* 8.2* 9.1 8.7* 9.2   GFR: Estimated Creatinine Clearance: 73.6 mL/min (by C-G formula based on SCr of 1.14 mg/dL). Liver Function Tests: Recent Labs  Lab 12/20/20 0527 12/21/20 0859 12/22/20 0435  AST 46* 55* 77*  ALT 27 29 36  ALKPHOS 38 41 41  BILITOT 0.9 0.9 1.0  PROT 5.8* 6.4* 6.1*  ALBUMIN 3.3* 3.3* 3.2*   No results for input(s): LIPASE, AMYLASE in the last 168 hours. No results for input(s): AMMONIA in the last 168 hours. Coagulation Profile: No results for input(s): INR, PROTIME in the last 168 hours. Cardiac Enzymes: No results for input(s): CKTOTAL, CKMB, CKMBINDEX, TROPONINI in the last 168 hours. BNP (last 3 results) No results for input(s): PROBNP in the last 8760 hours. HbA1C: No results for input(s): HGBA1C in the last 72 hours. CBG: Recent Labs  Lab 12/20/20 0657  GLUCAP 136*   Lipid Profile: No results for input(s): CHOL, HDL, LDLCALC, TRIG, CHOLHDL, LDLDIRECT in the last 72 hours. Thyroid Function Tests: No results for input(s): TSH, T4TOTAL, FREET4, T3FREE, THYROIDAB  in the last 72 hours. Anemia Panel: No results for input(s): VITAMINB12, FOLATE, FERRITIN, TIBC, IRON, RETICCTPCT in the last 72 hours. Urine analysis:    Component Value Date/Time   COLORURINE YELLOW 12/20/2020 1455   APPEARANCEUR HAZY (A) 12/20/2020 1455   LABSPEC 1.012 12/20/2020 1455   PHURINE 5.0 12/20/2020 1455   GLUCOSEU NEGATIVE 12/20/2020 1455   HGBUR NEGATIVE 12/20/2020 1455   BILIRUBINUR NEGATIVE 12/20/2020 1455  KETONESUR NEGATIVE 12/20/2020 1455   PROTEINUR 30 (A) 12/20/2020 1455   UROBILINOGEN 1.0 08/07/2013 1127   NITRITE NEGATIVE 12/20/2020 1455   LEUKOCYTESUR NEGATIVE 12/20/2020 1455   Sepsis Labs: Recent Results (from the past 240 hour(s))  Resp Panel by RT-PCR (Flu A&B, Covid) Nasopharyngeal Swab     Status: Abnormal   Collection Time: 12/19/20 11:53 PM   Specimen: Nasopharyngeal Swab; Nasopharyngeal(NP) swabs in vial transport medium  Result Value Ref Range Status   SARS Coronavirus 2 by RT PCR POSITIVE (A) NEGATIVE Final    Comment: RESULT CALLED TO, READ BACK BY AND VERIFIED WITH: M. Alonza Bogus 0150 12/20/2020 T. TYSOR (NOTE) SARS-CoV-2 target nucleic acids are DETECTED.  The SARS-CoV-2 RNA is generally detectable in upper respiratory specimens during the acute phase of infection. Positive results are indicative of the presence of the identified virus, but do not rule out bacterial infection or co-infection with other pathogens not detected by the test. Clinical correlation with patient history and other diagnostic information is necessary to determine patient infection status. The expected result is Negative.  Fact Sheet for Patients: BloggerCourse.com  Fact Sheet for Healthcare Providers: SeriousBroker.it  This test is not yet approved or cleared by the [place] FDA and  has been authorized for detection and/or diagnosis of SARS-CoV-2 by FDA under an Emergency Use Authorization (EUA).  This  EUA will remain in effect (meaning this test  can be used) for the duration of  the COVID-19 declaration under Section 564(b)(1) of the Act, 21 U.S.C. section 360bbb-3(b)(1), unless the authorization is terminated or revoked sooner.     Influenza A by PCR NEGATIVE NEGATIVE Final   Influenza B by PCR NEGATIVE NEGATIVE Final    Comment: (NOTE) The Xpert Xpress SARS-CoV-2/FLU/RSV plus assay is intended as an aid in the diagnosis of influenza from Nasopharyngeal swab specimens and should not be used as a sole basis for treatment. Nasal washings and aspirates are unacceptable for Xpert Xpress SARS-CoV-2/FLU/RSV testing.  Fact Sheet for Patients: BloggerCourse.com  Fact Sheet for Healthcare Providers: SeriousBroker.it  This test is not yet approved or cleared by the [place] FDA and has been authorized for detection and/or diagnosis of SARS-CoV-2 by FDA under an Emergency Use Authorization (EUA). This EUA will remain in effect (meaning this test can be used) for the duration of the COVID-19 declaration under Section 564(b)(1) of the Act, 21 U.S.C. section 360bbb-3(b)(1), unless the authorization is terminated or revoked.  Performed at [facility], [address] [place], [place] [address]   Blood Culture (routine x 2)     Status: None (Preliminary result)   Collection Time: 12/20/20  3:50 AM   Specimen: BLOOD  Result Value Ref Range Status   Specimen Description BLOOD RIGHT ARM  Final   Special Requests   Final    BOTTLES DRAWN AEROBIC AND ANAEROBIC Blood Culture adequate volume   Culture   Final    NO GROWTH 4 DAYS Performed at [facility], [address], [place], [place] [address]    Report Status PENDING  Incomplete  Blood Culture (routine x 2)     Status: None (Preliminary result)   Collection Time: 12/20/20  5:35 AM   Specimen: BLOOD  Result Value Ref Range Status   Specimen Description BLOOD RIGHT  HAND  Final   Special Requests   Final    BOTTLES DRAWN AEROBIC AND ANAEROBIC Blood Culture adequate volume   Culture   Final    NO GROWTH  4 DAYS Performed at [facility], [address] [place], [place] [address]    Report Status PENDING  Incomplete     Radiological Exams on Admission: DG Chest Portable 1 View  Result Date: 01-20-21 CLINICAL DATA:  Increasing shortness of breath. COVID positive with recent hospitalization. EXAM: PORTABLE CHEST 1 VIEW COMPARISON:  Chest radiograph 4 days ago 12/20/2020 FINDINGS: Generalized increase in patchy and interstitial opacities throughout both lungs since prior exam. The questioned right apical mass on recent radiograph is only tentatively visualized, currently overlapping the right first rib. The peripheral opacity in the left mid lung appears more diffuse and less confluent. Mild cardiomegaly with unchanged mediastinal contours. Aortic atherosclerosis. There may be small pleural effusions. No pneumothorax. No acute osseous abnormalities are seen. IMPRESSION: 1. Generalized increase in patchy and interstitial opacities throughout both lungs since prior exam. Question of small pleural effusions. In the setting of COVID infection favor progressive multifocal pneumonia. An element of pulmonary edema is also considered. 2. The questioned right apical mass on recent radiograph is only tentatively visualized on the current exam. Peripheral left lung opacity on prior is more diffuse. Recommend follow-up chest CT for characterization. Electronically Signed   By: Narda Rutherford M.D.   On: 2021-01-20 22:23    Chest x-ray: Independently reviewed.  Worsening bilateral opacities  Assessment/Plan Acute hypoxic respiratory failure secondary to pneumonia due to COVID-19: Patient presents with plaints of worsening shortness of breath that diarrhea.  O2 saturations noted to be in the 70s for which patient had been placed on heated high flow nasal cannula  currently on 15 L with O2 saturations of 86-89%. -Admit to a telemetry bed -COVID-19 order set utilized -Continuous pulse oximetry with oxygen to maintain O2 saturations greater than 90% -Check inflammatory markers and monitor daily -Follow-up CT angiogram of the chest -Monitor intake and output -Albuterol inhaler -Start remdesivir even though patient out of the acute phase due to worsening of symptoms. -Give Actemra x1 dose -Continue prednisone 50 mg -Vitamin C and zinc -Antitussives as needed -Discussed patient's case with PCCM who currently appears relatively comfortable.  However, we will formally consult PCCM if patient develops worsening respiratory distress or O2 saturations are not maintained greater than 85%     Lung mass: Noted during previous admission on chest x-ray right apical masses only partially noted on repeat chest x-ray today. -Follow-up CT angiogram of the chest -Will need further work-up most likely in outpatient setting after acute symptoms improve  Essential hypertension: Stable Home medications include Coreg 25 mg twice daily.  He had been on lisinopril prior to his previous hospitalization, but was held due to AKI. -Continue Coreg  History of CAD: Patient prior history of drug-eluting stent.  Previously with normal stress test in 04/13/2020 -Continue aspirin, statin, and beta-blocker  Hyperlipidemia -Continue atorvastatin 20 mg daily  Thrombocytopenia: Stable.  Platelet count 114 on admission. -Continue to my  Obesity: BMI 34.7 kg/m  GI prophylaxis: Continue Pepcid DVT prophylaxis: Lovenox Code Status: Full code as decided on by patient and his wife prior to arrival Family Communication: wife updated over the phone Disposition Plan: To be determined Consults called:PCCM  Admission status: Inpatient, require at least 2 midnight stay due to worsening respiratory failure in the setting of COVID-19 pneumonia  Clydie Braun MD [facility]   If 7PM-7AM, please contact night-coverage   12/25/2020, 11:08 AM

## 2020-12-25 NOTE — ED Notes (Signed)
Patient resting on stretcher with eyes closed, NRB at 15L, SPO2 100% at this time. Room safe, call bell in reach. NAD noted at this time.

## 2020-12-25 NOTE — ED Notes (Signed)
Pts wife given an update on pt status and knows about movement to a holding room within the ED

## 2020-12-25 NOTE — ED Notes (Signed)
Found pt up out of bed without salter oxygen on. Pt stated he had to use the bathroom and did not call for assistance. Pts oxygen saturation dropped to the 60's. Pt was placed back in bed and salter put back on pt immediately. This RN educated pt on importance of keeping oxygen on and utilizing the call light.

## 2020-12-25 NOTE — ED Notes (Signed)
Patient SPO2 85% on Salter HFNC. Patient unable to tolerate any activity. Patient requested to get out of bed to use [facility], this RN advised against same at this time and provided patient with urinal. RT contacted and will be in to see patient.

## 2020-12-25 NOTE — Progress Notes (Signed)
Pt removed from NRB and placed onto a 15L HFNC [place]. SATs for the most part staying between 88-95% but will drop down into low 80s. NRB at bedside on 15L. Pt encouraged to use NRB overtop of HFNC while exerting any energy to prevent a drop in his SATs. RRT educated pt on the need to prone when able to and coached on breath holds for lung recruitment. RRT will continue to monitor pt for the need for more O2.

## 2020-12-25 NOTE — ED Notes (Signed)
Wife brought pt drinks as well as medications from home. "heart medicine, baby Asprin, antibiotic,steroid, and another they prescribed when he came home Thursday". Pt was given medications at this time

## 2020-12-26 ENCOUNTER — Encounter (HOSPITAL_COMMUNITY): Payer: Self-pay | Admitting: Internal Medicine

## 2020-12-26 ENCOUNTER — Telehealth: Payer: Self-pay

## 2020-12-26 DIAGNOSIS — I700 Atherosclerosis of aorta: Secondary | ICD-10-CM

## 2020-12-26 DIAGNOSIS — N2889 Other specified disorders of kidney and ureter: Secondary | ICD-10-CM

## 2020-12-26 HISTORY — DX: Other specified disorders of kidney and ureter: N28.89

## 2020-12-26 LAB — CBC WITH DIFFERENTIAL/PLATELET
Abs Immature Granulocytes: 0.04 10*3/uL (ref 0.00–0.07)
Basophils Absolute: 0 10*3/uL (ref 0.0–0.1)
Basophils Relative: 0 %
Eosinophils Absolute: 0.1 10*3/uL (ref 0.0–0.5)
Eosinophils Relative: 1 %
HCT: 47.5 % (ref 39.0–52.0)
Hemoglobin: 16.7 g/dL (ref 13.0–17.0)
Immature Granulocytes: 1 %
Lymphocytes Relative: 11 %
Lymphs Abs: 0.7 10*3/uL (ref 0.7–4.0)
MCH: 31.9 pg (ref 26.0–34.0)
MCHC: 35.2 g/dL (ref 30.0–36.0)
MCV: 90.6 fL (ref 80.0–100.0)
Monocytes Absolute: 0.1 10*3/uL (ref 0.1–1.0)
Monocytes Relative: 2 %
Neutro Abs: 5.6 10*3/uL (ref 1.7–7.7)
Neutrophils Relative %: 85 %
Platelets: 191 10*3/uL (ref 150–400)
RBC: 5.24 MIL/uL (ref 4.22–5.81)
RDW: 12.6 % (ref 11.5–15.5)
WBC: 6.6 10*3/uL (ref 4.0–10.5)
nRBC: 0 % (ref 0.0–0.2)

## 2020-12-26 LAB — C-REACTIVE PROTEIN: CRP: 10.1 mg/dL — ABNORMAL HIGH (ref <1.0)

## 2020-12-26 LAB — COMPREHENSIVE METABOLIC PANEL
ALT: 35 U/L (ref 0–44)
AST: 58 U/L — ABNORMAL HIGH (ref 15–41)
Albumin: 2.7 g/dL — ABNORMAL LOW (ref 3.5–5.0)
Alkaline Phosphatase: 69 U/L (ref 38–126)
Anion gap: 11 (ref 5–15)
BUN: 26 mg/dL — ABNORMAL HIGH (ref 8–23)
CO2: 24 mmol/L (ref 22–32)
Calcium: 8.8 mg/dL — ABNORMAL LOW (ref 8.9–10.3)
Chloride: 102 mmol/L (ref 98–111)
Creatinine, Ser: 1.11 mg/dL (ref 0.61–1.24)
GFR, Estimated: >60 mL/min (ref >60)
Glucose, Bld: 125 mg/dL — ABNORMAL HIGH (ref 70–99)
Potassium: 4 mmol/L (ref 3.5–5.1)
Sodium: 137 mmol/L (ref 135–145)
Total Bilirubin: 1.1 mg/dL (ref 0.3–1.2)
Total Protein: 5.9 g/dL — ABNORMAL LOW (ref 6.5–8.1)

## 2020-12-26 LAB — MAGNESIUM: Magnesium: 2.2 mg/dL (ref 1.7–2.4)

## 2020-12-26 LAB — CBG MONITORING, ED: Glucose-Capillary: 195 mg/dL — ABNORMAL HIGH (ref 70–99)

## 2020-12-26 LAB — PHOSPHORUS: Phosphorus: 3.1 mg/dL (ref 2.5–4.6)

## 2020-12-26 LAB — FERRITIN: Ferritin: 756 ng/mL — ABNORMAL HIGH (ref 24–336)

## 2020-12-26 LAB — D-DIMER, QUANTITATIVE: D-Dimer, Quant: 6.83 ug/mL-FEU — ABNORMAL HIGH (ref 0.00–0.50)

## 2020-12-26 MED ORDER — INSULIN ASPART 100 UNIT/ML ~~LOC~~ SOLN
0.0000 [IU] | Freq: Three times a day (TID) | SUBCUTANEOUS | Status: DC
  Administered 2020-12-27: 5 [IU] via SUBCUTANEOUS

## 2020-12-26 MED ORDER — METHYLPREDNISOLONE SODIUM SUCC 125 MG IJ SOLR
1.0000 mg/kg | Freq: Two times a day (BID) | INTRAMUSCULAR | Status: DC
  Administered 2020-12-26 (×2): 106.875 mg via INTRAVENOUS
  Filled 2020-12-26 (×3): qty 2

## 2020-12-26 MED ORDER — INSULIN ASPART 100 UNIT/ML ~~LOC~~ SOLN: 0.0000 [IU] | Freq: Every day | SUBCUTANEOUS | Status: DC

## 2020-12-26 NOTE — ED Notes (Addendum)
Pt with black stool. Dr, Irene Limbo notified via chat. No new orders received.

## 2020-12-26 NOTE — Hospital Course (Signed)
70 year old man discharged 1/7 after being treated for acute hypoxic respiratory failure secondary to COVID-19 pneumonia, discharged on 3 L, presented to ED 1/9 with worsening shortness of breath admitted for worsening hypoxic respiratory failure secondary to Covid pneumonia  Acute hypoxic respiratory failure secondary to multifocal COVID pneumonia --Critically ill, dyspneic and hypoxic, SPO2 low 90s on nonrebreather and high flow nasal cannula but unable to eat.  May need heated high flow to decrease respiratory drive and allowed to eat.  No signs of impending ventilatory failure at this time.  CT showed diffuse bilateral groundglass opacities, no PE Fever: none Oxygen requirement: HFNC, NRB Antibiotics: none Remdesivir: 1/9 > Steroids: 1/9 > Actemra: 1/9  Vitamin C and Zinc Proning: recommended as tolerated   Inflammatory markers: CRP: 12.0> 10.1 D-dimer: 5.17 > 6.83  Ferritin 756> Procalcitonin: 0.34  CAD status post drug-eluting stent PMH --asymptomatic --Continue beta-blocker, statin and aspirin  Aortic atherosclerosis --continue statin  Bilateral renal masses incompletely imaged --Nonemergent CT renal or MRI after acute illness resolved.  Possible lung mass from prior admission.  CTA no abnormalities noted.

## 2020-12-26 NOTE — ED Notes (Signed)
Pt noted to not have any PIV's when this RN went in to room for nightly medications. When asked if they were removed pt reported that he removed them himself because they hurt and he didn't like them. Pt educated on reason why he need's PIV access and that he is receiving medications through that route. Pt refused a PIV at this time and would like melatonin to sleep. Advised pt that when IV medications are due he will need access; pt agreeable.

## 2020-12-26 NOTE — Progress Notes (Addendum)
PROGRESS NOTE  Mario Torres NAT:[id number] DOB: 20-Dec-1950 DOA: 12/19/2020 PCP: Ardith Dark, MD  Brief History   70 year old man discharged 1/7 after being treated for acute hypoxic respiratory failure secondary to COVID-19 pneumonia, discharged on 3 L, presented to ED 1/9 with worsening shortness of breath admitted for worsening hypoxic respiratory failure secondary to Covid pneumonia  Acute hypoxic respiratory failure secondary to multifocal COVID pneumonia --Critically ill, dyspneic and hypoxic, SPO2 low 90s on nonrebreather and high flow nasal cannula but unable to eat.  May need heated high flow to decrease respiratory drive and allowed to eat.  No signs of impending ventilatory failure at this time.  . CT showed diffuse bilateral groundglass opacities, no PE . Fever: none . Oxygen requirement: HFNC, NRB . Antibiotics: none . Remdesivir: 1/9 > . Steroids: 1/9 > . Actemra: 1/9  . Vitamin C and Zinc . Proning: recommended as tolerated   Inflammatory markers: . CRP: 12.0> 10.1 . D-dimer: 5.17 > 6.83  . Ferritin 756> . Procalcitonin: 0.34  CAD status post drug-eluting stent PMH --asymptomatic --Continue beta-blocker, statin and aspirin  Aortic atherosclerosis --continue statin  Bilateral renal masses incompletely imaged --Nonemergent CT renal or MRI after acute illness resolved.  Possible lung mass from prior admission.  CTA no abnormalities noted.  Disposition Plan:  Discussion: Appears quite ill, continue current treatments, trial heated high flow.  Nursing reported dark stool.  Will monitor.  ADDENDUM 1600 Patient reevaluated, he appears stable on heated high flow and nonrebreather.  Mentation appears normal.  Speech fluent and clear.  He appears relaxed watching TV.  SPO2 low 90s.  Will continue present therapy.  Status is: Inpatient  Remains inpatient appropriate because:IV treatments appropriate due to intensity of illness or inability to take PO and  Inpatient level of care appropriate due to severity of illness   Dispo: The patient is from: Home              Anticipated d/c is to: TBD              Anticipated d/c date is: > 3 days              Patient currently is not medically stable to d/c.  DVT prophylaxis: enoxaparin (LOVENOX) injection 40 mg Start: 12/25/20 1200    Code Status: Full Code Family Communication: updated wife by telephone  Brendia Sacks, MD  [facility] Direct contact: see [email or web address] (further directions at bottom of note if needed) 7PM-7AM contact night coverage as at bottom of note 12/26/2020, 11:42 AM  LOS: 1 day   Significant Hospital Events   .    Consults:  .    Procedures:  .   Significant Diagnostic Tests:  Marland Kitchen    Micro Data:  .    Antimicrobials:  .   Interval History/Subjective  CC: f/u COVID  Feels very short of breath, feels poorly, unable to take off the mask to eat.  Objective   Vitals:  Vitals:   12/26/20 1114 12/26/20 1123  BP: (!) 152/86   Pulse: 65   Resp: (!) 21   Temp:    SpO2: (S) (!) 86% (!) 89%    Exam:  Constitutional:   . Appears calm, uncomfortable, critically ill, tachypneic ENMT:  . grossly normal hearing  Respiratory:  . CTA bilaterally, no w/r/r.  . Respiratory effort moderately increased, tachypneic, dyspneic. Cardiovascular:  . RRR, no m/r/g . No LE extremity edema   Abdomen:  . Soft Musculoskeletal:  .  RUE, LUE, RLE, LLE   . strength and tone grossly unremarkable Skin:  . No rashes, lesions, ulcers noted Psychiatric:  . Mental status o Mood, affect appropriate  I have personally reviewed the following:   Today's Data  . Complete metabolic panel with modestly elevated AST . CBC unremarkable  Scheduled Meds: . albuterol  2 puff Inhalation TID  . vitamin C  500 mg Oral Daily  . aspirin EC  81 mg Oral Daily  . atorvastatin  20 mg Oral Daily  . azithromycin  250 mg Oral Daily  . carvedilol  25 mg Oral BID WC  . enoxaparin  (LOVENOX) injection  40 mg Subcutaneous Q24H  . famotidine  20 mg Oral Daily  . insulin aspart  0-15 Units Subcutaneous TID WC  . insulin aspart  0-5 Units Subcutaneous QHS  . methylPREDNISolone (SOLU-MEDROL) injection  1 mg/kg Intravenous Q12H  . sodium chloride flush  3 mL Intravenous Q12H  . zinc sulfate  220 mg Oral Daily   Continuous Infusions: . remdesivir 100 mg in NS 100 mL 100 mg (12/26/20 1104)    Active Problems:   CAD- s/p PCI w/ DES to circumflex, and RCA in 2012- patent stents on re-look cath 08/10/13   HTN (hypertension)   HLD (hyperlipidemia)   Acute hypoxemic respiratory failure due to COVID-19 [facility])   Pneumonia due to COVID-19 virus   Aortic atherosclerosis (HCC)   Bilateral renal masses   LOS: 1 day   How to contact the [facility] Attending or Consulting provider 7A - 7P or covering provider during after hours 7P -7A, for this patient?  1. Check the care team in [facility] and look for a) attending/consulting TR[facility] provider listed and b) the [facility] team listed 2. Log into [email or web address] and use [place]'s universal password to access. If you do not have the password, please contact the hospital operator. 3. Locate the [facility] provider you are looking for under [facility] and page to a number that you can be directly reached. 4. If you still have difficulty reaching the provider, please page the [facility] (Director on Call) for the Hospitalists listed on amion for assistance.

## 2020-12-26 NOTE — ED Notes (Signed)
Lunch Tray Ordered 1115. 

## 2020-12-26 NOTE — ED Notes (Signed)
Patient did not tolerated removal of nonrebreather. SPO2 decreased to 86% on 15L HFNC, nonrebreather replaced.SPO2 increased to 95%

## 2020-12-26 NOTE — ED Notes (Signed)
Patient assisted with [facility] use, patient transferred from bed to [facility] independently. Increased respiratory effort noted with minimal activity. Patient now back in bed resting. SpO2 97% on NRB at this time, he denies further needs.

## 2020-12-26 NOTE — ED Notes (Signed)
Dr. Irene Limbo updated via chat that patient is still on heat HFNC and NRB. Dr. Irene Limbo said that it was ok to keep his saturation above 85%.

## 2020-12-26 NOTE — ED Notes (Signed)
Pt assisted to bedside commode where he had a large watery dark green stool. Pt desaturation to 70% with exertion. Lungs clear bilaterally. Pt bed cleaned due to BM accident. Pt cleaned up and placed on stretcher. Pt performing breathing techniques to assist with increase in O2 sat. Pt now up to 92% on high flo and NRB.

## 2020-12-26 NOTE — ED Notes (Addendum)
Dr. Irene Limbo notified that pt is on NRB and HFNC but satting below 90%. Pt was also struggling.

## 2020-12-26 NOTE — ED Notes (Signed)
RT paged.

## 2020-12-26 NOTE — ED Notes (Signed)
SDU Breakfast Ordered 

## 2020-12-26 NOTE — Telephone Encounter (Signed)
Nurse Assessment Nurse: Neta Ehlers, RN, Leah Date/Time (Eastern Time): 01-09-21 7:25:39 PM Confirm and document reason for call. If symptomatic, describe symptoms. ---Caller states pt dc'd from hospital Thursday after COVID, home oxygen was to be delivered however there has not been a delivery at this time. Caller spoke to someone regarding this and the earliest they can have it out is Monday. Caller states that pt's oxygen has been up and down, current O2 sat is 83% on 3 L via concentrator Does the patient have any new or worsening symptoms? ---Yes Will a triage be completed? ---Yes Related visit to physician within the last 2 weeks? ---Yes Does the PT have any chronic conditions? (i.e. diabetes, asthma, this includes High risk factors for pregnancy, etc.) ---Yes List chronic conditions. ---cardiac stents x 2 HTN high cholesterol Is this a behavioral health or substance abuse call? ---No Guidelines Guideline Title Affirmed Question Affirmed Notes Nurse Date/Time (Eastern Time) COVID-19 - Diagnosed or Suspected MODERATE difficulty breathing (e.g., speaks in Reasor, RN, Tacey Ruiz 01-09-2021 7:32:40 PM PLEASE NOTE: All timestamps contained within this report are represented as [place] Standard Time. CONFIDENTIALTY NOTICE: This fax transmission is intended only for the addressee. It contains information that is legally privileged, confidential or otherwise protected from use or disclosure. If you are not the intended recipient, you are strictly prohibited from reviewing, disclosing, copying using or disseminating any of this information or taking any action in reliance on or regarding this information. If you have received this fax in error, please notify us immediately by telephone so that we can arrange for its return to [place]. Phone: [phone], Toll-Free: [phone], Fax: [phone] Page: 2 of 2 Call Id: [id number] Guidelines Guideline Title Affirmed Question Affirmed Notes Nurse Date/Time  Lamount Cohen Time) phrases, SOB even at rest, pulse 100-120) Disp. Time Lamount Cohen Time) Disposition Final User 01/09/21 7:24:07 PM Send to Urgent Jasmine December January 09, 2021 7:36:35 PM Paged On Call back to Call Center Reasor, RN, Tacey Ruiz 09-Jan-2021 7:36:45 PM Go to ED Now Yes Reasor, RN, Tacey Ruiz Caller Disagree/Comply Disagree Caller Understands Yes PreDisposition InappropriateToAsk Care Advice Given Per Guideline GO TO ED NOW: * Leave now. Drive carefully. * It is better and safer if another adult drives instead of you. CARE ADVICE given per COVID-19 - DIAGNOSED OR SUSPECTED (Adult) guideline. Referrals GO TO FACILITY REFUSED Paging DoctorName Phone DateTime Result/Outcome Message Type Notes Kerby Nora - MD [phone] January 09, 2021 7:36:35 PM Paged On Call Back to Call Center Doctor Paged [facility] AccessNurse [phone] Kerby Nora - MD 01/09/2021 7:41:31 PM Spoke with On Call - General Message Result OC MD recommends pt go back to ED

## 2020-12-26 NOTE — ED Notes (Signed)
Dinner Trays Ordered @ 1731. 

## 2020-12-27 ENCOUNTER — Inpatient Hospital Stay (HOSPITAL_COMMUNITY): Payer: Medicare Other

## 2020-12-27 DIAGNOSIS — U071 COVID-19: Secondary | ICD-10-CM

## 2020-12-27 DIAGNOSIS — I10 Essential (primary) hypertension: Secondary | ICD-10-CM

## 2020-12-27 DIAGNOSIS — J9601 Acute respiratory failure with hypoxia: Secondary | ICD-10-CM | POA: Diagnosis present

## 2020-12-27 DIAGNOSIS — I2510 Atherosclerotic heart disease of native coronary artery without angina pectoris: Secondary | ICD-10-CM | POA: Diagnosis not present

## 2020-12-27 DIAGNOSIS — J1282 Pneumonia due to coronavirus disease 2019: Secondary | ICD-10-CM | POA: Diagnosis not present

## 2020-12-27 LAB — COMPREHENSIVE METABOLIC PANEL
ALT: 37 U/L (ref 0–44)
AST: 68 U/L — ABNORMAL HIGH (ref 15–41)
Albumin: 2.8 g/dL — ABNORMAL LOW (ref 3.5–5.0)
Alkaline Phosphatase: 136 U/L — ABNORMAL HIGH (ref 38–126)
Anion gap: 9 (ref 5–15)
BUN: 23 mg/dL (ref 8–23)
CO2: 23 mmol/L (ref 22–32)
Calcium: 9 mg/dL (ref 8.9–10.3)
Chloride: 105 mmol/L (ref 98–111)
Creatinine, Ser: 1.02 mg/dL (ref 0.61–1.24)
GFR, Estimated: >60 mL/min (ref >60)
Glucose, Bld: 174 mg/dL — ABNORMAL HIGH (ref 70–99)
Potassium: 4.3 mmol/L (ref 3.5–5.1)
Sodium: 137 mmol/L (ref 135–145)
Total Bilirubin: 1.4 mg/dL — ABNORMAL HIGH (ref 0.3–1.2)
Total Protein: 6.2 g/dL — ABNORMAL LOW (ref 6.5–8.1)

## 2020-12-27 LAB — CBC WITH DIFFERENTIAL/PLATELET
Abs Immature Granulocytes: 0.07 10*3/uL (ref 0.00–0.07)
Basophils Absolute: 0 10*3/uL (ref 0.0–0.1)
Basophils Relative: 0 %
Eosinophils Absolute: 0 10*3/uL (ref 0.0–0.5)
Eosinophils Relative: 0 %
HCT: 49.2 % (ref 39.0–52.0)
Hemoglobin: 16.9 g/dL (ref 13.0–17.0)
Immature Granulocytes: 1 %
Lymphocytes Relative: 5 %
Lymphs Abs: 0.5 10*3/uL — ABNORMAL LOW (ref 0.7–4.0)
MCH: 30.8 pg (ref 26.0–34.0)
MCHC: 34.3 g/dL (ref 30.0–36.0)
MCV: 89.6 fL (ref 80.0–100.0)
Monocytes Absolute: 0.2 10*3/uL (ref 0.1–1.0)
Monocytes Relative: 2 %
Neutro Abs: 9.3 10*3/uL — ABNORMAL HIGH (ref 1.7–7.7)
Neutrophils Relative %: 92 %
Platelets: 207 10*3/uL (ref 150–400)
RBC: 5.49 MIL/uL (ref 4.22–5.81)
RDW: 12.3 % (ref 11.5–15.5)
WBC: 9.9 10*3/uL (ref 4.0–10.5)
nRBC: 0 % (ref 0.0–0.2)

## 2020-12-27 LAB — I-STAT ARTERIAL BLOOD GAS, ED
Acid-Base Excess: 2 mmol/L (ref 0.0–2.0)
Acid-Base Excess: 0 mmol/L (ref 0.0–2.0)
Bicarbonate: 25.8 mmol/L (ref 20.0–28.0)
Bicarbonate: 25.6 mmol/L (ref 20.0–28.0)
Calcium, Ion: 1.25 mmol/L (ref 1.15–1.40)
Calcium, Ion: 1.21 mmol/L (ref 1.15–1.40)
HCT: 47 % (ref 39.0–52.0)
HCT: 47 % (ref 39.0–52.0)
Hemoglobin: 16 g/dL (ref 13.0–17.0)
Hemoglobin: 16 g/dL (ref 13.0–17.0)
O2 Saturation: 90 %
O2 Saturation: 88 %
Patient temperature: 98
Patient temperature: 97.7
Potassium: 4.1 mmol/L (ref 3.5–5.1)
Potassium: 4.3 mmol/L (ref 3.5–5.1)
Sodium: 139 mmol/L (ref 135–145)
Sodium: 139 mmol/L (ref 135–145)
TCO2: 27 mmol/L (ref 22–32)
TCO2: 27 mmol/L (ref 22–32)
pCO2 arterial: 35.4 mmHg (ref 32.0–48.0)
pCO2 arterial: 43.5 mmHg (ref 32.0–48.0)
pH, Arterial: 7.469 — ABNORMAL HIGH (ref 7.350–7.450)
pH, Arterial: 7.375 (ref 7.350–7.450)
pO2, Arterial: 53 mmHg — ABNORMAL LOW (ref 83.0–108.0)
pO2, Arterial: 55 mmHg — ABNORMAL LOW (ref 83.0–108.0)

## 2020-12-27 LAB — CBC
HCT: 49 % (ref 39.0–52.0)
Hemoglobin: 17.1 g/dL — ABNORMAL HIGH (ref 13.0–17.0)
MCH: 31.4 pg (ref 26.0–34.0)
MCHC: 34.9 g/dL (ref 30.0–36.0)
MCV: 90.1 fL (ref 80.0–100.0)
Platelets: 291 10*3/uL (ref 150–400)
RBC: 5.44 MIL/uL (ref 4.22–5.81)
RDW: 12.9 % (ref 11.5–15.5)
WBC: 17.9 10*3/uL — ABNORMAL HIGH (ref 4.0–10.5)
nRBC: 0 % (ref 0.0–0.2)

## 2020-12-27 LAB — TROPONIN I (HIGH SENSITIVITY)
Troponin I (High Sensitivity): 1735 ng/L (ref <18)
Troponin I (High Sensitivity): 2258 ng/L (ref <18)

## 2020-12-27 LAB — MAGNESIUM: Magnesium: 2.1 mg/dL (ref 1.7–2.4)

## 2020-12-27 LAB — BRAIN NATRIURETIC PEPTIDE: B Natriuretic Peptide: 250.1 pg/mL — ABNORMAL HIGH (ref 0.0–100.0)

## 2020-12-27 LAB — GLUCOSE, CAPILLARY
Glucose-Capillary: 195 mg/dL — ABNORMAL HIGH (ref 70–99)
Glucose-Capillary: 209 mg/dL — ABNORMAL HIGH (ref 70–99)
Glucose-Capillary: 271 mg/dL — ABNORMAL HIGH (ref 70–99)

## 2020-12-27 LAB — HEPARIN LEVEL (UNFRACTIONATED): Heparin Unfractionated: 0.11 IU/mL — ABNORMAL LOW (ref 0.30–0.70)

## 2020-12-27 LAB — CREATININE, SERUM
Creatinine, Ser: 1.73 mg/dL — ABNORMAL HIGH (ref 0.61–1.24)
GFR, Estimated: 42 mL/min — ABNORMAL LOW (ref >60)

## 2020-12-27 LAB — HEMOGLOBIN A1C
Hgb A1c MFr Bld: 6.5 % — ABNORMAL HIGH (ref 4.8–5.6)
Mean Plasma Glucose: 139.85 mg/dL

## 2020-12-27 LAB — PHOSPHORUS: Phosphorus: 3 mg/dL (ref 2.5–4.6)

## 2020-12-27 LAB — D-DIMER, QUANTITATIVE: D-Dimer, Quant: >20 ug/mL-FEU — ABNORMAL HIGH (ref 0.00–0.50)

## 2020-12-27 LAB — C-REACTIVE PROTEIN: CRP: 6.1 mg/dL — ABNORMAL HIGH (ref <1.0)

## 2020-12-27 LAB — MRSA PCR SCREENING: MRSA by PCR: NEGATIVE

## 2020-12-27 LAB — FERRITIN: Ferritin: 840 ng/mL — ABNORMAL HIGH (ref 24–336)

## 2020-12-27 MED ORDER — ASCORBIC ACID 500 MG PO TABS
500.0000 mg | ORAL_TABLET | Freq: Every day | ORAL | Status: DC
  Administered 2020-12-28 – 2021-01-05 (×8): 500 mg
  Filled 2020-12-27 (×8): qty 1

## 2020-12-27 MED ORDER — FAMOTIDINE 20 MG PO TABS: 20.0000 mg | ORAL_TABLET | Freq: Two times a day (BID) | ORAL | Status: DC

## 2020-12-27 MED ORDER — ATORVASTATIN CALCIUM 10 MG PO TABS
20.0000 mg | ORAL_TABLET | Freq: Every day | ORAL | Status: DC
  Administered 2020-12-28 – 2021-01-05 (×8): 20 mg
  Filled 2020-12-27 (×8): qty 2

## 2020-12-27 MED ORDER — MELATONIN 3 MG PO TABS: 3.0000 mg | ORAL_TABLET | Freq: Every day | ORAL | Status: DC

## 2020-12-27 MED ORDER — NOREPINEPHRINE 4 MG/250ML-% IV SOLN
0.0000 ug/min | INTRAVENOUS | Status: DC
  Administered 2020-12-27: 2 ug/min via INTRAVENOUS
  Administered 2020-12-28: 5 ug/min via INTRAVENOUS
  Administered 2020-12-28: 7 ug/min via INTRAVENOUS
  Administered 2020-12-29: 2 ug/min via INTRAVENOUS
  Filled 2020-12-27 (×3): qty 250

## 2020-12-27 MED ORDER — HEPARIN BOLUS VIA INFUSION
4000.0000 [IU] | Freq: Once | INTRAVENOUS | Status: AC
  Administered 2020-12-27: 4000 [IU] via INTRAVENOUS
  Filled 2020-12-27: qty 4000

## 2020-12-27 MED ORDER — POLYETHYLENE GLYCOL 3350 17 G PO PACK: 17.0000 g | PACK | Freq: Every day | ORAL | Status: DC | PRN

## 2020-12-27 MED ORDER — DOCUSATE SODIUM 50 MG/5ML PO LIQD
100.0000 mg | Freq: Two times a day (BID) | ORAL | Status: DC
  Administered 2020-12-28 – 2021-01-05 (×9): 100 mg
  Filled 2020-12-27 (×12): qty 10

## 2020-12-27 MED ORDER — INSULIN ASPART 100 UNIT/ML ~~LOC~~ SOLN
0.0000 [IU] | SUBCUTANEOUS | Status: DC
  Administered 2020-12-27: 3 [IU] via SUBCUTANEOUS
  Administered 2020-12-27: 8 [IU] via SUBCUTANEOUS
  Administered 2020-12-28: 5 [IU] via SUBCUTANEOUS
  Administered 2020-12-28: 8 [IU] via SUBCUTANEOUS

## 2020-12-27 MED ORDER — CARVEDILOL 25 MG PO TABS
25.0000 mg | ORAL_TABLET | Freq: Two times a day (BID) | ORAL | Status: DC
  Filled 2020-12-27: qty 1

## 2020-12-27 MED ORDER — CHLORHEXIDINE GLUCONATE 0.12% ORAL RINSE (MEDLINE KIT)
15.0000 mL | Freq: Two times a day (BID) | OROMUCOSAL | Status: DC
  Administered 2020-12-27 – 2021-01-03 (×14): 15 mL via OROMUCOSAL

## 2020-12-27 MED ORDER — ONDANSETRON HCL 4 MG PO TABS: 4.0000 mg | ORAL_TABLET | Freq: Four times a day (QID) | ORAL | Status: DC | PRN

## 2020-12-27 MED ORDER — AZITHROMYCIN 500 MG PO TABS
250.0000 mg | ORAL_TABLET | Freq: Every day | ORAL | Status: AC
  Administered 2020-12-28: 250 mg
  Filled 2020-12-27: qty 1

## 2020-12-27 MED ORDER — ACETAMINOPHEN 325 MG PO TABS
650.0000 mg | ORAL_TABLET | Freq: Four times a day (QID) | ORAL | Status: DC | PRN
  Administered 2021-01-04 – 2021-01-05 (×2): 650 mg
  Filled 2020-12-27 (×2): qty 2

## 2020-12-27 MED ORDER — MIDAZOLAM HCL 2 MG/2ML IJ SOLN
2.0000 mg | Freq: Once | INTRAMUSCULAR | Status: AC
  Administered 2020-12-27: 2 mg via INTRAVENOUS
  Filled 2020-12-27: qty 2

## 2020-12-27 MED ORDER — ARTIFICIAL TEARS OPHTHALMIC OINT
1.0000 "application " | TOPICAL_OINTMENT | Freq: Three times a day (TID) | OPHTHALMIC | Status: DC
  Administered 2020-12-27 – 2021-01-01 (×10): 1 via OPHTHALMIC
  Filled 2020-12-27 (×4): qty 3.5

## 2020-12-27 MED ORDER — FUROSEMIDE 10 MG/ML IJ SOLN
80.0000 mg | Freq: Once | INTRAMUSCULAR | Status: AC
  Administered 2020-12-27: 80 mg via INTRAVENOUS
  Filled 2020-12-27: qty 8

## 2020-12-27 MED ORDER — MORPHINE SULFATE (PF) 2 MG/ML IV SOLN
1.0000 mg | Freq: Once | INTRAVENOUS | Status: AC
  Administered 2020-12-27: 1 mg via INTRAVENOUS
  Filled 2020-12-27: qty 1

## 2020-12-27 MED ORDER — NITROGLYCERIN IN D5W 200-5 MCG/ML-% IV SOLN
0.0000 ug/min | INTRAVENOUS | Status: DC
  Administered 2020-12-27: 5 ug/min via INTRAVENOUS
  Filled 2020-12-27: qty 250

## 2020-12-27 MED ORDER — VITAL HIGH PROTEIN PO LIQD
1000.0000 mL | ORAL | Status: DC
  Administered 2020-12-27 – 2021-01-04 (×9): 1000 mL
  Filled 2020-12-27: qty 1000

## 2020-12-27 MED ORDER — LORAZEPAM 2 MG/ML IJ SOLN
0.5000 mg | Freq: Once | INTRAMUSCULAR | Status: AC | PRN
  Administered 2020-12-27: 0.5 mg via INTRAVENOUS
  Filled 2020-12-27: qty 1

## 2020-12-27 MED ORDER — HEPARIN BOLUS VIA INFUSION
2800.0000 [IU] | Freq: Once | INTRAVENOUS | Status: AC
  Administered 2020-12-27: 2800 [IU] via INTRAVENOUS
  Filled 2020-12-27: qty 2800

## 2020-12-27 MED ORDER — FENTANYL BOLUS VIA INFUSION
25.0000 ug | INTRAVENOUS | Status: DC | PRN
  Administered 2020-12-27 – 2020-12-30 (×6): 25 ug via INTRAVENOUS
  Filled 2020-12-27: qty 25

## 2020-12-27 MED ORDER — HEPARIN (PORCINE) 25000 UT/250ML-% IV SOLN
1200.0000 [IU]/h | INTRAVENOUS | Status: DC
  Administered 2020-12-27: 1150 [IU]/h via INTRAVENOUS
  Administered 2020-12-27 – 2020-12-29 (×3): 1450 [IU]/h via INTRAVENOUS
  Administered 2020-12-30: 1200 [IU]/h via INTRAVENOUS
  Filled 2020-12-27 (×7): qty 250

## 2020-12-27 MED ORDER — FENTANYL CITRATE (PF) 100 MCG/2ML IJ SOLN
100.0000 ug | Freq: Once | INTRAMUSCULAR | Status: AC
  Administered 2020-12-27: 100 ug via INTRAVENOUS
  Filled 2020-12-27: qty 2

## 2020-12-27 MED ORDER — ALBUTEROL SULFATE (2.5 MG/3ML) 0.083% IN NEBU
2.5000 mg | INHALATION_SOLUTION | Freq: Three times a day (TID) | RESPIRATORY_TRACT | Status: DC
  Administered 2020-12-27 – 2020-12-29 (×5): 2.5 mg via RESPIRATORY_TRACT
  Filled 2020-12-27 (×5): qty 3

## 2020-12-27 MED ORDER — PROPOFOL 1000 MG/100ML IV EMUL
0.0000 ug/kg/min | INTRAVENOUS | Status: DC
  Administered 2020-12-27: 5 ug/kg/min via INTRAVENOUS
  Filled 2020-12-27: qty 100

## 2020-12-27 MED ORDER — ORAL CARE MOUTH RINSE
15.0000 mL | OROMUCOSAL | Status: DC
  Administered 2020-12-27 – 2021-01-03 (×66): 15 mL via OROMUCOSAL

## 2020-12-27 MED ORDER — LACTATED RINGERS IV SOLN: INTRAVENOUS | Status: DC

## 2020-12-27 MED ORDER — MIDAZOLAM HCL 2 MG/2ML IJ SOLN: 2.0000 mg | Freq: Once | INTRAMUSCULAR | Status: DC

## 2020-12-27 MED ORDER — PROPOFOL 1000 MG/100ML IV EMUL
0.0000 ug/kg/min | INTRAVENOUS | Status: DC
  Administered 2020-12-27 – 2020-12-28 (×4): 50 ug/kg/min via INTRAVENOUS
  Filled 2020-12-27 (×4): qty 100

## 2020-12-27 MED ORDER — FAMOTIDINE 20 MG PO TABS
20.0000 mg | ORAL_TABLET | Freq: Every day | ORAL | Status: DC
  Administered 2020-12-27 – 2021-01-04 (×9): 20 mg
  Filled 2020-12-27 (×9): qty 1

## 2020-12-27 MED ORDER — VECURONIUM BROMIDE 10 MG IV SOLR
0.1000 mg/kg | INTRAVENOUS | Status: DC | PRN
  Administered 2020-12-27 – 2020-12-28 (×4): 8.5 mg via INTRAVENOUS
  Filled 2020-12-27 (×4): qty 10

## 2020-12-27 MED ORDER — CHLORHEXIDINE GLUCONATE CLOTH 2 % EX PADS
6.0000 | MEDICATED_PAD | Freq: Every day | CUTANEOUS | Status: DC
  Administered 2020-12-27 – 2021-01-05 (×11): 6 via TOPICAL

## 2020-12-27 MED ORDER — ZINC SULFATE 220 (50 ZN) MG PO CAPS
220.0000 mg | ORAL_CAPSULE | Freq: Every day | ORAL | Status: DC
  Administered 2020-12-28 – 2021-01-05 (×8): 220 mg
  Filled 2020-12-27 (×8): qty 1

## 2020-12-27 MED ORDER — ASPIRIN 81 MG PO CHEW
81.0000 mg | CHEWABLE_TABLET | Freq: Every day | ORAL | Status: DC
  Administered 2020-12-28 – 2021-01-05 (×8): 81 mg
  Filled 2020-12-27 (×9): qty 1

## 2020-12-27 MED ORDER — LACTATED RINGERS IV BOLUS
250.0000 mL | Freq: Once | INTRAVENOUS | Status: AC
  Administered 2020-12-27: 250 mL via INTRAVENOUS

## 2020-12-27 MED ORDER — DOCUSATE SODIUM 50 MG/5ML PO LIQD
100.0000 mg | Freq: Two times a day (BID) | ORAL | Status: DC | PRN
  Filled 2020-12-27: qty 10

## 2020-12-27 MED ORDER — ONDANSETRON HCL 4 MG/2ML IJ SOLN: 4.0000 mg | Freq: Four times a day (QID) | INTRAMUSCULAR | Status: DC | PRN

## 2020-12-27 MED ORDER — METHYLPREDNISOLONE SODIUM SUCC 125 MG IJ SOLR
1.0000 mg/kg | Freq: Two times a day (BID) | INTRAMUSCULAR | Status: DC
  Administered 2020-12-27 – 2020-12-30 (×6): 106.875 mg via INTRAVENOUS
  Filled 2020-12-27 (×6): qty 2

## 2020-12-27 MED ORDER — MELATONIN 3 MG PO TABS
3.0000 mg | ORAL_TABLET | Freq: Every day | ORAL | Status: DC
  Administered 2020-12-27 – 2021-01-02 (×7): 3 mg
  Filled 2020-12-27 (×8): qty 1

## 2020-12-27 MED ORDER — FENTANYL CITRATE (PF) 100 MCG/2ML IJ SOLN: 25.0000 ug | Freq: Once | INTRAMUSCULAR | Status: DC

## 2020-12-27 MED ORDER — ETOMIDATE 2 MG/ML IV SOLN
20.0000 mg | Freq: Once | INTRAVENOUS | Status: AC
  Administered 2020-12-27: 20 mg via INTRAVENOUS

## 2020-12-27 MED ORDER — NOREPINEPHRINE 4 MG/250ML-% IV SOLN: 0.0000 ug/min | INTRAVENOUS | Status: DC

## 2020-12-27 MED ORDER — FENTANYL 2500MCG IN NS 250ML (10MCG/ML) PREMIX INFUSION
25.0000 ug/h | INTRAVENOUS | Status: DC
  Administered 2020-12-28: 150 ug/h via INTRAVENOUS
  Administered 2020-12-28: 100 ug/h via INTRAVENOUS
  Administered 2020-12-29: 175 ug/h via INTRAVENOUS
  Administered 2020-12-29: 300 ug/h via INTRAVENOUS
  Administered 2020-12-29: 200 ug/h via INTRAVENOUS
  Administered 2020-12-29 – 2020-12-30 (×3): 300 ug/h via INTRAVENOUS
  Administered 2020-12-30: 225 ug/h via INTRAVENOUS
  Administered 2020-12-31: 300 ug/h via INTRAVENOUS
  Administered 2020-12-31: 250 ug/h via INTRAVENOUS
  Administered 2020-12-31: 300 ug/h via INTRAVENOUS
  Administered 2021-01-01: 250 ug/h via INTRAVENOUS
  Administered 2021-01-01 – 2021-01-02 (×2): 200 ug/h via INTRAVENOUS
  Filled 2020-12-27 (×14): qty 250

## 2020-12-27 MED ORDER — FENTANYL 2500MCG IN NS 250ML (10MCG/ML) PREMIX INFUSION
25.0000 ug/h | INTRAVENOUS | Status: DC
  Administered 2020-12-27: 25 ug/h via INTRAVENOUS
  Filled 2020-12-27: qty 250

## 2020-12-27 MED ORDER — PROSOURCE TF PO LIQD
45.0000 mL | Freq: Two times a day (BID) | ORAL | Status: DC
  Administered 2020-12-27 – 2020-12-28 (×3): 45 mL
  Filled 2020-12-27 (×5): qty 45

## 2020-12-27 MED ORDER — DOCUSATE SODIUM 100 MG PO CAPS: 100.0000 mg | ORAL_CAPSULE | Freq: Two times a day (BID) | ORAL | Status: DC | PRN

## 2020-12-27 MED ORDER — LABETALOL HCL 5 MG/ML IV SOLN
10.0000 mg | Freq: Once | INTRAVENOUS | Status: AC
  Administered 2020-12-27: 10 mg via INTRAVENOUS
  Filled 2020-12-27: qty 4

## 2020-12-27 MED ORDER — POLYETHYLENE GLYCOL 3350 17 G PO PACK
17.0000 g | PACK | Freq: Every day | ORAL | Status: DC
  Administered 2020-12-28 – 2021-01-05 (×4): 17 g
  Filled 2020-12-27 (×4): qty 1

## 2020-12-27 MED ORDER — FENTANYL BOLUS VIA INFUSION
25.0000 ug | INTRAVENOUS | Status: DC | PRN
  Filled 2020-12-27: qty 25

## 2020-12-27 MED ORDER — VECURONIUM BOLUS VIA INFUSION: 10.0000 mg | Freq: Once | INTRAVENOUS | Status: DC

## 2020-12-27 NOTE — Progress Notes (Signed)
NAME:  Mario Torres, MRN:  [id number], DOB:  02-14-1951, LOS: 2 ADMISSION DATE:  2021-01-16, CONSULTATION DATE:  12/28/2019 REFERRING MD:  Dr Loney Loh, CHIEF COMPLAINT:  Worsening shortness of breath and hypoxia  Brief History:  70 year old with hypertension hyperlipidemia unvaccinated presented with COVID-pneumonia now with acute hypoxic respiratory failure  History of Present Illness:  70 year old male with hypertension hyperlipidemia and coronary artery disease who was diagnosed with COVID pneumonia on 1/3 after he presented with shortness of breath, he was hospitalized from 1/3-1/6.  He was treated with a steroid and remdesivir and was discharged home on 3 L oxygen, came back on 1/8 with worsening shortness of breath, cough and diarrhea.  He is being managed under hospitalist care, his oxygen requirement is slowly going up, currently on high flow nasal cannula with nonrebreather with O2 sat ranging between 88 to 92%.  X-ray chest was repeated tonight which shows worsening bilateral infiltrate so PCCM consult was requested for evaluation. Patient denies complaint of chest pain, dysuria, fever, chills, nausea and vomiting.  Past Medical History:  Hyperlipidemia Hypertension Coronary artery disease  Significant Hospital Events:  Endotracheal intubation 12/27/2020 Right IJ placement 12/27/2020  Consults:    Procedures:  Right IJ Endotracheal intubation  Significant Diagnostic Tests:  1/9 CT angiogram of chest: Diffuse bilateral ground-glass airspace opacities. Differential includes atypical pneumonias such as viral or fungal, interstitial pneumonias, edema related to volume overload/CHF, chronic interstitial diseases, hypersensitivity pneumonitis, and respiratory bronchiolitis. Favor diffuse bilateral COVID-related pneumonia. 2. No pulmonary embolism, with mild study limitations detailed above. 3. Cardiomegaly. Three-vessel coronary artery calcifications. 4. Bilateral renal  masses, incompletely imaged, presumed cysts. Consider nonemergent follow-up with renal CT or MRI after current pulmonary issues are resolved  1/11: X-ray chest reviewed by me showing worsening bilateral infiltrates left more than right  Micro Data:  SARS coronavirus positive  Antimicrobials:  Azithromycin 1/9 > Remdesivir 1/9 > Tocilizumab 1/9  Interim History / Subjective:  Patient continued to deteriorate Intubated in the emergency department  Objective   Blood pressure (!) 204/144, pulse 95, temperature 97.7 F (36.5 C), temperature source Oral, resp. rate (!) 30, height 5\' 9"  (1.753 m), weight 106.6 kg, SpO2 92 %.    Vent Mode: PRVC FiO2 (%):  [90 %-100 %] 100 % Set Rate:  [30 bmp] 30 bmp Vt Set:  [420 mL] 420 mL PEEP:  [14 cmH20] 14 cmH20 Plateau Pressure:  [30 cmH20] 30 cmH20   Intake/Output Summary (Last 24 hours) at 12/27/2020 1018 Last data filed at 12/27/2020 0500 Gross per 24 hour  Intake -  Output 1100 ml  Net -1100 ml   Filed Weights   12/27/20 0400  Weight: 106.6 kg    Examination: General: Elderly, does not appear to be an extremis, on vent HENT: Size 8 endotracheal tube in place Lungs: Rales bibasilarly Cardiovascular: S1-S2 appreciated Abdomen: Soft, bowel sounds appreciated Extremities: No clubbing, no edema Neuro: Sedated GU:   Resolved Hospital Problem list     Assessment & Plan:  Acute hypoxemic respiratory failure secondary to COVID-19 pneumonia Continue mechanical ventilation per ARDS protocol Target TVol 6-8cc/kgIBW Target Plateau Pressure < 30cm H20 Target driving pressure less than 15 cm of water Target PaO2 55-65: titrate PEEP/FiO2 per protocol As long as PaO2 to FiO2 ratio is less than 1:150 position in prone position for 16 hours a day Check CVP daily if CVL in place Target CVP less than 4, diurese as necessary Ventilator associated pneumonia prevention protocol  Prone Intermittent paralytics  Remdesivir IV  steroids Status post to tocilizumab  Uncontrolled hypertension Chest pain Elevated troponin -On nitroglycerin drip -Full dose anticoagulation -Elevated troponin  Hyperlipidemia  SSI  Best practice (evaluated daily)  Diet: Tube feeding per protocol Pain/Anxiety/Delirium protocol (if indicated): Fentanyl, propofol VAP protocol (if indicated): In place, DVT prophylaxis: On full dose anticoagulation GI prophylaxis: Pepcid Glucose control: SSI Mobility: Bedrest Disposition: Full code  Significant other updated  Goals of Care:  Last date of multidisciplinary goals of care discussion:1/11 Family and staff present: Patient's wife, daughter over the phone Summary of discussion:Continue full aggressive care even if it requires  Follow up goals of care discussion due: 1/18 Code Status: Full code  Labs   CBC: Recent Labs  Lab 12/21/20 0859 12/22/20 0435 01/01/2021 2156 12/26/20 0500 12/27/20 0207 12/27/20 0211  WBC 5.5 6.7 8.8 6.6  --  9.9  NEUTROABS  --   --   --  5.6  --  9.3*  HGB 16.6 15.8 16.3 16.7 16.0 16.9  HCT 47.9 44.8 48.4 47.5 47.0 49.2  MCV 90.5 90.0 90.5 90.6  --  89.6  PLT 101* 114* 168 191  --  207    Basic Metabolic Panel: Recent Labs  Lab 12/21/20 0859 12/22/20 0435 12/31/2020 2156 12/26/20 0500 12/27/20 0207 12/27/20 0211  NA 133* 137 136 137 139 137  K 4.3 4.3 4.4 4.0 4.1 4.3  CL 102 104 101 102  --  105  CO2 17* 22 24 24   --  23  GLUCOSE 206* 192* 162* 125*  --  174*  BUN 38* 30* 18 26*  --  23  CREATININE 1.61* 1.32* 1.14 1.11  --  1.02  CALCIUM 9.1 8.7* 9.2 8.8*  --  9.0  MG  --   --   --  2.2  --  2.1  PHOS  --   --   --  3.1  --  3.0   GFR: Estimated Creatinine Clearance: 82.3 mL/min (by C-G formula based on SCr of 1.02 mg/dL). Recent Labs  Lab 12/22/20 0435 01/02/2021 2156 12/25/20 1247 12/26/20 0500 12/27/20 0211  PROCALCITON  --   --  0.34  --   --   WBC 6.7 8.8  --  6.6 9.9    Liver Function Tests: Recent Labs  Lab  12/21/20 0859 12/22/20 0435 12/25/20 1247 12/26/20 0500 12/27/20 0211  AST 55* 77* 47* 58* 68*  ALT 29 36 32 35 37  ALKPHOS 41 41 47 69 136*  BILITOT 0.9 1.0 1.5* 1.1 1.4*  PROT 6.4* 6.1* 6.4* 5.9* 6.2*  ALBUMIN 3.3* 3.2* 2.8* 2.7* 2.8*   No results for input(s): LIPASE, AMYLASE in the last 168 hours. No results for input(s): AMMONIA in the last 168 hours.  ABG    Component Value Date/Time   PHART 7.469 (H) 12/27/2020 0207   PCO2ART 35.4 12/27/2020 0207   PO2ART 53 (L) 12/27/2020 0207   HCO3 25.8 12/27/2020 0207   TCO2 27 12/27/2020 0207   O2SAT 90.0 12/27/2020 0207     Coagulation Profile: No results for input(s): INR, PROTIME in the last 168 hours.  Cardiac Enzymes: No results for input(s): CKTOTAL, CKMB, CKMBINDEX, TROPONINI in the last 168 hours.  HbA1C: Hgb A1c MFr Bld  Date/Time Value Ref Range Status  12/27/2020 02:49 AM 6.5 (H) 4.8 - 5.6 % Final    Comment:    (NOTE) Pre diabetes:          5.7%-6.4%  Diabetes:              >  6.4%  Glycemic control for   <7.0% adults with diabetes   08/07/2013 02:52 PM 5.4 <5.7 % Final    Comment:    (NOTE)                                                                       According to the ADA Clinical Practice Recommendations for 2011, when HbA1c is used as a screening test:  >=6.5%   Diagnostic of Diabetes Mellitus           (if abnormal result is confirmed) 5.7-6.4%   Increased risk of developing Diabetes Mellitus References:Diagnosis and Classification of Diabetes Mellitus,Diabetes Care,2011,34(Suppl 1):S62-S69 and Standards of Medical Care in         Diabetes - 2011,Diabetes Care,2011,34 (Suppl 1):S11-S61.    CBG: Recent Labs  Lab 12/26/20 2155  GLUCAP 195*    Review of Systems:   reviewed  Past Medical History:  He,  has a past medical history of Bilateral renal masses (12/26/2020), Hyperlipidemia, Hypertension, and MI (myocardial infarction) (HCC).   Surgical History:   Past Surgical History:   Procedure Laterality Date  . CARDIAC CATHETERIZATION  02/06/2011   normal LV; CAD 40% mid left  LADafter thsecond diagonal;90-95% culprit stenosis prox.circ ;80-90% stenosis prox RCA.  Marland Kitchen CARDIAC CATHETERIZATION  11/24 /2003   normal cors  . CORONARY ANGIOPLASTY  02/06/2011   large dominant left circ sytem treated w/cutting balloon arthrotomy,stenting with 3.5 x 16 mm eerolimus Promus stent,postdilated to 4.0 mm  . CORONARY STENT PLACEMENT    . DOPPLER ECHOCARDIOGRAPHY  02/07/2011   EF 50 to 55%  mild -mod posterior wall hypokinesis,pulm vein flow pattern normal    . EVALUATION UNDER ANESTHESIA WITH HEMORRHOIDECTOMY N/A 07/31/2019   Procedure: EXAM UNDER ANESTHESIA WITH RUBBER BAND LIGATION OF INTERNAL HEMORRHOIDS;  Surgeon: Darnell Level, MD;  Location: [facility];  Service: General;  Laterality: N/A;  . FISSURECTOMY N/A 07/31/2019   Procedure: FISSURECTOMY;  Surgeon: Darnell Level, MD;  Location: [facility];  Service: General;  Laterality: N/A;  . KNEE SURGERY    . LEFT HEART CATHETERIZATION WITH CORONARY ANGIOGRAM N/A 08/10/2013   Procedure: LEFT HEART CATHETERIZATION WITH CORONARY ANGIOGRAM;  Surgeon: Lennette Bihari, MD;  Location: [facility] CATH [facility];  Service: Cardiovascular;  Laterality: N/A;  . NM MYOCAR PERF WALL MOTION  02/19/2012   EF 61% ,NO SIGNIFICANT WALL MOTION ABNORMALITIES NOTED  . SPHINCTEROTOMY N/A 07/31/2019   Procedure: LATERAL INTERNAL SPHINCTEROTOMY;  Surgeon: Darnell Level, MD;  Location: [facility];  Service: General;  Laterality: N/A;  . VASECTOMY       Social History:   reports that he quit smoking about 47 years ago. He has never used smokeless tobacco. He reports that he does not drink alcohol and does not use drugs.   Family History:  His family history includes Alcohol abuse in his father; Cirrhosis in his father; Hypertension in his father; Stomach cancer in his father. There is no history of Prostate cancer or Colon cancer.    Allergies Allergies  Allergen Reactions  . Crestor [Rosuvastatin Calcium] Other (See Comments)    Joint pain, ankle pain      Home Medications  Prior to Admission medications   Medication  Sig Start Date End Date Taking? Authorizing Provider  acetaminophen (TYLENOL) 500 MG tablet Take 500 mg by mouth every 6 (six) hours as needed for mild pain.   Yes [provider]  Ascorbic Acid (VITAMIN C) 1000 MG tablet Take 1,000 mg by mouth daily.   Yes [provider]  aspirin EC 81 MG tablet Take 81 mg by mouth daily.   Yes [provider]  atorvastatin (LIPITOR) 20 MG tablet TAKE 1 TABLET(20 MG) BY MOUTH DAILY Patient taking differently: Take 20 mg by mouth daily. 11/01/20  Yes Lennette Bihari, MD  benzonatate (TESSALON) 100 MG capsule Take 1 capsule (100 mg total) by mouth 3 (three) times daily as needed for cough. 12/22/20  Yes Osvaldo Shipper, MD  carvedilol (COREG) 25 MG tablet TAKE 1 TABLET(25 MG) BY MOUTH TWICE DAILY WITH A MEAL Patient taking differently: Take 25 mg by mouth 2 (two) times daily with a meal. 11/01/20  Yes Lennette Bihari, MD  cholecalciferol (VITAMIN D) 25 MCG (1000 UNIT) tablet Take 1,000 Units by mouth daily.   Yes [provider]  famotidine (PEPCID) 20 MG tablet Take 1 tablet (20 mg total) by mouth daily for 10 days. 12/23/20 01/02/21 Yes Osvaldo Shipper, MD  Multiple Vitamin (MULTIVITAMIN) tablet Take 1 tablet by mouth daily.   Yes [provider]  nitroGLYCERIN (NITROSTAT) 0.4 MG SL tablet Place 1 tablet (0.4 mg total) under the tongue every 5 (five) minutes x 3 doses as needed for chest pain. 03/31/20  Yes Meng, Wynema Birch, PA  Omega-3 Fatty Acids (FISH OIL) 1000 MG CAPS Take 1 capsule (1,000 mg total) by mouth daily. 11/18/18  Yes Azalee Course, PA  ondansetron (ZOFRAN) 4 MG tablet Take 1 tablet (4 mg total) by mouth every 8 (eight) hours as needed for nausea or vomiting. 12/19/20  Yes Terressa Koyanagi, DO  predniSONE (DELTASONE) 20 MG tablet  Take 3 tablets once daily for 3 days followed by 2 tablets once daily for 3 days followed by 1 tablet once daily for 3 days and then stop 12/22/20  Yes Osvaldo Shipper, MD  shark liver oil-cocoa butter (PREPARATION H) 0.25-3-85.5 % suppository Place 1 suppository rectally as needed for hemorrhoids.   Yes [provider]  azithromycin (ZITHROMAX) 250 MG tablet Day 1 take 2 pills, days 2-5 take 1 pill. Patient taking differently: Take 250 mg by mouth See admin instructions. Day 1 take 2 pills, days 2-5 take 1 pill. 12/23/20   Myrlene Broker, MD     The patient is critically ill with multiple organ systems failure and requires high complexity decision making for assessment and support, frequent evaluation and titration of therapies, application of advanced monitoring technologies and extensive interpretation of multiple databases. Critical Care Time devoted to patient care services described in this note independent of APP/resident time (if applicable)  is 45 minutes.   Virl Diamond MD [facility] Personal pager: [phone] If unanswered, please page CCM On-call: #[phone]

## 2020-12-27 NOTE — ED Notes (Signed)
Pt called this RN into room due to diff breathing and having a possible panic attack and asking for help with his anxiety. Pt appears tense and is tachypneic to 30's. Pt lung sounds are clear at this time. Will page provider.

## 2020-12-27 NOTE — Progress Notes (Signed)
Pt attempting to pull lines and tubes. Restraints applied for pt protection.

## 2020-12-27 NOTE — ED Notes (Signed)
Pt heated high flow bumped from 45L to 50L per MD. Also increased fio2 from 90 to 100%.

## 2020-12-27 NOTE — ED Notes (Signed)
Date and time results received: 12/27/20 0407  Test: tROPONIN Critical Value: 1735  Name of Provider Notified: MD Rathore  Orders Received? Or Actions Taken?: Awaiting orders

## 2020-12-27 NOTE — ED Notes (Signed)
MD at bedside. 

## 2020-12-27 NOTE — ED Notes (Signed)
Attempted to call report

## 2020-12-27 NOTE — ED Notes (Signed)
Lunch Tray Ordered @ 1107. 

## 2020-12-27 NOTE — Procedures (Signed)
Intubation Procedure Note  GUNTER CONDE  [id number]  1951-01-04  Date:12/27/20  Time:9:58 AM   Provider Performing:Admir Candelas A Soma Bachand    Procedure: Intubation (31500)  Indication(s) Respiratory Failure  Consent Risks of the procedure as well as the alternatives and risks of each were explained to the patient and/or caregiver.  Consent for the procedure was obtained and is signed in the bedside chart   Anesthesia Etomidate, Versed, Fentanyl and Rocuronium   Time Out Verified patient identification, verified procedure, site/side was marked, verified correct patient position, special equipment/implants available, medications/allergies/relevant history reviewed, required imaging and test results available.   Sterile Technique Usual hand hygeine, masks, and gloves were used   Procedure Description Patient positioned in bed supine.  Sedation given as noted above.  Patient was intubated with endotracheal tube using Glidescope.  View was Grade 1 full glottis .  Number of attempts was 1.  Colorimetric CO2 detector was consistent with tracheal placement.   Complications/Tolerance None; patient tolerated the procedure well. Chest X-ray is ordered to verify placement.   EBL none   Specimen(s) None

## 2020-12-27 NOTE — Progress Notes (Signed)
ANTICOAGULATION CONSULT NOTE  Pharmacy Consult for heparin Indication: chest pain/ACS  Allergies  Allergen Reactions  . Crestor [Rosuvastatin Calcium] Other (See Comments)    Joint pain, ankle pain     Patient Measurements: Height: 5\' 9"  (175.3 cm) Weight: 106.6 kg (235 lb 0.2 oz) IBW/kg (Calculated) : 70.7 Heparin Dosing Weight: 93.8 kg  Vital Signs: Temp: 98.5 F (36.9 C) (01/11 1635) Temp Source: Oral (01/11 1635) BP: 135/78 (01/11 1715) Pulse Rate: 56 (01/11 1715)  Labs: Recent Labs    12/25/20 1247 12/26/20 0500 12/27/20 0207 12/27/20 0211 12/27/20 0421 12/27/20 1138 12/27/20 1555 12/27/20 1600  HGB  --  16.7   < > 16.9  --  16.0  --  17.1*  HCT  --  47.5   < > 49.2  --  47.0  --  49.0  PLT  --  191  --  207  --   --   --  291  HEPARINUNFRC  --   --   --   --   --   --  0.11*  --   CREATININE  --  1.11  --  1.02  --   --   --  1.73*  TROPONINIHS 16  --   --  1,735* 2,258*  --   --   --    < > = values in this interval not displayed.    Estimated Creatinine Clearance: 48.5 mL/min (A) (by C-G formula based on SCr of 1.73 mg/dL (H)).  Assessment: 70 yo man COVID positive to start heparin for ACS.  He was not on anticoagulation PTA.   Heparin level is subtherapeutic at 0.11 on 1150 units/hr. No bleeding noted, CBC is normal. AKI noted.  Goal of Therapy:  Heparin level 0.3-0.7 units/ml Monitor platelets by anticoagulation protocol: Yes   Plan:  Heparin 2800 units IV bolus then increase drip rate to 1450 units/hr 8 hr heparin level Daily heparin level, CBC Monitor for s/sx of bleeding  Thank you for involving pharmacy in this patient's care.  78, PharmD, BCPS Clinical Pharmacist Clinical phone for 12/27/2020 until 10p is [phone] 12/27/2020 5:56 PM  **Pharmacist phone directory can be found on [email or web address] listed under [facility]**

## 2020-12-27 NOTE — ED Notes (Signed)
Pt unable to slow down breathing at this time and states "I feel like I am going to die" and "this is it". Therapeutic communication provided to help pt slow breathing.

## 2020-12-27 NOTE — Consult Note (Signed)
NAME:  Mario Torres, MRN:  [id number], DOB:  1950/12/26, LOS: 2 ADMISSION DATE:  01/12/2021, CONSULTATION DATE: 12/27/2020 REFERRING MD: Dr. Loney Loh, CHIEF COMPLAINT: Worsening shortness of breath and hypoxia  Brief History:  70 year old with hypertension hyperlipidemia unvaccinated presented with COVID-pneumonia now with acute hypoxic respiratory failure  History of Present Illness:  70 year old male with hypertension hyperlipidemia and coronary artery disease who was diagnosed with COVID pneumonia on 1/3 after he presented with shortness of breath, he was hospitalized from 1/3-1/6.  He was treated with a steroid and remdesivir and was discharged home on 3 L oxygen, came back on 1/8 with worsening shortness of breath, cough and diarrhea.  He is being managed under hospitalist care, his oxygen requirement is slowly going up, currently on high flow nasal cannula with nonrebreather with O2 sat ranging between 88 to 92%.  X-ray chest was repeated tonight which shows worsening bilateral infiltrate so PCCM consult was requested for evaluation. Patient denies complaint of chest pain, dysuria, fever, chills, nausea and vomiting.  Past Medical History:  Hyperlipidemia Hypertension Coronary artery disease   Significant Hospital Events:    Consults:  PCCM  Procedures:    Significant Diagnostic Tests:  1/9 CT angiogram of chest: Diffuse bilateral ground-glass airspace opacities. Differential includes atypical pneumonias such as viral or fungal, interstitial pneumonias, edema related to volume overload/CHF, chronic interstitial diseases, hypersensitivity pneumonitis, and respiratory bronchiolitis. Favor diffuse bilateral COVID-related pneumonia. 2. No pulmonary embolism, with mild study limitations detailed above. 3. Cardiomegaly. Three-vessel coronary artery calcifications. 4. Bilateral renal masses, incompletely imaged, presumed cysts. Consider nonemergent follow-up with renal CT or MRI  after current pulmonary issues are resolved  1/11: X-ray chest reviewed by me showing worsening bilateral infiltrates left more than right  Micro Data:    Antimicrobials:  Azithromycin 1/9 > Remdesivir 1/9 > Tocilizumab 1/9  Interim History / Subjective:  Patient with increasing oxygen requirement currently on high flow nasal cannula 100% FiO2 with 50 L and nonrebreather mask with O2 sat 88-92%.  Objective   Blood pressure (!) 181/108, pulse 83, temperature 98 F (36.7 C), temperature source Oral, resp. rate (!) 24, SpO2 (!) 88 %.    FiO2 (%):  [90 %-100 %] 100 %  No intake or output data in the 24 hours ending 12/27/20 0327 There were no vitals filed for this visit.  Examination:   Physical exam: General: Acutely ill-appearing obese male on high flow and nonrebreather HEENT: Boomer/AT, eyes anicteric.    Dry mucous membranes Neuro: Alert, awake, oriented x3, moving all 4 extremities. Chest: Coarse breath sounds, fine crackles heard at bases bilaterally no wheezes or rhonchi Heart: Regular rate and rhythm, no murmurs or gallops Abdomen: Soft, nontender, nondistended, bowel sounds present Skin: No rash   Resolved Hospital Problem list     Assessment & Plan:  Acute hypoxic respiratory failure due to ARDS from COVID-19 pneumonia Hypertension Hyperlipidemia  Patient continued to require increasing oxygen, currently his work of breathing is a stable Continue high flow nasal cannula oxygen with nonrebreather with O2 sat goal >85% Continue remdesivir and IV steroids Patient is status post Tocilizumab x1 Encourage prone position Give 1 dose of Lasix 80 mg x 1 Monitor serum creatinine Continue Coreg and atorvastatin Continue sliding scale insulin due to hyperglycemia from high-dose steroid  Patient is at high risk of endotracheal intubation, PCCM will follow along  Best practice (evaluated daily)  Diet: Per primary team Pain/Anxiety/Delirium protocol (if indicated):  N/A VAP protocol (if indicated): N/A DVT  prophylaxis: Enoxaparin GI prophylaxis: Famotidine Glucose control: SSI Mobility: As tolerated Disposition: Progressive care  Goals of Care:  Last date of multidisciplinary goals of care discussion: 1/11 Family and staff present: Patient's wife, daughter over the phone Summary of discussion: Continue full aggressive care even if it requires endotracheal intubation Follow up goals of care discussion due: 1/18 Code Status: Full code  Labs   CBC: Recent Labs  Lab 12/20/20 0527 12/21/20 0859 12/22/20 0435 2021/01/20 2156 12/26/20 0500 12/27/20 0207 12/27/20 0211  WBC 4.4 5.5 6.7 8.8 6.6  --  9.9  NEUTROABS 2.7  --   --   --  5.6  --  9.3*  HGB 15.3 16.6 15.8 16.3 16.7 16.0 16.9  HCT 43.2 47.9 44.8 48.4 47.5 47.0 49.2  MCV 91.7 90.5 90.0 90.5 90.6  --  89.6  PLT 85* 101* 114* 168 191  --  207    Basic Metabolic Panel: Recent Labs  Lab 12/20/20 0527 12/21/20 0859 12/22/20 0435 01/20/21 2156 12/26/20 0500 12/27/20 0207  NA 128* 133* 137 136 137 139  K 4.1 4.3 4.3 4.4 4.0 4.1  CL 99 102 104 101 102  --   CO2 18* 17* 22 24 24   --   GLUCOSE 150* 206* 192* 162* 125*  --   BUN 26* 38* 30* 18 26*  --   CREATININE 2.67* 1.61* 1.32* 1.14 1.11  --   CALCIUM 8.2* 9.1 8.7* 9.2 8.8*  --   MG  --   --   --   --  2.2  --   PHOS  --   --   --   --  3.1  --    GFR: Estimated Creatinine Clearance: 75.6 mL/min (by C-G formula based on SCr of 1.11 mg/dL). Recent Labs  Lab 12/20/20 0350 12/20/20 0527 12/21/20 0859 12/22/20 0435 20-Jan-2021 2156 12/25/20 1247 12/26/20 0500 12/27/20 0211  PROCALCITON 0.37  --   --   --   --  0.34  --   --   WBC  --  4.4   < > 6.7 8.8  --  6.6 9.9  LATICACIDVEN 1.4 1.2  --   --   --   --   --   --    < > = values in this interval not displayed.    Liver Function Tests: Recent Labs  Lab 12/20/20 0527 12/21/20 0859 12/22/20 0435 12/25/20 1247 12/26/20 0500  AST 46* 55* 77* 47* 58*  ALT 27 29 36 32  35  ALKPHOS 38 41 41 47 69  BILITOT 0.9 0.9 1.0 1.5* 1.1  PROT 5.8* 6.4* 6.1* 6.4* 5.9*  ALBUMIN 3.3* 3.3* 3.2* 2.8* 2.7*   No results for input(s): LIPASE, AMYLASE in the last 168 hours. No results for input(s): AMMONIA in the last 168 hours.  ABG    Component Value Date/Time   PHART 7.469 (H) 12/27/2020 0207   PCO2ART 35.4 12/27/2020 0207   PO2ART 53 (L) 12/27/2020 0207   HCO3 25.8 12/27/2020 0207   TCO2 27 12/27/2020 0207   O2SAT 90.0 12/27/2020 0207     Coagulation Profile: No results for input(s): INR, PROTIME in the last 168 hours.  Cardiac Enzymes: No results for input(s): CKTOTAL, CKMB, CKMBINDEX, TROPONINI in the last 168 hours.  HbA1C: Hgb A1c MFr Bld  Date/Time Value Ref Range Status  08/07/2013 02:52 PM 5.4 <5.7 % Final    Comment:    (NOTE)  According to the ADA Clinical Practice Recommendations for 2011, when HbA1c is used as a screening test:  >=6.5%   Diagnostic of Diabetes Mellitus           (if abnormal result is confirmed) 5.7-6.4%   Increased risk of developing Diabetes Mellitus References:Diagnosis and Classification of Diabetes Mellitus,Diabetes Care,2011,34(Suppl 1):S62-S69 and Standards of Medical Care in         Diabetes - 2011,Diabetes Care,2011,34 (Suppl 1):S11-S61.    CBG: Recent Labs  Lab 12/20/20 0657 12/26/20 2155  GLUCAP 136* 195*    Review of Systems:   12 point review of systems significant for complaint mentioned in HPI, rest is negative  Past Medical History:  He,  has a past medical history of Bilateral renal masses (12/26/2020), Hyperlipidemia, Hypertension, and MI (myocardial infarction) (HCC).   Surgical History:   Past Surgical History:  Procedure Laterality Date  . CARDIAC CATHETERIZATION  02/06/2011   normal LV; CAD 40% mid left  LADafter thsecond diagonal;90-95% culprit stenosis prox.circ ;80-90% stenosis prox RCA.  Marland Kitchen. CARDIAC CATHETERIZATION   11/24 /2003   normal cors  . CORONARY ANGIOPLASTY  02/06/2011   large dominant left circ sytem treated w/cutting balloon arthrotomy,stenting with 3.5 x 16 mm eerolimus Promus stent,postdilated to 4.0 mm  . CORONARY STENT PLACEMENT    . DOPPLER ECHOCARDIOGRAPHY  02/07/2011   EF 50 to 55%  mild -mod posterior wall hypokinesis,pulm vein flow pattern normal    . EVALUATION UNDER ANESTHESIA WITH HEMORRHOIDECTOMY N/A 07/31/2019   Procedure: EXAM UNDER ANESTHESIA WITH RUBBER BAND LIGATION OF INTERNAL HEMORRHOIDS;  Surgeon: Darnell LevelGerkin, Todd, MD;  Location: [facility];  Service: General;  Laterality: N/A;  . FISSURECTOMY N/A 07/31/2019   Procedure: FISSURECTOMY;  Surgeon: Darnell LevelGerkin, Todd, MD;  Location: [facility];  Service: General;  Laterality: N/A;  . KNEE SURGERY    . LEFT HEART CATHETERIZATION WITH CORONARY ANGIOGRAM N/A 08/10/2013   Procedure: LEFT HEART CATHETERIZATION WITH CORONARY ANGIOGRAM;  Surgeon: Lennette Biharihomas A Kelly, MD;  [facility];  Service: Cardiovascular;  Laterality: N/A;  . NM MYOCAR PERF WALL MOTION  02/19/2012   EF 61% ,NO SIGNIFICANT WALL MOTION ABNORMALITIES NOTED  . SPHINCTEROTOMY N/A 07/31/2019   Procedure: LATERAL INTERNAL SPHINCTEROTOMY;  Surgeon: Darnell LevelGerkin, Todd, MD;  Location: [facility];  Service: General;  Laterality: N/A;  . VASECTOMY       Social History:   reports that he quit smoking about 47 years ago. He has never used smokeless tobacco. He reports that he does not drink alcohol and does not use drugs.   Family History:  His family history includes Alcohol abuse in his father; Cirrhosis in his father; Hypertension in his father; Stomach cancer in his father. There is no history of Prostate cancer or Colon cancer.   Allergies Allergies  Allergen Reactions  . Crestor [Rosuvastatin Calcium] Other (See Comments)    Joint pain, ankle pain      Home Medications  Prior to Admission medications   Medication Sig Start Date  End Date Taking? Authorizing Provider  acetaminophen (TYLENOL) 500 MG tablet Take 500 mg by mouth every 6 (six) hours as needed for mild pain.   Yes [provider]  Ascorbic Acid (VITAMIN C) 1000 MG tablet Take 1,000 mg by mouth daily.   Yes [provider]  aspirin EC 81 MG tablet Take 81 mg by mouth daily.   Yes [provider]  atorvastatin (LIPITOR) 20 MG tablet TAKE 1 TABLET(20 MG)  BY MOUTH DAILY Patient taking differently: Take 20 mg by mouth daily. 11/01/20  Yes Lennette Bihari, MD  benzonatate (TESSALON) 100 MG capsule Take 1 capsule (100 mg total) by mouth 3 (three) times daily as needed for cough. 12/22/20  Yes Osvaldo Shipper, MD  carvedilol (COREG) 25 MG tablet TAKE 1 TABLET(25 MG) BY MOUTH TWICE DAILY WITH A MEAL Patient taking differently: Take 25 mg by mouth 2 (two) times daily with a meal. 11/01/20  Yes Lennette Bihari, MD  cholecalciferol (VITAMIN D) 25 MCG (1000 UNIT) tablet Take 1,000 Units by mouth daily.   Yes [provider]  famotidine (PEPCID) 20 MG tablet Take 1 tablet (20 mg total) by mouth daily for 10 days. 12/23/20 01/02/21 Yes Osvaldo Shipper, MD  Multiple Vitamin (MULTIVITAMIN) tablet Take 1 tablet by mouth daily.   Yes [provider]  nitroGLYCERIN (NITROSTAT) 0.4 MG SL tablet Place 1 tablet (0.4 mg total) under the tongue every 5 (five) minutes x 3 doses as needed for chest pain. 03/31/20  Yes Meng, Wynema Birch, PA  Omega-3 Fatty Acids (FISH OIL) 1000 MG CAPS Take 1 capsule (1,000 mg total) by mouth daily. 11/18/18  Yes Azalee Course, PA  ondansetron (ZOFRAN) 4 MG tablet Take 1 tablet (4 mg total) by mouth every 8 (eight) hours as needed for nausea or vomiting. 12/19/20  Yes Terressa Koyanagi, DO  predniSONE (DELTASONE) 20 MG tablet Take 3 tablets once daily for 3 days followed by 2 tablets once daily for 3 days followed by 1 tablet once daily for 3 days and then stop 12/22/20  Yes Osvaldo Shipper, MD  shark liver oil-cocoa butter (PREPARATION H)  0.25-3-85.5 % suppository Place 1 suppository rectally as needed for hemorrhoids.   Yes [provider]  azithromycin (ZITHROMAX) 250 MG tablet Day 1 take 2 pills, days 2-5 take 1 pill. Patient taking differently: Take 250 mg by mouth See admin instructions. Day 1 take 2 pills, days 2-5 take 1 pill. 12/23/20   Myrlene Broker, MD     Total critical care time: 42 minutes  Performed by: Cheri Fowler   Critical care time was exclusive of separately billable procedures and treating other patients.   Critical care was necessary to treat or prevent imminent or life-threatening deterioration.   Critical care was time spent personally by me on the following activities: development of treatment plan with patient and/or surrogate as well as nursing, discussions with consultants, evaluation of patient's response to treatment, examination of patient, obtaining history from patient or surrogate, ordering and performing treatments and interventions, ordering and review of laboratory studies, ordering and review of radiographic studies, pulse oximetry and re-evaluation of patient's condition.   Cheri Fowler MD [facility] Pager: [phone] Mobile: [phone]

## 2020-12-27 NOTE — Progress Notes (Signed)
Patient transported to 3M09 from ED. RN at bedside.

## 2020-12-27 NOTE — Progress Notes (Signed)
ANTICOAGULATION CONSULT NOTE - Initial Consult  Pharmacy Consult for heparin Indication: chest pain/ACS  Allergies  Allergen Reactions  . Crestor [Rosuvastatin Calcium] Other (See Comments)    Joint pain, ankle pain     Patient Measurements: Height: 5\' 9"  (175.3 cm) Weight: 106.6 kg (235 lb 0.2 oz) IBW/kg (Calculated) : 70.7 Heparin Dosing Weight: 93.8 kg  Vital Signs: BP: 173/98 (01/11 0400) Pulse Rate: 82 (01/11 0400)  Labs: Recent Labs    12/23/2020 2156 12/25/20 1247 12/26/20 0500 12/27/20 0207 12/27/20 0211  HGB 16.3  --  16.7 16.0 16.9  HCT 48.4  --  47.5 47.0 49.2  PLT 168  --  191  --  207  CREATININE 1.14  --  1.11  --  1.02  TROPONINIHS  --  16  --   --  1,735*    Estimated Creatinine Clearance: 82.3 mL/min (by C-G formula based on SCr of 1.02 mg/dL).   Medical History: Past Medical History:  Diagnosis Date  . Bilateral renal masses 12/26/2020  . Hyperlipidemia   . Hypertension   . MI (myocardial infarction) (HCC)    2012    Medications:  See medication history  Assessment: 71 yo man COVID positive to start heparin for ACS.  He  Was not on anticoagulation PTA.  Hg 16.9, PTLC 207 Goal of Therapy:  Heparin level 0.3-0.7 units/ml Monitor platelets by anticoagulation protocol: Yes   Plan:  Heparin 4000 unit bolus and drip at 1150 units/hr Check heparin level 6 hours after start  78 [place] 12/27/2020,4:43 AM

## 2020-12-27 NOTE — Progress Notes (Signed)
Checked on patient  Requiring sedation  Levophed to maintain blood pressure  Relatively normal echocardiogram 1 week ago with diastolic dysfunction  Did require nitroglycerin for chest pain chest discomfort This has resolved -Patient did arouse and was able to answer that he was not having any chest pains  Continue steroids Continue remdesivir  As needed paralytic for vent synchrony

## 2020-12-27 NOTE — Progress Notes (Addendum)
Overnight event  Patient admitted for acute hypoxic respiratory failure secondary to multifocal COVID-pneumonia.  CT angiogram done 1/9 had shown diffuse bilateral groundglass opacities but no PE. Paged by RN due to concern for worsening hypoxia.  Patient desatted to the low 80s on 45 LHHFNC 90% FiO2 +15 L NRB.  Patient was seen and examined at bedside.  States "I feel like I am dying."  Reports progressively worsening shortness of breath throughout the day and also complaining of substernal chest heaviness.  Tachypneic with respiratory rate in the upper twenties with slightly increased work of breathing.  Satting 88-90% on 55 L HHFNC +15 L NRB but intermittently sats dropping to as low as 85%.  Not tachycardic.  Blood pressure elevated.  Bibasilar rales appreciated upon auscultation of the lungs.  No wheezing.  No lower extremity edema.  Patient wishes to be full code and wants intubation/mechanical ventilation if his respiratory status continued to worsen.  -Morphine 1 mg x 1 to help with work of breathing -Arterial blood gas showing worsening pH 7.46, PCO2 35, PO2 53.  -Stat chest x-ray done and personally reviewed - showing significantly worse bilateral airspace opacities compared to chest x-ray done 1/8.  Radiologist read pending -Discussed with on-call PCCM MD, their team will see the patient.  Appreciate help. -Stat EKG ordered and currently pending -Stat troponin ordered and currently pending -Stat BNP ordered and currently pending  Addendum: -Patient already received Actemra on 1/9.  Currently on remdesivir and high-dose steroids. -Update: EKG showing sinus rhythm and PVCs.  RBBB and LAFB similar to prior tracing.  No acute ischemic changes.  Update: High-sensitivity troponin significantly elevated at 1735.  Patient continues to have chest pain despite morphine.  Blood pressure elevated with systolic in the 170s to 180s.  He has a known history of CAD status post DES on aspirin, statin,  and beta-blocker.  He had a low risk stress test in April 2021.  Discussed with on-call cardiologist Dr. Deforest Hoyles who feels that the troponin elevation is due to hypoxia and elevated blood pressure rather than ACS.  However, he recommends starting IV heparin for anticoagulation for now.  Recommends trending troponin and repeating echocardiogram to assess for new wall motion abnormalities.  Recommends starting nitro drip for persistent chest pain and to lower the patient's blood pressure with goal systolic less than 140.  Orders have been placed.

## 2020-12-27 NOTE — Progress Notes (Signed)
eLink Physician-Brief Progress Note Patient Name: Mario Torres DOB: [birth year] MRN: [id number]   Date of Service  12/27/2020  HPI/Events of Note  Hyperglycemia - Patient intubated , ventilated and on AC/HS moderate Novolog SSI.   eICU Interventions  Plan: 1. Will change to Q 4 hour moderate Novolog SSI.     Intervention Category Major Interventions: Hyperglycemia - active titration of insulin therapy  Lenell Antu 12/27/2020, 8:27 PM

## 2020-12-27 NOTE — ED Notes (Signed)
SDU Breakfast Ordered 

## 2020-12-27 NOTE — Procedures (Signed)
Central Venous Catheter Insertion Procedure Note  Mario Torres  [id number]  June 22, 1951  Date:12/27/20  Time:9:57 AM   Provider Performing:Amel Gianino A Jayleena Stille   Procedure: Insertion of Non-tunneled Central Venous Catheter(36556) with US guidance (50093)   Indication(s) Medication administration  Consent Unable to obtain consent due to emergent nature of procedure.  Anesthesia Topical only with 1% lidocaine   Timeout Verified patient identification, verified procedure, site/side was marked, verified correct patient position, special equipment/implants available, medications/allergies/relevant history reviewed, required imaging and test results available.  Sterile Technique Maximal sterile technique including full sterile barrier drape, hand hygiene, sterile gown, sterile gloves, mask, hair covering, sterile ultrasound probe cover (if used).  Procedure Description Area of catheter insertion was cleaned with chlorhexidine and draped in sterile fashion.  With real-time ultrasound guidance a central venous catheter was placed into the right internal jugular vein. Nonpulsatile blood flow and easy flushing noted in all ports.  The catheter was sutured in place and sterile dressing applied.  Complications/Tolerance None; patient tolerated the procedure well. Chest X-ray is ordered to verify placement for internal jugular or subclavian cannulation.   Chest x-ray is not ordered for femoral cannulation.  EBL Minimal  Specimen(s) None

## 2020-12-27 NOTE — Progress Notes (Signed)
Overnight and morning events noted. Now intubated. Appreciate PCCM's care. Will sign off for now.  Brendia Sacks, MD [facility]

## 2020-12-28 ENCOUNTER — Inpatient Hospital Stay (HOSPITAL_COMMUNITY): Payer: Medicare Other

## 2020-12-28 DIAGNOSIS — J80 Acute respiratory distress syndrome: Secondary | ICD-10-CM

## 2020-12-28 DIAGNOSIS — U071 COVID-19: Secondary | ICD-10-CM

## 2020-12-28 DIAGNOSIS — J9601 Acute respiratory failure with hypoxia: Secondary | ICD-10-CM | POA: Diagnosis not present

## 2020-12-28 DIAGNOSIS — R7989 Other specified abnormal findings of blood chemistry: Secondary | ICD-10-CM | POA: Diagnosis not present

## 2020-12-28 LAB — C-REACTIVE PROTEIN: CRP: 4 mg/dL — ABNORMAL HIGH (ref <1.0)

## 2020-12-28 LAB — POCT I-STAT 7, (LYTES, BLD GAS, ICA,H+H)
Acid-Base Excess: 0 mmol/L (ref 0.0–2.0)
Acid-base deficit: 1 mmol/L (ref 0.0–2.0)
Bicarbonate: 28.9 mmol/L — ABNORMAL HIGH (ref 20.0–28.0)
Bicarbonate: 27.7 mmol/L (ref 20.0–28.0)
Calcium, Ion: 1.23 mmol/L (ref 1.15–1.40)
Calcium, Ion: 1.24 mmol/L (ref 1.15–1.40)
HCT: 47 % (ref 39.0–52.0)
HCT: 47 % (ref 39.0–52.0)
Hemoglobin: 16 g/dL (ref 13.0–17.0)
Hemoglobin: 16 g/dL (ref 13.0–17.0)
O2 Saturation: 96 %
O2 Saturation: 98 %
Patient temperature: 98.1
Patient temperature: 36.1
Potassium: 4.4 mmol/L (ref 3.5–5.1)
Potassium: 4.5 mmol/L (ref 3.5–5.1)
Sodium: 138 mmol/L (ref 135–145)
Sodium: 136 mmol/L (ref 135–145)
TCO2: 31 mmol/L (ref 22–32)
TCO2: 30 mmol/L (ref 22–32)
pCO2 arterial: 66 mmHg (ref 32.0–48.0)
pCO2 arterial: 58.5 mmHg — ABNORMAL HIGH (ref 32.0–48.0)
pH, Arterial: 7.248 — ABNORMAL LOW (ref 7.350–7.450)
pH, Arterial: 7.279 — ABNORMAL LOW (ref 7.350–7.450)
pO2, Arterial: 99 mmHg (ref 83.0–108.0)
pO2, Arterial: 108 mmHg (ref 83.0–108.0)

## 2020-12-28 LAB — COMPREHENSIVE METABOLIC PANEL
ALT: 33 U/L (ref 0–44)
AST: 38 U/L (ref 15–41)
Albumin: 2.7 g/dL — ABNORMAL LOW (ref 3.5–5.0)
Alkaline Phosphatase: 109 U/L (ref 38–126)
Anion gap: 12 (ref 5–15)
BUN: 49 mg/dL — ABNORMAL HIGH (ref 8–23)
CO2: 26 mmol/L (ref 22–32)
Calcium: 8.6 mg/dL — ABNORMAL LOW (ref 8.9–10.3)
Chloride: 100 mmol/L (ref 98–111)
Creatinine, Ser: 2.23 mg/dL — ABNORMAL HIGH (ref 0.61–1.24)
GFR, Estimated: 31 mL/min — ABNORMAL LOW (ref >60)
Glucose, Bld: 255 mg/dL — ABNORMAL HIGH (ref 70–99)
Potassium: 4.5 mmol/L (ref 3.5–5.1)
Sodium: 138 mmol/L (ref 135–145)
Total Bilirubin: 0.8 mg/dL (ref 0.3–1.2)
Total Protein: 5.4 g/dL — ABNORMAL LOW (ref 6.5–8.1)

## 2020-12-28 LAB — CBC WITH DIFFERENTIAL/PLATELET
Abs Immature Granulocytes: 0.18 10*3/uL — ABNORMAL HIGH (ref 0.00–0.07)
Basophils Absolute: 0 10*3/uL (ref 0.0–0.1)
Basophils Relative: 0 %
Eosinophils Absolute: 0 10*3/uL (ref 0.0–0.5)
Eosinophils Relative: 0 %
HCT: 46.7 % (ref 39.0–52.0)
Hemoglobin: 16.1 g/dL (ref 13.0–17.0)
Immature Granulocytes: 1 %
Lymphocytes Relative: 4 %
Lymphs Abs: 0.7 10*3/uL (ref 0.7–4.0)
MCH: 32 pg (ref 26.0–34.0)
MCHC: 34.5 g/dL (ref 30.0–36.0)
MCV: 92.8 fL (ref 80.0–100.0)
Monocytes Absolute: 0.6 10*3/uL (ref 0.1–1.0)
Monocytes Relative: 3 %
Neutro Abs: 18.5 10*3/uL — ABNORMAL HIGH (ref 1.7–7.7)
Neutrophils Relative %: 92 %
Platelets: 295 10*3/uL (ref 150–400)
RBC: 5.03 MIL/uL (ref 4.22–5.81)
RDW: 12.9 % (ref 11.5–15.5)
WBC: 20 10*3/uL — ABNORMAL HIGH (ref 4.0–10.5)
nRBC: 0 % (ref 0.0–0.2)

## 2020-12-28 LAB — TRIGLYCERIDES: Triglycerides: 296 mg/dL — ABNORMAL HIGH (ref <150)

## 2020-12-28 LAB — GLUCOSE, CAPILLARY
Glucose-Capillary: 242 mg/dL — ABNORMAL HIGH (ref 70–99)
Glucose-Capillary: 258 mg/dL — ABNORMAL HIGH (ref 70–99)
Glucose-Capillary: 302 mg/dL — ABNORMAL HIGH (ref 70–99)
Glucose-Capillary: 304 mg/dL — ABNORMAL HIGH (ref 70–99)
Glucose-Capillary: 306 mg/dL — ABNORMAL HIGH (ref 70–99)
Glucose-Capillary: 306 mg/dL — ABNORMAL HIGH (ref 70–99)
Glucose-Capillary: 311 mg/dL — ABNORMAL HIGH (ref 70–99)

## 2020-12-28 LAB — HEPARIN LEVEL (UNFRACTIONATED)
Heparin Unfractionated: 0.49 IU/mL (ref 0.30–0.70)
Heparin Unfractionated: 0.58 IU/mL (ref 0.30–0.70)

## 2020-12-28 LAB — MAGNESIUM: Magnesium: 2.8 mg/dL — ABNORMAL HIGH (ref 1.7–2.4)

## 2020-12-28 LAB — PHOSPHORUS: Phosphorus: 6.2 mg/dL — ABNORMAL HIGH (ref 2.5–4.6)

## 2020-12-28 LAB — D-DIMER, QUANTITATIVE: D-Dimer, Quant: >20 ug/mL-FEU — ABNORMAL HIGH (ref 0.00–0.50)

## 2020-12-28 LAB — FERRITIN: Ferritin: 724 ng/mL — ABNORMAL HIGH (ref 24–336)

## 2020-12-28 MED ORDER — MIDAZOLAM 50MG/50ML (1MG/ML) PREMIX INFUSION
0.0000 mg/h | INTRAVENOUS | Status: DC
  Administered 2020-12-28 (×2): 4 mg/h via INTRAVENOUS
  Administered 2020-12-28: 2 mg/h via INTRAVENOUS
  Administered 2020-12-29 (×2): 4 mg/h via INTRAVENOUS
  Administered 2020-12-30: 5 mg/h via INTRAVENOUS
  Administered 2020-12-30: 4 mg/h via INTRAVENOUS
  Administered 2020-12-31: 5 mg/h via INTRAVENOUS
  Administered 2020-12-31: 8 mg/h via INTRAVENOUS
  Administered 2020-12-31: 5 mg/h via INTRAVENOUS
  Administered 2021-01-01: 3 mg/h via INTRAVENOUS
  Administered 2021-01-01: 8 mg/h via INTRAVENOUS
  Filled 2020-12-28 (×11): qty 50

## 2020-12-28 MED ORDER — LINAGLIPTIN 5 MG PO TABS
5.0000 mg | ORAL_TABLET | Freq: Every day | ORAL | Status: DC
  Administered 2020-12-29 – 2021-01-05 (×7): 5 mg
  Filled 2020-12-28 (×7): qty 1

## 2020-12-28 MED ORDER — INSULIN DETEMIR 100 UNIT/ML ~~LOC~~ SOLN
10.0000 [IU] | Freq: Every day | SUBCUTANEOUS | Status: DC
  Administered 2020-12-28: 10 [IU] via SUBCUTANEOUS
  Filled 2020-12-28: qty 0.1

## 2020-12-28 MED ORDER — LINAGLIPTIN 5 MG PO TABS
5.0000 mg | ORAL_TABLET | Freq: Every day | ORAL | Status: DC
  Administered 2020-12-28: 5 mg via ORAL
  Filled 2020-12-28: qty 1

## 2020-12-28 MED ORDER — INSULIN ASPART 100 UNIT/ML ~~LOC~~ SOLN
0.0000 [IU] | Freq: Three times a day (TID) | SUBCUTANEOUS | Status: DC
  Administered 2020-12-28 – 2020-12-29 (×5): 15 [IU] via SUBCUTANEOUS
  Administered 2020-12-30 (×3): 11 [IU] via SUBCUTANEOUS
  Administered 2020-12-31: 7 [IU] via SUBCUTANEOUS

## 2020-12-28 MED ORDER — INSULIN ASPART 100 UNIT/ML ~~LOC~~ SOLN
0.0000 [IU] | Freq: Every day | SUBCUTANEOUS | Status: DC
  Administered 2020-12-28 – 2020-12-29 (×2): 4 [IU] via SUBCUTANEOUS
  Administered 2020-12-30: 2 [IU] via SUBCUTANEOUS

## 2020-12-28 MED ORDER — PROSOURCE TF PO LIQD
45.0000 mL | Freq: Four times a day (QID) | ORAL | Status: DC
  Administered 2020-12-28 – 2021-01-04 (×28): 45 mL
  Filled 2020-12-28 (×28): qty 45

## 2020-12-28 MED ORDER — INSULIN DETEMIR 100 UNIT/ML ~~LOC~~ SOLN
10.0000 [IU] | Freq: Two times a day (BID) | SUBCUTANEOUS | Status: DC
  Administered 2020-12-28 – 2020-12-29 (×2): 10 [IU] via SUBCUTANEOUS
  Filled 2020-12-28 (×3): qty 0.1

## 2020-12-28 NOTE — Progress Notes (Signed)
Initial Nutrition Assessment  DOCUMENTATION CODES:   Not applicable  INTERVENTION:   Tube Feeding:  Vital High Protein at 40 ml/hr Pro-Source TF 45 mL QID Provides 129 g of protein, 1120 kcals and 806 mL of free water  TF regimen and propofol at current rate providing 1938 total kcal/day    NUTRITION DIAGNOSIS:   Inadequate oral intake related to acute illness as evidenced by NPO status.  GOAL:   Patient will meet greater than or equal to 90% of their needs  MONITOR:   Vent status,Labs,Weight trends,TF tolerance  REASON FOR ASSESSMENT:   Ventilator,Consult Enteral/tube feeding initiation and management  ASSESSMENT:   70 yo unvaccinated male admitted with acute respiratory failure with COVID-19 pneumonia. PMH includes HLD, HTN, CAD  1/11 Admitted, intubated   Patient is currently intubated on ventilator support MV:  13.9 L/min Temp (24hrs), Avg:98 F (36.7 C), Min:97 F (36.1 C), Max:98.8 F (37.1 C)  Propofol: 31 ml/hr  OG tube in stomach per xray  Unable to assess diet and weight history at this time Labs: CBG 242-306, phosphorus 6.2 (H) Meds: colace, Vit C, ss novolog, leveimr, solumedrol, zinc sulfate  Diet Order:   Diet Order            Diet Heart Room service appropriate? Yes; Fluid consistency: Thin  Diet effective now                 EDUCATION NEEDS:   Not appropriate for education at this time  Skin:  Skin Assessment: Reviewed RN Assessment  Last BM:  1/11  Height:   Ht Readings from Last 1 Encounters:  12/27/20 5\' 9"  (1.753 m)    Weight:   Wt Readings from Last 1 Encounters:  12/28/20 103.7 kg    BMI:  Body mass index is 33.76 kg/m.  Estimated Nutritional Needs:   Kcal:  1750-1950 kcals  Protein:  120-150 g  Fluid:  >/= 1.8 L    02/25/21 MS, RDN, LDN, CNSC Registered Dietitian III Clinical Nutrition RD Pager and On-Call Pager Number Located in [place]

## 2020-12-28 NOTE — Progress Notes (Signed)
eLink Physician-Brief Progress Note Patient Name: Mario Torres DOB: Mar 12, 1951 MRN: [id number]   Date of Service  12/28/2020  HPI/Events of Note  ABG on 80%/PRVC 30/TV 420/P 14 = 7.248/66/99/28.9  eICU Interventions  Plan: 1. Increase PRVC rate to 35. 2. Repeat ABG at 7:30 AM.     Intervention Category Major Interventions: Respiratory failure - evaluation and management;Acid-Base disturbance - evaluation and management  Yamil Oelke [place] 12/28/2020, 5:24 AM

## 2020-12-28 NOTE — Progress Notes (Signed)
Venous duplex lower ext.  has been completed. Refer to [facility] under chart review to view preliminary results.   12/28/2020  12:38 PM Callista Hoh, Gerarda Gunther

## 2020-12-28 NOTE — Progress Notes (Addendum)
NAME:  Mario Torres, MRN:  [id number], DOB:  Feb 24, 1951, LOS: 3 ADMISSION DATE:  2021/01/22, CONSULTATION DATE:  12/28/2019 REFERRING MD:  Dr Loney Loh, CHIEF COMPLAINT:  Worsening shortness of breath and hypoxia  Brief History:  70 year old with hypertension hyperlipidemia unvaccinated presented with COVID-pneumonia now with acute hypoxic respiratory failure  History of Present Illness:  70 year old male with hypertension hyperlipidemia and coronary artery disease who was diagnosed with COVID pneumonia on 1/3 after he presented with shortness of breath, he was hospitalized from 1/3-1/6.  He was treated with a steroid and remdesivir and was discharged home on 3 L oxygen, came back on 1/8 with worsening shortness of breath, cough and diarrhea.  He is being managed under hospitalist care, his oxygen requirement is slowly going up, currently on high flow nasal cannula with nonrebreather with O2 sat ranging between 88 to 92%.  X-ray chest was repeated tonight which shows worsening bilateral infiltrate so PCCM consult was requested for evaluation. Patient denies complaint of chest pain, dysuria, fever, chills, nausea and vomiting.  Past Medical History:  Hyperlipidemia Hypertension Coronary artery disease  Significant Hospital Events:  Endotracheal intubation 12/27/2020 Right IJ placement 12/27/2020  Consults:    Procedures:  Right IJ Endotracheal intubation  Significant Diagnostic Tests:  1/9 CT angiogram of chest: Diffuse bilateral ground-glass airspace opacities. Differential includes atypical pneumonias such as viral or fungal, interstitial pneumonias, edema related to volume overload/CHF, chronic interstitial diseases, hypersensitivity pneumonitis, and respiratory bronchiolitis. Favor diffuse bilateral COVID-related pneumonia. No pulmonary embolism, with mild study limitations detailed above. Cardiomegaly. Three-vessel coronary artery calcifications. Bilateral renal masses, incompletely  imaged, presumed cysts. Consider nonemergent follow-up with renal CT or MRI after current pulmonary issues are resolved  1/11: X-ray chest reviewed by me showing worsening bilateral infiltrates left more than right  Micro Data:  SARS coronavirus positive  Antimicrobials:  Azithromycin 1/9 > Remdesivir 1/9 > Tocilizumab 1/9  Interim History / Subjective:  Prop > versed Blood pressure meds (DC NTG and coreg) Basal insulin and resistant Trop  D-dimer   Objective   Blood pressure 103/61, pulse (!) 56, temperature (!) 97 F (36.1 C), temperature source Axillary, resp. rate (!) 35, height 5\' 9"  (1.753 m), weight 103.7 kg, SpO2 99 %.    Vent Mode: PRVC FiO2 (%):  [80 %-100 %] 80 % Set Rate:  [30 bmp-35 bmp] 35 bmp Vt Set:  [420 mL] 420 mL PEEP:  [14 cmH20] 14 cmH20 Plateau Pressure:  [26 cmH20-30 cmH20] 27 cmH20   Intake/Output Summary (Last 24 hours) at 12/28/2020 0854 Last data filed at 12/28/2020 0800 Gross per 24 hour  Intake 2428.13 ml  Output 390 ml  Net 2038.13 ml   Filed Weights   12/27/20 0400 12/28/20 0353  Weight: 106.6 kg 103.7 kg    Examination: General: Elderly, does not appear to be an extremis, on vent HENT: Size 8 endotracheal tube in place Lungs: Rales bibasilarly Cardiovascular: S1-S2 appreciated Abdomen: Soft, bowel sounds appreciated Extremities: No clubbing, no edema Neuro: Sedated GU:   Resolved Hospital Problem list     Assessment & Plan:  ARDS secondary to COVID-19 pneumonia: current vent 80% 14 PEEP sats 100% - Target TVol 6-8cc/kgIBW - Target Plateau Pressure < 30cm H20 - Target driving pressure less than 15 cm of water - Target PaO2 55-65: titrate PEEP/FiO2 per protocol - As long as PaO2 to FiO2 ratio is less than 1:150 position in prone position for 16 hours a day - Target CVP less than 4, diurese as  necessary - Ventilator associated pneumonia prevention protocol - Remdesivir DC today due to renal failure - IV steroids - Status  post to tocilizumab - Versed/Fentatnyl for RASS goal -4 to -5 (DC prop due to rising triglycerides)  Uncontrolled hypertension Now hypotensive, likely sedation related - Levophed low dose currently - DC nitroglycerine infusion and coreg.   ? ACS: Chest pain, Elevated troponin - Unfortunately not a candidate for intervention at this time - DC nitro due to hypotension - Heparin per pharmacy - Continue to trend troponin  AKI: worsening - trend BMP  Elevated D-dimer - doppler LE - Heparin ongoing  Hyperglycemia - SSI to resistant - Add basal insulin   Best practice (evaluated daily)  Diet: Tube feeding per protocol Pain/Anxiety/Delirium protocol (if indicated): Fentanyl, propofol VAP protocol (if indicated): In place DVT prophylaxis: heparin GI prophylaxis: Pepcid Glucose control: SSI Mobility: Bedrest Disposition: Full code  Significant other updated  Goals of Care:  Last date of multidisciplinary goals of care discussion:1/11 Family and staff present: Patient's wife, daughter over the phone Summary of discussion:Continue full aggressive care even if it requires  Follow up goals of care discussion due: 1/18 Code Status: Full code  Labs   CBC: Recent Labs  Lab 12/23/2020 2156 12/26/20 0500 12/27/20 0207 12/27/20 0211 12/27/20 1138 12/27/20 1600 12/28/20 0330 12/28/20 0444 12/28/20 0844  WBC 8.8 6.6  --  9.9  --  17.9* 20.0*  --   --   NEUTROABS  --  5.6  --  9.3*  --   --  18.5*  --   --   HGB 16.3 16.7   < > 16.9 16.0 17.1* 16.1 16.0 16.0  HCT 48.4 47.5   < > 49.2 47.0 49.0 46.7 47.0 47.0  MCV 90.5 90.6  --  89.6  --  90.1 92.8  --   --   PLT 168 191  --  207  --  291 295  --   --    < > = values in this interval not displayed.    Basic Metabolic Panel: Recent Labs  Lab 12/22/20 0435 12/23/2020 2156 12/26/20 0500 12/27/20 0207 12/27/20 0211 12/27/20 1138 12/27/20 1600 12/28/20 0330 12/28/20 0444 12/28/20 0844  NA 137 136 137   < > 137 139  --   138 138 136  K 4.3 4.4 4.0   < > 4.3 4.3  --  4.5 4.4 4.5  CL 104 101 102  --  105  --   --  100  --   --   CO2 22 24 24   --  23  --   --  26  --   --   GLUCOSE 192* 162* 125*  --  174*  --   --  255*  --   --   BUN 30* 18 26*  --  23  --   --  49*  --   --   CREATININE 1.32* 1.14 1.11  --  1.02  --  1.73* 2.23*  --   --   CALCIUM 8.7* 9.2 8.8*  --  9.0  --   --  8.6*  --   --   MG  --   --  2.2  --  2.1  --   --  2.8*  --   --   PHOS  --   --  3.1  --  3.0  --   --  6.2*  --   --    < > =  values in this interval not displayed.   GFR: Estimated Creatinine Clearance: 37.1 mL/min (A) (by C-G formula based on SCr of 2.23 mg/dL (H)). Recent Labs  Lab 12/25/20 1247 12/26/20 0500 12/27/20 0211 12/27/20 1600 12/28/20 0330  PROCALCITON 0.34  --   --   --   --   WBC  --  6.6 9.9 17.9* 20.0*    Liver Function Tests: Recent Labs  Lab 12/22/20 0435 12/25/20 1247 12/26/20 0500 12/27/20 0211 12/28/20 0330  AST 77* 47* 58* 68* 38  ALT 36 32 35 37 33  ALKPHOS 41 47 69 136* 109  BILITOT 1.0 1.5* 1.1 1.4* 0.8  PROT 6.1* 6.4* 5.9* 6.2* 5.4*  ALBUMIN 3.2* 2.8* 2.7* 2.8* 2.7*   No results for input(s): LIPASE, AMYLASE in the last 168 hours. No results for input(s): AMMONIA in the last 168 hours.  ABG    Component Value Date/Time   PHART 7.279 (L) 12/28/2020 0844   PCO2ART 58.5 (H) 12/28/2020 0844   PO2ART 108 12/28/2020 0844   HCO3 27.7 12/28/2020 0844   TCO2 30 12/28/2020 0844   ACIDBASEDEF 1.0 12/28/2020 0844   O2SAT 98.0 12/28/2020 0844     Coagulation Profile: No results for input(s): INR, PROTIME in the last 168 hours.  Cardiac Enzymes: No results for input(s): CKTOTAL, CKMB, CKMBINDEX, TROPONINI in the last 168 hours.  HbA1C: Hgb A1c MFr Bld  Date/Time Value Ref Range Status  12/27/2020 02:49 AM 6.5 (H) 4.8 - 5.6 % Final    Comment:    (NOTE) Pre diabetes:          5.7%-6.4%  Diabetes:              >6.4%  Glycemic control for   <7.0% adults with diabetes    08/07/2013 02:52 PM 5.4 <5.7 % Final    Comment:    (NOTE)                                                                       According to the ADA Clinical Practice Recommendations for 2011, when HbA1c is used as a screening test:  >=6.5%   Diagnostic of Diabetes Mellitus           (if abnormal result is confirmed) 5.7-6.4%   Increased risk of developing Diabetes Mellitus References:Diagnosis and Classification of Diabetes Mellitus,Diabetes Care,2011,34(Suppl 1):S62-S69 and Standards of Medical Care in         Diabetes - 2011,Diabetes Care,2011,34 (Suppl 1):S11-S61.    CBG: Recent Labs  Lab 12/27/20 1544 12/27/20 1959 12/27/20 2335 12/28/20 0326 12/28/20 0757  GLUCAP 209* 195* 271* 242* 258*    Review of Systems:   reviewed  Past Medical History:  He,  has a past medical history of Bilateral renal masses (12/26/2020), Hyperlipidemia, Hypertension, and MI (myocardial infarction) (HCC).   Surgical History:   Past Surgical History:  Procedure Laterality Date  . CARDIAC CATHETERIZATION  02/06/2011   normal LV; CAD 40% mid left  LADafter thsecond diagonal;90-95% culprit stenosis prox.circ ;80-90% stenosis prox RCA.  Marland Kitchen CARDIAC CATHETERIZATION  11/24 /2003   normal cors  . CORONARY ANGIOPLASTY  02/06/2011   large dominant left circ sytem treated w/cutting balloon arthrotomy,stenting with 3.5 x 16 mm eerolimus Promus stent,postdilated to  4.0 mm  . CORONARY STENT PLACEMENT    . DOPPLER ECHOCARDIOGRAPHY  02/07/2011   EF 50 to 55%  mild -mod posterior wall hypokinesis,pulm vein flow pattern normal    . EVALUATION UNDER ANESTHESIA WITH HEMORRHOIDECTOMY N/A 07/31/2019   Procedure: EXAM UNDER ANESTHESIA WITH RUBBER BAND LIGATION OF INTERNAL HEMORRHOIDS;  Surgeon: Darnell Level, MD;  Location: [facility];  Service: General;  Laterality: N/A;  . FISSURECTOMY N/A 07/31/2019   Procedure: FISSURECTOMY;  Surgeon: Darnell Level, MD;  Location: [facility];   Service: General;  Laterality: N/A;  . KNEE SURGERY    . LEFT HEART CATHETERIZATION WITH CORONARY ANGIOGRAM N/A 08/10/2013   Procedure: LEFT HEART CATHETERIZATION WITH CORONARY ANGIOGRAM;  Surgeon: Lennette Bihari, MD;  Location: [facility] CATH [facility];  Service: Cardiovascular;  Laterality: N/A;  . NM MYOCAR PERF WALL MOTION  02/19/2012   EF 61% ,NO SIGNIFICANT WALL MOTION ABNORMALITIES NOTED  . SPHINCTEROTOMY N/A 07/31/2019   Procedure: LATERAL INTERNAL SPHINCTEROTOMY;  Surgeon: Darnell Level, MD;  Location: [facility];  Service: General;  Laterality: N/A;  . VASECTOMY       Social History:   reports that he quit smoking about 47 years ago. He has never used smokeless tobacco. He reports that he does not drink alcohol and does not use drugs.   Family History:  His family history includes Alcohol abuse in his father; Cirrhosis in his father; Hypertension in his father; Stomach cancer in his father. There is no history of Prostate cancer or Colon cancer.   Allergies Allergies  Allergen Reactions  . Crestor [Rosuvastatin Calcium] Other (See Comments)    Joint pain, ankle pain      Home Medications  Prior to Admission medications   Medication Sig Start Date End Date Taking? Authorizing Provider  acetaminophen (TYLENOL) 500 MG tablet Take 500 mg by mouth every 6 (six) hours as needed for mild pain.   Yes [provider]  Ascorbic Acid (VITAMIN C) 1000 MG tablet Take 1,000 mg by mouth daily.   Yes [provider]  aspirin EC 81 MG tablet Take 81 mg by mouth daily.   Yes [provider]  atorvastatin (LIPITOR) 20 MG tablet TAKE 1 TABLET(20 MG) BY MOUTH DAILY Patient taking differently: Take 20 mg by mouth daily. 11/01/20  Yes Lennette Bihari, MD  benzonatate (TESSALON) 100 MG capsule Take 1 capsule (100 mg total) by mouth 3 (three) times daily as needed for cough. 12/22/20  Yes Osvaldo Shipper, MD  carvedilol (COREG) 25 MG tablet TAKE 1 TABLET(25 MG) BY MOUTH  TWICE DAILY WITH A MEAL Patient taking differently: Take 25 mg by mouth 2 (two) times daily with a meal. 11/01/20  Yes Lennette Bihari, MD  cholecalciferol (VITAMIN D) 25 MCG (1000 UNIT) tablet Take 1,000 Units by mouth daily.   Yes [provider]  famotidine (PEPCID) 20 MG tablet Take 1 tablet (20 mg total) by mouth daily for 10 days. 12/23/20 01/02/21 Yes Osvaldo Shipper, MD  Multiple Vitamin (MULTIVITAMIN) tablet Take 1 tablet by mouth daily.   Yes [provider]  nitroGLYCERIN (NITROSTAT) 0.4 MG SL tablet Place 1 tablet (0.4 mg total) under the tongue every 5 (five) minutes x 3 doses as needed for chest pain. 03/31/20  Yes Meng, Wynema Birch, PA  Omega-3 Fatty Acids (FISH OIL) 1000 MG CAPS Take 1 capsule (1,000 mg total) by mouth daily. 11/18/18  Yes Azalee Course, PA  ondansetron (ZOFRAN) 4 MG tablet Take 1  tablet (4 mg total) by mouth every 8 (eight) hours as needed for nausea or vomiting. 12/19/20  Yes Terressa KoyanagiKim, Hannah R, DO  predniSONE (DELTASONE) 20 MG tablet Take 3 tablets once daily for 3 days followed by 2 tablets once daily for 3 days followed by 1 tablet once daily for 3 days and then stop 12/22/20  Yes Osvaldo ShipperKrishnan, Gokul, MD  shark liver oil-cocoa butter (PREPARATION H) 0.25-3-85.5 % suppository Place 1 suppository rectally as needed for hemorrhoids.   Yes [provider]  azithromycin (ZITHROMAX) 250 MG tablet Day 1 take 2 pills, days 2-5 take 1 pill. Patient taking differently: Take 250 mg by mouth See admin instructions. Day 1 take 2 pills, days 2-5 take 1 pill. 12/23/20   Myrlene Brokerrawford, Elizabeth A, MD    Critical care time [address]50 mintues  Joneen RoachPaul Yoshimi Sarr, AGACNP-BC [facility]  See Amion for personal pager PCCM on call pager [phone]  12/28/2020 9:45 AM

## 2020-12-28 NOTE — Progress Notes (Signed)
Inpatient Diabetes Program Recommendations  AACE/ADA: New Consensus Statement on Inpatient Glycemic Control (2015)  Target Ranges:  Prepandial:   less than 140 mg/dL      Peak postprandial:   less than 180 mg/dL (1-2 hours)      Critically ill patients:  140 - 180 mg/dL   Lab Results  Component Value Date   GLUCAP 258 (H) 12/28/2020   HGBA1C 6.5 (H) 12/27/2020    Review of Glycemic Control Results for CARLIN, ATTRIDGE (MRN [id number]) as of 12/28/2020 11:11  Ref. Range 12/27/2020 23:35 12/28/2020 03:26 12/28/2020 07:57  Glucose-Capillary Latest Ref Range: 70 - 99 mg/dL 097 (H) 353 (H) 299 (H)   Diabetes history: New onset? Outpatient Diabetes medications: none Current orders for Inpatient glycemic control: Novolog 0-20 units TID, Novolog 0-5 units QHS, Levemir 10 units QD Solumedrol 106 mg BID  Inpatient Diabetes Program Recommendations:    Consider increasing Levemir to 10 units BID and adding Tradjenta 5 mg QD.   Thanks, Lujean Rave, MSN, RNC-OB Diabetes Coordinator [phone] (8a-5p)

## 2020-12-28 NOTE — Progress Notes (Signed)
ANTICOAGULATION CONSULT NOTE - Follow Up Consult  Pharmacy Consult for heparin Indication: chest pain/ACS  Labs: Recent Labs    12/25/20 1247 12/26/20 0500 12/27/20 0211 12/27/20 0421 12/27/20 1138 12/27/20 1555 12/27/20 1600 12/28/20 0330  HGB  --    < > 16.9  --  16.0  --  17.1* 16.1  HCT  --    < > 49.2  --  47.0  --  49.0 46.7  PLT  --    < > 207  --   --   --  291 295  HEPARINUNFRC  --   --   --   --   --  0.11*  --  0.49  CREATININE  --    < > 1.02  --   --   --  1.73* 2.23*  TROPONINIHS 16  --  1,735* 2,258*  --   --   --   --    < > = values in this interval not displayed.    Assessment/Plan:  70yo male therapeutic on heparin after rate change. Will continue gtt at current rate and confirm stable with additional level.   Vernard Gambles, PharmD, BCPS  12/28/2020,4:17 AM

## 2020-12-28 NOTE — Progress Notes (Signed)
ANTICOAGULATION CONSULT NOTE  Pharmacy Consult for heparin Indication: chest pain/ACS  Allergies  Allergen Reactions  . Crestor [Rosuvastatin Calcium] Other (See Comments)    Joint pain, ankle pain     Patient Measurements: Height: 5\' 9"  (175.3 cm) Weight: 103.7 kg (228 lb 9.9 oz) IBW/kg (Calculated) : 70.7 Heparin Dosing Weight: 93.8 kg  Vital Signs: Temp: 97 F (36.1 C) (01/12 0700) Temp Source: Axillary (01/12 0700) BP: 119/64 (01/12 1530) Pulse Rate: 70 (01/12 1530)  Labs: Recent Labs    12/27/20 0211 12/27/20 0421 12/27/20 1138 12/27/20 1555 12/27/20 1600 12/28/20 0330 12/28/20 0444 12/28/20 0844 12/28/20 1226  HGB 16.9  --    < >  --  17.1* 16.1 16.0 16.0  --   HCT 49.2  --    < >  --  49.0 46.7 47.0 47.0  --   PLT 207  --   --   --  291 295  --   --   --   HEPARINUNFRC  --   --   --  0.11*  --  0.49  --   --  0.58  CREATININE 1.02  --   --   --  1.73* 2.23*  --   --   --   TROPONINIHS 1,735* 2,258*  --   --   --   --   --   --   --    < > = values in this interval not displayed.    Estimated Creatinine Clearance: 37.1 mL/min (A) (by C-G formula based on SCr of 2.23 mg/dL (H)).  Assessment: 70 yo man COVID positive to start heparin for ACS.  He was not on anticoagulation PTA.   Confirmatory heparin level is therapeutic at 0.58 on 1450 units/hr. No bleeding noted, CBC is normal. AKI noted.  Goal of Therapy:  Heparin level 0.3-0.7 units/ml Monitor platelets by anticoagulation protocol: Yes   Plan:  Continue heparin at 1450 units/hr Daily heparin level, CBC Monitor for s/sx of bleeding  Thank you for involving pharmacy in this patient's care.  78, PharmD PGY1 Pharmacy Resident 12/28/2020 3:41 PM  **Pharmacist phone directory can be found on [email or web address] listed under [facility]**

## 2020-12-29 DIAGNOSIS — U071 COVID-19: Secondary | ICD-10-CM | POA: Diagnosis not present

## 2020-12-29 DIAGNOSIS — J9601 Acute respiratory failure with hypoxia: Secondary | ICD-10-CM | POA: Diagnosis not present

## 2020-12-29 LAB — MAGNESIUM: Magnesium: 3 mg/dL — ABNORMAL HIGH (ref 1.7–2.4)

## 2020-12-29 LAB — COMPREHENSIVE METABOLIC PANEL
ALT: 32 U/L (ref 0–44)
AST: 22 U/L (ref 15–41)
Albumin: 2.6 g/dL — ABNORMAL LOW (ref 3.5–5.0)
Alkaline Phosphatase: 86 U/L (ref 38–126)
Anion gap: 12 (ref 5–15)
BUN: 83 mg/dL — ABNORMAL HIGH (ref 8–23)
CO2: 26 mmol/L (ref 22–32)
Calcium: 8.5 mg/dL — ABNORMAL LOW (ref 8.9–10.3)
Chloride: 102 mmol/L (ref 98–111)
Creatinine, Ser: 2.65 mg/dL — ABNORMAL HIGH (ref 0.61–1.24)
GFR, Estimated: 25 mL/min — ABNORMAL LOW (ref >60)
Glucose, Bld: 299 mg/dL — ABNORMAL HIGH (ref 70–99)
Potassium: 4.5 mmol/L (ref 3.5–5.1)
Sodium: 140 mmol/L (ref 135–145)
Total Bilirubin: 0.5 mg/dL (ref 0.3–1.2)
Total Protein: 5.3 g/dL — ABNORMAL LOW (ref 6.5–8.1)

## 2020-12-29 LAB — D-DIMER, QUANTITATIVE: D-Dimer, Quant: 8.02 ug/mL-FEU — ABNORMAL HIGH (ref 0.00–0.50)

## 2020-12-29 LAB — CBC WITH DIFFERENTIAL/PLATELET
Abs Immature Granulocytes: 0.29 10*3/uL — ABNORMAL HIGH (ref 0.00–0.07)
Basophils Absolute: 0 10*3/uL (ref 0.0–0.1)
Basophils Relative: 0 %
Eosinophils Absolute: 0 10*3/uL (ref 0.0–0.5)
Eosinophils Relative: 0 %
HCT: 45.4 % (ref 39.0–52.0)
Hemoglobin: 15.5 g/dL (ref 13.0–17.0)
Immature Granulocytes: 2 %
Lymphocytes Relative: 3 %
Lymphs Abs: 0.6 10*3/uL — ABNORMAL LOW (ref 0.7–4.0)
MCH: 32 pg (ref 26.0–34.0)
MCHC: 34.1 g/dL (ref 30.0–36.0)
MCV: 93.6 fL (ref 80.0–100.0)
Monocytes Absolute: 0.5 10*3/uL (ref 0.1–1.0)
Monocytes Relative: 3 %
Neutro Abs: 16.6 10*3/uL — ABNORMAL HIGH (ref 1.7–7.7)
Neutrophils Relative %: 92 %
Platelets: 226 10*3/uL (ref 150–400)
RBC: 4.85 MIL/uL (ref 4.22–5.81)
RDW: 13 % (ref 11.5–15.5)
WBC: 18 10*3/uL — ABNORMAL HIGH (ref 4.0–10.5)
nRBC: 0 % (ref 0.0–0.2)

## 2020-12-29 LAB — GLUCOSE, CAPILLARY
Glucose-Capillary: 276 mg/dL — ABNORMAL HIGH (ref 70–99)
Glucose-Capillary: 323 mg/dL — ABNORMAL HIGH (ref 70–99)
Glucose-Capillary: 295 mg/dL — ABNORMAL HIGH (ref 70–99)
Glucose-Capillary: 320 mg/dL — ABNORMAL HIGH (ref 70–99)
Glucose-Capillary: 306 mg/dL — ABNORMAL HIGH (ref 70–99)
Glucose-Capillary: 305 mg/dL — ABNORMAL HIGH (ref 70–99)

## 2020-12-29 LAB — FERRITIN: Ferritin: 622 ng/mL — ABNORMAL HIGH (ref 24–336)

## 2020-12-29 LAB — C-REACTIVE PROTEIN: CRP: 1.3 mg/dL — ABNORMAL HIGH (ref <1.0)

## 2020-12-29 LAB — TROPONIN I (HIGH SENSITIVITY): Troponin I (High Sensitivity): 878 ng/L (ref <18)

## 2020-12-29 LAB — PHOSPHORUS: Phosphorus: 7.3 mg/dL — ABNORMAL HIGH (ref 2.5–4.6)

## 2020-12-29 LAB — HEPARIN LEVEL (UNFRACTIONATED): Heparin Unfractionated: 0.6 IU/mL (ref 0.30–0.70)

## 2020-12-29 MED ORDER — ALBUTEROL SULFATE (2.5 MG/3ML) 0.083% IN NEBU: 2.5000 mg | INHALATION_SOLUTION | RESPIRATORY_TRACT | Status: DC | PRN

## 2020-12-29 MED ORDER — INSULIN DETEMIR 100 UNIT/ML ~~LOC~~ SOLN
15.0000 [IU] | Freq: Two times a day (BID) | SUBCUTANEOUS | Status: DC
  Administered 2020-12-29 – 2020-12-30 (×2): 15 [IU] via SUBCUTANEOUS
  Filled 2020-12-29 (×3): qty 0.15

## 2020-12-29 MED ORDER — INSULIN DETEMIR 100 UNIT/ML ~~LOC~~ SOLN
5.0000 [IU] | SUBCUTANEOUS | Status: AC
  Administered 2020-12-29: 5 [IU] via SUBCUTANEOUS
  Filled 2020-12-29: qty 0.05

## 2020-12-29 NOTE — Progress Notes (Addendum)
Results for FARAAZ, WOLIN (MRN [id number]) as of 12/29/2020 13:55  Ref. Range 12/28/2020 23:31 12/29/2020 03:36 12/29/2020 08:49 12/29/2020 11:44 12/29/2020 13:06  Glucose-Capillary Latest Ref Range: 70 - 99 mg/dL 156 (H) 153 (H) 794 (H) 295 (H) 320 (H)  Noted that CBGs continue to be greater than 200 mg/dl.  Recommend increasing Levemir to 15 units BID, changing Novolog RESISTANT correction scale to every 4 hours since getting continuous tube feedings, and if blood sugars continue to be greater than 200 mg/dl. Titrate dosages as needed.  Will continue to monitor blood sugars while in the hospital.  Smith Mince RN BSN CDE Diabetes Coordinator Pager: [phone]  8am-5pm

## 2020-12-29 NOTE — Progress Notes (Signed)
NAME:  Mario Torres, MRN:  [id number], DOB:  05-04-1951, LOS: 4 ADMISSION DATE:  Dec 28, 2020, CONSULTATION DATE:  12/28/2019 REFERRING MD:  Dr Loney Loh, CHIEF COMPLAINT:  Worsening shortness of breath and hypoxia  Brief History:  70 year old with hypertension hyperlipidemia unvaccinated presented 1/8 with COVID-pneumonia now with acute hypoxic respiratory failure  History of Present Illness:  70 year old male with hypertension hyperlipidemia and coronary artery disease who was diagnosed with COVID pneumonia on 1/3 after he presented with shortness of breath, he was hospitalized from 1/3-1/6.  He was treated with a steroid and remdesivir and was discharged home on 3 L oxygen, came back on 1/8 with worsening shortness of breath, cough and diarrhea.  He is being managed under hospitalist care, his oxygen requirement is slowly going up, currently on high flow nasal cannula with nonrebreather with O2 sat ranging between 88 to 92%.  X-ray chest was repeated tonight which shows worsening bilateral infiltrate so PCCM consult was requested for evaluation. Patient denies complaint of chest pain, dysuria, fever, chills, nausea and vomiting.  Past Medical History:  Hyperlipidemia Hypertension Coronary artery disease  Significant Hospital Events:  Endotracheal intubation 12/27/2020 Right IJ placement 12/27/2020  Consults:    Procedures:  Right IJ 1/11>>> Endotracheal intubation 1/11>>>  Significant Diagnostic Tests:  1/9 CT angiogram of chest: Diffuse bilateral ground-glass airspace opacities. Differential includes atypical pneumonias such as viral or fungal, interstitial pneumonias, edema related to volume overload/CHF, chronic interstitial diseases, hypersensitivity pneumonitis, and respiratory bronchiolitis. Favor diffuse bilateral COVID-related pneumonia. No pulmonary embolism, with mild study limitations detailed above. Cardiomegaly. Three-vessel coronary artery calcifications. Bilateral renal  masses, incompletely imaged, presumed cysts. Consider nonemergent follow-up with renal CT or MRI after current pulmonary issues are resolved    Micro Data:  SARS coronavirus positive  Antimicrobials:  Azithromycin 1/9 > Remdesivir 1/9 > Tocilizumab 1/9  Interim History / Subjective:  Prop > versed Blood pressure meds (DC NTG and coreg) Basal insulin and resistant Trop  D-dimer   Objective   Blood pressure 108/66, pulse 89, temperature 98.1 F (36.7 C), temperature source Oral, resp. rate (!) 35, height 5\' 9"  (1.753 m), weight 105.2 kg, SpO2 93 %.    Vent Mode: PRVC FiO2 (%):  [60 %-80 %] 60 % Set Rate:  [35 bmp] 35 bmp Vt Set:  [420 mL] 420 mL PEEP:  [12 cmH20-14 cmH20] 12 cmH20 Plateau Pressure:  [24 cmH20-25 cmH20] 24 cmH20   Intake/Output Summary (Last 24 hours) at 12/29/2020 1053 Last data filed at 12/29/2020 12/31/2020 Gross per 24 hour  Intake 1715.37 ml  Output 410 ml  Net 1305.37 ml   Filed Weights   12/27/20 0400 12/28/20 0353 12/29/20 0500  Weight: 106.6 kg 103.7 kg 105.2 kg    Examination: General: elderly, chronically ill appearing male, NAD on vent  HENT: ETT, mm moist Lungs: resps even non labored on vent, few scattered rhonchi  Cardiovascular: S1-S2 appreciated Abdomen: Soft, bowel sounds appreciated Extremities: No clubbing, no edema Neuro: Sedated, RASS -2  GU:   Resolved Hospital Problem list     Assessment & Plan:  ARDS secondary to COVID-19 pneumonia: current vent 80% 14 PEEP sats 100% PLAN -  -low Vt ventilation 4-8cc/kg -goal plateau pressure <30, driving pressure 12/31/20 cm <16 -target PaO2 55-65, titrate PEEP/FiO2 per ARDS protocol  -if P/F ratio <150, consider prone therapy for 16 hours per day -goal CVP <4, diuresis as necessary -VAP prevention measures  -follow intermittent CXR  -solumdedrol 1-2mg /kg for 3-5 days then taper to  off pending O2 response -remdesivir d/c 1/13 r/t renal failure  - Status post to tocilizumab -  Versed/Fentatnyl for RASS goal -4 to -5 (DC prop due to rising triglycerides)   Uncontrolled hypertension Now hypotensive, likely sedation related PLAN -  Holding home antiHTN - Levophed low dose currently - DC nitroglycerin infusion and coreg.   ? ACS: Chest pain, Elevated troponin - Unfortunately not a candidate for intervention at this time PLAN -  - holding nitro due to hypotension - Heparin per pharmacy - Continue to trend troponin  AKI: worsening - trend BMP  Elevated D-dimer - doppler LE - Heparin ongoing  Hyperglycemia Continue levemir, SSI    Best practice (evaluated daily)  Diet: Tube feeding per protocol Pain/Anxiety/Delirium protocol (if indicated): Fentanyl, propofol VAP protocol (if indicated): In place DVT prophylaxis: heparin GI prophylaxis: Pepcid Glucose control: SSI Mobility: Bedrest Disposition: Full code  Significant other updated 1/13 at bedside   Goals of Care:  Last date of multidisciplinary goals of care discussion:1/11 Family and staff present: Patient's wife, daughter over the phone Summary of discussion:Continue full aggressive care Follow up goals of care discussion due: 1/18 Code Status: Full code  Labs   CBC: Recent Labs  Lab 12/26/20 0500 12/27/20 0207 12/27/20 0211 12/27/20 1138 12/27/20 1600 12/28/20 0330 12/28/20 0444 12/28/20 0844 12/29/20 0516  WBC 6.6  --  9.9  --  17.9* 20.0*  --   --  18.0*  NEUTROABS 5.6  --  9.3*  --   --  18.5*  --   --  16.6*  HGB 16.7   < > 16.9   < > 17.1* 16.1 16.0 16.0 15.5  HCT 47.5   < > 49.2   < > 49.0 46.7 47.0 47.0 45.4  MCV 90.6  --  89.6  --  90.1 92.8  --   --  93.6  PLT 191  --  207  --  291 295  --   --  226   < > = values in this interval not displayed.    Basic Metabolic Panel: Recent Labs  Lab December 31, 2020 2156 12/26/20 0500 12/27/20 0207 12/27/20 0211 12/27/20 1138 12/27/20 1600 12/28/20 0330 12/28/20 0444 12/28/20 0844 12/29/20 0516  NA 136 137   < > 137 139   --  138 138 136 140  K 4.4 4.0   < > 4.3 4.3  --  4.5 4.4 4.5 4.5  CL 101 102  --  105  --   --  100  --   --  102  CO2 24 24  --  23  --   --  26  --   --  26  GLUCOSE 162* 125*  --  174*  --   --  255*  --   --  299*  BUN 18 26*  --  23  --   --  49*  --   --  83*  CREATININE 1.14 1.11  --  1.02  --  1.73* 2.23*  --   --  2.65*  CALCIUM 9.2 8.8*  --  9.0  --   --  8.6*  --   --  8.5*  MG  --  2.2  --  2.1  --   --  2.8*  --   --  3.0*  PHOS  --  3.1  --  3.0  --   --  6.2*  --   --  7.3*   < > = values  in this interval not displayed.   GFR: Estimated Creatinine Clearance: 31.4 mL/min (A) (by C-G formula based on SCr of 2.65 mg/dL (H)). Recent Labs  Lab 12/25/20 1247 12/26/20 0500 12/27/20 0211 12/27/20 1600 12/28/20 0330 12/29/20 0516  PROCALCITON 0.34  --   --   --   --   --   WBC  --    < > 9.9 17.9* 20.0* 18.0*   < > = values in this interval not displayed.    Liver Function Tests: Recent Labs  Lab 12/25/20 1247 12/26/20 0500 12/27/20 0211 12/28/20 0330 12/29/20 0516  AST 47* 58* 68* 38 22  ALT 32 35 37 33 32  ALKPHOS 47 69 136* 109 86  BILITOT 1.5* 1.1 1.4* 0.8 0.5  PROT 6.4* 5.9* 6.2* 5.4* 5.3*  ALBUMIN 2.8* 2.7* 2.8* 2.7* 2.6*   No results for input(s): LIPASE, AMYLASE in the last 168 hours. No results for input(s): AMMONIA in the last 168 hours.  ABG    Component Value Date/Time   PHART 7.279 (L) 12/28/2020 0844   PCO2ART 58.5 (H) 12/28/2020 0844   PO2ART 108 12/28/2020 0844   HCO3 27.7 12/28/2020 0844   TCO2 30 12/28/2020 0844   ACIDBASEDEF 1.0 12/28/2020 0844   O2SAT 98.0 12/28/2020 0844     Coagulation Profile: No results for input(s): INR, PROTIME in the last 168 hours.  Cardiac Enzymes: No results for input(s): CKTOTAL, CKMB, CKMBINDEX, TROPONINI in the last 168 hours.  HbA1C: Hgb A1c MFr Bld  Date/Time Value Ref Range Status  12/27/2020 02:49 AM 6.5 (H) 4.8 - 5.6 % Final    Comment:    (NOTE) Pre diabetes:           5.7%-6.4%  Diabetes:              >6.4%  Glycemic control for   <7.0% adults with diabetes   08/07/2013 02:52 PM 5.4 <5.7 % Final    Comment:    (NOTE)                                                                       According to the ADA Clinical Practice Recommendations for 2011, when HbA1c is used as a screening test:  >=6.5%   Diagnostic of Diabetes Mellitus           (if abnormal result is confirmed) 5.7-6.4%   Increased risk of developing Diabetes Mellitus References:Diagnosis and Classification of Diabetes Mellitus,Diabetes Care,2011,34(Suppl 1):S62-S69 and Standards of Medical Care in         Diabetes - 2011,Diabetes Care,2011,34 (Suppl 1):S11-S61.    CBG: Recent Labs  Lab 12/28/20 1952 12/28/20 2133 12/28/20 2331 12/29/20 0336 12/29/20 0849  GLUCAP 306* 302* 311* 276* 323*    Critical care time 35 mintues   Dirk Dress, NP Pulmonary/Critical Care Medicine  12/29/2020  10:53 AM

## 2020-12-29 NOTE — Progress Notes (Signed)
ANTICOAGULATION CONSULT NOTE  Pharmacy Consult:  Heparin Indication: chest pain/ACS  Allergies  Allergen Reactions  . Crestor [Rosuvastatin Calcium] Other (See Comments)    Joint pain, ankle pain     Patient Measurements: Height: 5\' 9"  (175.3 cm) Weight: 105.2 kg (231 lb 14.8 oz) IBW/kg (Calculated) : 70.7 Heparin Dosing Weight: 93.8 kg  Vital Signs: Temp: 98.1 F (36.7 C) (01/13 0840) Temp Source: Oral (01/13 0840) BP: 108/66 (01/13 0746) Pulse Rate: 89 (01/13 0840)  Labs: Recent Labs    12/27/20 0211 12/27/20 0421 12/27/20 1138 12/27/20 1600 12/28/20 0330 12/28/20 0444 12/28/20 0844 12/28/20 1226 12/29/20 0500 12/29/20 0516  HGB 16.9  --    < > 17.1* 16.1 16.0 16.0  --   --  15.5  HCT 49.2  --    < > 49.0 46.7 47.0 47.0  --   --  45.4  PLT 207  --   --  291 295  --   --   --   --  226  HEPARINUNFRC  --   --    < >  --  0.49  --   --  0.58 0.60  --   CREATININE 1.02  --   --  1.73* 2.23*  --   --   --   --  2.65*  TROPONINIHS 1,735* 2,258*  --   --   --   --   --   --   --   --    < > = values in this interval not displayed.    Estimated Creatinine Clearance: 31.4 mL/min (A) (by C-G formula based on SCr of 2.65 mg/dL (H)).  Assessment: 70 yo man COVID positive to continue IV heparin for ACS.  Heparin level therapeutic and high normal.  No bleeding reported.  Goal of Therapy:  Heparin level 0.3-0.7 units/ml Monitor platelets by anticoagulation protocol: Yes   Plan:  Reduce heparin gtt slightly to 1400 units/hr  Daily heparin level, CBC Monitor for s/sx of bleeding  Stephanie Littman D. 01-23-1983, PharmD, BCPS, BCCCP 12/29/2020, 11:07 AM

## 2020-12-29 NOTE — Telephone Encounter (Signed)
FYI

## 2020-12-30 ENCOUNTER — Inpatient Hospital Stay (HOSPITAL_COMMUNITY): Payer: Medicare Other

## 2020-12-30 DIAGNOSIS — J9601 Acute respiratory failure with hypoxia: Secondary | ICD-10-CM | POA: Diagnosis not present

## 2020-12-30 DIAGNOSIS — U071 COVID-19: Secondary | ICD-10-CM | POA: Diagnosis not present

## 2020-12-30 LAB — COMPREHENSIVE METABOLIC PANEL
ALT: 23 U/L (ref 0–44)
AST: 20 U/L (ref 15–41)
Albumin: 2.5 g/dL — ABNORMAL LOW (ref 3.5–5.0)
Alkaline Phosphatase: 63 U/L (ref 38–126)
Anion gap: 12 (ref 5–15)
BUN: 111 mg/dL — ABNORMAL HIGH (ref 8–23)
CO2: 25 mmol/L (ref 22–32)
Calcium: 8.5 mg/dL — ABNORMAL LOW (ref 8.9–10.3)
Chloride: 103 mmol/L (ref 98–111)
Creatinine, Ser: 2.57 mg/dL — ABNORMAL HIGH (ref 0.61–1.24)
GFR, Estimated: 26 mL/min — ABNORMAL LOW (ref >60)
Glucose, Bld: 321 mg/dL — ABNORMAL HIGH (ref 70–99)
Potassium: 4.4 mmol/L (ref 3.5–5.1)
Sodium: 140 mmol/L (ref 135–145)
Total Bilirubin: 0.8 mg/dL (ref 0.3–1.2)
Total Protein: 4.9 g/dL — ABNORMAL LOW (ref 6.5–8.1)

## 2020-12-30 LAB — GLUCOSE, CAPILLARY
Glucose-Capillary: 232 mg/dL — ABNORMAL HIGH (ref 70–99)
Glucose-Capillary: 247 mg/dL — ABNORMAL HIGH (ref 70–99)
Glucose-Capillary: 254 mg/dL — ABNORMAL HIGH (ref 70–99)
Glucose-Capillary: 277 mg/dL — ABNORMAL HIGH (ref 70–99)
Glucose-Capillary: 281 mg/dL — ABNORMAL HIGH (ref 70–99)
Glucose-Capillary: 286 mg/dL — ABNORMAL HIGH (ref 70–99)
Glucose-Capillary: 292 mg/dL — ABNORMAL HIGH (ref 70–99)
Glucose-Capillary: 304 mg/dL — ABNORMAL HIGH (ref 70–99)

## 2020-12-30 LAB — D-DIMER, QUANTITATIVE: D-Dimer, Quant: 3.65 ug/mL-FEU — ABNORMAL HIGH (ref 0.00–0.50)

## 2020-12-30 LAB — CBC WITH DIFFERENTIAL/PLATELET
Abs Immature Granulocytes: 0.21 10*3/uL — ABNORMAL HIGH (ref 0.00–0.07)
Basophils Absolute: 0 10*3/uL (ref 0.0–0.1)
Basophils Relative: 0 %
Eosinophils Absolute: 0 10*3/uL (ref 0.0–0.5)
Eosinophils Relative: 0 %
HCT: 40.7 % (ref 39.0–52.0)
Hemoglobin: 13.9 g/dL (ref 13.0–17.0)
Immature Granulocytes: 2 %
Lymphocytes Relative: 4 %
Lymphs Abs: 0.5 10*3/uL — ABNORMAL LOW (ref 0.7–4.0)
MCH: 31.9 pg (ref 26.0–34.0)
MCHC: 34.2 g/dL (ref 30.0–36.0)
MCV: 93.3 fL (ref 80.0–100.0)
Monocytes Absolute: 0.3 10*3/uL (ref 0.1–1.0)
Monocytes Relative: 3 %
Neutro Abs: 9.7 10*3/uL — ABNORMAL HIGH (ref 1.7–7.7)
Neutrophils Relative %: 91 %
Platelets: 200 10*3/uL (ref 150–400)
RBC: 4.36 MIL/uL (ref 4.22–5.81)
RDW: 12.9 % (ref 11.5–15.5)
WBC: 10.7 10*3/uL — ABNORMAL HIGH (ref 4.0–10.5)
nRBC: 0 % (ref 0.0–0.2)

## 2020-12-30 LAB — PHOSPHORUS: Phosphorus: 5.5 mg/dL — ABNORMAL HIGH (ref 2.5–4.6)

## 2020-12-30 LAB — HEPARIN LEVEL (UNFRACTIONATED): Heparin Unfractionated: 0.94 IU/mL — ABNORMAL HIGH (ref 0.30–0.70)

## 2020-12-30 LAB — C-REACTIVE PROTEIN: CRP: 0.8 mg/dL (ref <1.0)

## 2020-12-30 LAB — BASIC METABOLIC PANEL
Anion gap: 10 (ref 5–15)
BUN: 110 mg/dL — ABNORMAL HIGH (ref 8–23)
CO2: 25 mmol/L (ref 22–32)
Calcium: 8.5 mg/dL — ABNORMAL LOW (ref 8.9–10.3)
Chloride: 107 mmol/L (ref 98–111)
Creatinine, Ser: 2.27 mg/dL — ABNORMAL HIGH (ref 0.61–1.24)
GFR, Estimated: 30 mL/min — ABNORMAL LOW (ref >60)
Glucose, Bld: 326 mg/dL — ABNORMAL HIGH (ref 70–99)
Potassium: 4.3 mmol/L (ref 3.5–5.1)
Sodium: 142 mmol/L (ref 135–145)

## 2020-12-30 LAB — FERRITIN: Ferritin: 716 ng/mL — ABNORMAL HIGH (ref 24–336)

## 2020-12-30 LAB — MAGNESIUM
Magnesium: 3.1 mg/dL — ABNORMAL HIGH (ref 1.7–2.4)
Magnesium: 3 mg/dL — ABNORMAL HIGH (ref 1.7–2.4)

## 2020-12-30 MED ORDER — HEPARIN SODIUM (PORCINE) 5000 UNIT/ML IJ SOLN
5000.0000 [IU] | Freq: Three times a day (TID) | INTRAMUSCULAR | Status: DC
  Administered 2020-12-30 – 2021-01-05 (×16): 5000 [IU] via SUBCUTANEOUS
  Filled 2020-12-30 (×16): qty 1

## 2020-12-30 MED ORDER — MIDAZOLAM BOLUS VIA INFUSION
1.0000 mg | INTRAVENOUS | Status: DC | PRN
  Administered 2020-12-30 (×2): 2 mg via INTRAVENOUS
  Administered 2020-12-31 (×3): 1 mg via INTRAVENOUS
  Administered 2020-12-31 – 2021-01-01 (×4): 2 mg via INTRAVENOUS
  Filled 2020-12-30: qty 2

## 2020-12-30 MED ORDER — INSULIN DETEMIR 100 UNIT/ML ~~LOC~~ SOLN
20.0000 [IU] | Freq: Two times a day (BID) | SUBCUTANEOUS | Status: DC
  Administered 2020-12-30 – 2021-01-01 (×5): 20 [IU] via SUBCUTANEOUS
  Filled 2020-12-30 (×7): qty 0.2

## 2020-12-30 MED ORDER — FENTANYL BOLUS VIA INFUSION
25.0000 ug | INTRAVENOUS | Status: DC | PRN
  Administered 2020-12-30 – 2020-12-31 (×7): 50 ug via INTRAVENOUS
  Filled 2020-12-30: qty 50

## 2020-12-30 MED ORDER — ATROPINE SULFATE 1 MG/10ML IJ SOSY
PREFILLED_SYRINGE | INTRAMUSCULAR | Status: AC
  Filled 2020-12-30: qty 10

## 2020-12-30 MED ORDER — METHYLPREDNISOLONE SODIUM SUCC 125 MG IJ SOLR
60.0000 mg | Freq: Every day | INTRAMUSCULAR | Status: DC
  Administered 2020-12-31 – 2021-01-01 (×2): 60 mg via INTRAVENOUS
  Filled 2020-12-30 (×2): qty 2

## 2020-12-30 NOTE — Progress Notes (Signed)
eLink Physician-Brief Progress Note Patient Name: Mario Torres DOB: Jan 27, 1951 MRN: [id number]   Date of Service  12/30/2020  HPI/Events of Note  Patient with multiple watery stools.  eICU Interventions  Flexiseal ordered to protect perineal skin integrity.        Migdalia Dk 12/30/2020, 8:33 PM

## 2020-12-30 NOTE — Progress Notes (Signed)
ANTICOAGULATION CONSULT NOTE  Pharmacy Consult:  Heparin Indication: chest pain/ACS  Allergies  Allergen Reactions  . Crestor [Rosuvastatin Calcium] Other (See Comments)    Joint pain, ankle pain     Patient Measurements: Height: 5\' 9"  (175.3 cm) Weight: 105.8 kg (233 lb 4 oz) IBW/kg (Calculated) : 70.7 Heparin Dosing Weight: 93.8 kg  Vital Signs: Temp: 97.8 F (36.6 C) (01/14 0402) Temp Source: Oral (01/14 0402) BP: 121/72 (01/14 0600) Pulse Rate: 56 (01/14 0600)  Labs: Recent Labs    12/28/20 0330 12/28/20 0444 12/28/20 0844 12/28/20 1226 12/29/20 0500 12/29/20 0516 12/29/20 1540 12/30/20 0504  HGB 16.1   < > 16.0  --   --  15.5  --  13.9  HCT 46.7   < > 47.0  --   --  45.4  --  40.7  PLT 295  --   --   --   --  226  --  200  HEPARINUNFRC 0.49  --   --  0.58 0.60  --   --  0.94*  CREATININE 2.23*  --   --   --   --  2.65*  --  2.57*  TROPONINIHS  --   --   --   --   --   --  878*  --    < > = values in this interval not displayed.    Estimated Creatinine Clearance: 32.5 mL/min (A) (by C-G formula based on SCr of 2.57 mg/dL (H)).  Assessment: 70 yo man COVID positive to continue IV heparin for ACS.  Heparin level supratherapeutic, H/H trending down.  No bleeding reported or issues with infusion.  Goal of Therapy:  Heparin level 0.3-0.7 units/ml Monitor platelets by anticoagulation protocol: Yes   Plan:  Reduce heparin gtt to 1200 units/hr  Daily heparin level, CBC Monitor for s/sx of bleeding  78, PharmD PGY1 Pharmacy Resident 12/30/2020 8:49 AM

## 2020-12-30 NOTE — Progress Notes (Signed)
Results for MADELINE, BEBOUT (MRN [id number]) as of 12/30/2020 11:34  Ref. Range 12/29/2020 16:07 12/29/2020 19:35 12/30/2020 00:15 12/30/2020 03:33 12/30/2020 07:22  Glucose-Capillary Latest Ref Range: 70 - 99 mg/dL 838 (H) 184 (H) 037 (H) 304 (H) 277 (H)  Noted that blood sugars have been greater than 180 mg/dl. Noted that Levemir has been increased to 20 units BID.  If blood sugars continue to be elevated after increasing Levemir, recommend adding Novolog 4 units every 4 hours for tube feed coverage.   Smith Mince RN BSN CDE Diabetes Coordinator Pager: [phone]  8am-5pm

## 2020-12-30 NOTE — Progress Notes (Signed)
NAME:  Mario Torres, MRN:  [id number], DOB:  05-Apr-1951, LOS: 5 ADMISSION DATE:  12/22/2020, CONSULTATION DATE:  12/28/2019 REFERRING MD:  Dr Loney Loh, CHIEF COMPLAINT:  Worsening shortness of breath and hypoxia  Brief History:  70 year old with hypertension hyperlipidemia unvaccinated presented 1/8 with COVID-pneumonia now with acute hypoxic respiratory failure  History of Present Illness:  70 year old male with hypertension hyperlipidemia and coronary artery disease who was diagnosed with COVID pneumonia on 1/3 after he presented with shortness of breath, he was hospitalized from 1/3-1/6.  He was treated with a steroid and remdesivir and was discharged home on 3 L oxygen, came back on 1/8 with worsening shortness of breath, cough and diarrhea.  He is being managed under hospitalist care, his oxygen requirement is slowly going up, currently on high flow nasal cannula with nonrebreather with O2 sat ranging between 88 to 92%.  X-ray chest was repeated tonight which shows worsening bilateral infiltrate so PCCM consult was requested for evaluation. Patient denies complaint of chest pain, dysuria, fever, chills, nausea and vomiting.  Past Medical History:  Hyperlipidemia Hypertension Coronary artery disease  Significant Hospital Events:  Endotracheal intubation 12/27/2020 Right IJ placement 12/27/2020  Consults:    Procedures:  Right IJ 1/11>>> Endotracheal intubation 1/11>>>  Significant Diagnostic Tests:  1/9 CT angiogram of chest: Diffuse bilateral ground-glass airspace opacities. Differential includes atypical pneumonias such as viral or fungal, interstitial pneumonias, edema related to volume overload/CHF, chronic interstitial diseases, hypersensitivity pneumonitis, and respiratory bronchiolitis. Favor diffuse bilateral COVID-related pneumonia. No pulmonary embolism, with mild study limitations detailed above. Cardiomegaly. Three-vessel coronary artery calcifications. Bilateral renal  masses, incompletely imaged, presumed cysts. Consider nonemergent follow-up with renal CT or MRI after current pulmonary issues are resolved    Micro Data:  SARS coronavirus positive  Antimicrobials:  Azithromycin 1/9 > Remdesivir 1/9 > Tocilizumab 1/9  Interim History / Subjective:  Patient remains sedated on mechanical support.  Critically ill intubated on mechanical life support.  Sedation has remained a little bit of an issue.  He does get severely agitated pretty quickly  Objective   Blood pressure 121/72, pulse (!) 56, temperature 97.8 F (36.6 C), temperature source Oral, resp. rate (!) 30, height 5\' 9"  (1.753 m), weight 105.8 kg, SpO2 99 %.    Vent Mode: PRVC FiO2 (%):  [50 %] 50 % Set Rate:  [35 bmp] 35 bmp Vt Set:  [420 mL] 420 mL PEEP:  [12 cmH20] 12 cmH20 Plateau Pressure:  [24 cmH20] 24 cmH20   Intake/Output Summary (Last 24 hours) at 12/30/2020 1002 Last data filed at 12/30/2020 0956 Gross per 24 hour  Intake 1830.7 ml  Output 2075 ml  Net -244.3 ml   Filed Weights   12/28/20 0353 12/29/20 0500 12/30/20 0500  Weight: 103.7 kg 105.2 kg 105.8 kg    Examination: General: Elderly male intubated on mechanical life support HENT: Endotracheal tube in place Lungs: Bilateral mechanically ventilated breath sounds Cardiovascular: Regular rate rhythm, S1-S2 Abdomen: Soft, nontender nondistended bowel sounds present Extremities: No significant edema Neuro: Sedated GU: Deferred  Resolved Hospital Problem list     Assessment & Plan:   ARDS secondary to COVID-19 pneumonia: current vent 80% 14 PEEP sats 100% PLAN -  Continue mechanical ventilation per ARDS protocol Target TVol 6-8cc/kgIBW Target Plateau Pressure < 30cm H20 Target driving pressure less than 15 cm of water Target PaO2 55-65: titrate PEEP/FiO2 per protocol As long as PaO2 to FiO2 ratio is less than 1:150 position in prone position for 16 hours  a day Ventilator associated pneumonia prevention  protocol   Uncontrolled hypertension at baseline Now hypotensive, likely sedation related PLAN -  Holding home BP meds. Off Levophed at this time  Elevated troponin, suspect supply demand mismatch - Unfortunately not a candidate for intervention at this time PLAN -  -Heparin  AKI: worsening Follow urine output and BMP  Elevated D-dimer Continue heparin  Hyperglycemia Levemir plus SSI Goal CBG 140-180   Best practice (evaluated daily)  Diet: Tube feeding per protocol Pain/Anxiety/Delirium protocol (if indicated): Fentanyl, propofol VAP protocol (if indicated): In place DVT prophylaxis: heparin GI prophylaxis: Pepcid Glucose control: SSI Mobility: Bedrest Disposition: Full code  Significant other updated 1/13 at bedside   Goals of Care:  Last date of multidisciplinary goals of care discussion:1/11 Family and staff present: Patient's wife, daughter over the phone Summary of discussion:Continue full aggressive care Follow up goals of care discussion due: 1/18 Code Status: Full code  Labs   CBC: Recent Labs  Lab 12/26/20 0500 12/27/20 0207 12/27/20 0211 12/27/20 1138 12/27/20 1600 12/28/20 0330 12/28/20 0444 12/28/20 0844 12/29/20 0516 12/30/20 0504  WBC 6.6  --  9.9  --  17.9* 20.0*  --   --  18.0* 10.7*  NEUTROABS 5.6  --  9.3*  --   --  18.5*  --   --  16.6* 9.7*  HGB 16.7   < > 16.9   < > 17.1* 16.1 16.0 16.0 15.5 13.9  HCT 47.5   < > 49.2   < > 49.0 46.7 47.0 47.0 45.4 40.7  MCV 90.6  --  89.6  --  90.1 92.8  --   --  93.6 93.3  PLT 191  --  207  --  291 295  --   --  226 200   < > = values in this interval not displayed.    Basic Metabolic Panel: Recent Labs  Lab 12/26/20 0500 12/27/20 0207 12/27/20 0211 12/27/20 1138 12/27/20 1600 12/28/20 0330 12/28/20 0444 12/28/20 0844 12/29/20 0516 12/30/20 0504  NA 137   < > 137   < >  --  138 138 136 140 140  K 4.0   < > 4.3   < >  --  4.5 4.4 4.5 4.5 4.4  CL 102  --  105  --   --  100  --   --   102 103  CO2 24  --  23  --   --  26  --   --  26 25  GLUCOSE 125*  --  174*  --   --  255*  --   --  299* 321*  BUN 26*  --  23  --   --  49*  --   --  83* 111*  CREATININE 1.11  --  1.02  --  1.73* 2.23*  --   --  2.65* 2.57*  CALCIUM 8.8*  --  9.0  --   --  8.6*  --   --  8.5* 8.5*  MG 2.2  --  2.1  --   --  2.8*  --   --  3.0* 3.1*  PHOS 3.1  --  3.0  --   --  6.2*  --   --  7.3* 5.5*   < > = values in this interval not displayed.   GFR: Estimated Creatinine Clearance: 32.5 mL/min (A) (by C-G formula based on SCr of 2.57 mg/dL (H)). Recent Labs  Lab 12/25/20 1247 12/26/20  0500 12/27/20 1600 12/28/20 0330 12/29/20 0516 12/30/20 0504  PROCALCITON 0.34  --   --   --   --   --   WBC  --    < > 17.9* 20.0* 18.0* 10.7*   < > = values in this interval not displayed.    Liver Function Tests: Recent Labs  Lab 12/26/20 0500 12/27/20 0211 12/28/20 0330 12/29/20 0516 12/30/20 0504  AST 58* 68* 38 22 20  ALT 35 37 33 32 23  ALKPHOS 69 136* 109 86 63  BILITOT 1.1 1.4* 0.8 0.5 0.8  PROT 5.9* 6.2* 5.4* 5.3* 4.9*  ALBUMIN 2.7* 2.8* 2.7* 2.6* 2.5*   No results for input(s): LIPASE, AMYLASE in the last 168 hours. No results for input(s): AMMONIA in the last 168 hours.  ABG    Component Value Date/Time   PHART 7.279 (L) 12/28/2020 0844   PCO2ART 58.5 (H) 12/28/2020 0844   PO2ART 108 12/28/2020 0844   HCO3 27.7 12/28/2020 0844   TCO2 30 12/28/2020 0844   ACIDBASEDEF 1.0 12/28/2020 0844   O2SAT 98.0 12/28/2020 0844     Coagulation Profile: No results for input(s): INR, PROTIME in the last 168 hours.  Cardiac Enzymes: No results for input(s): CKTOTAL, CKMB, CKMBINDEX, TROPONINI in the last 168 hours.  HbA1C: Hgb A1c MFr Bld  Date/Time Value Ref Range Status  12/27/2020 02:49 AM 6.5 (H) 4.8 - 5.6 % Final    Comment:    (NOTE) Pre diabetes:          5.7%-6.4%  Diabetes:              >6.4%  Glycemic control for   <7.0% adults with diabetes   08/07/2013 02:52 PM 5.4  <5.7 % Final    Comment:    (NOTE)                                                                       According to the ADA Clinical Practice Recommendations for 2011, when HbA1c is used as a screening test:  >=6.5%   Diagnostic of Diabetes Mellitus           (if abnormal result is confirmed) 5.7-6.4%   Increased risk of developing Diabetes Mellitus References:Diagnosis and Classification of Diabetes Mellitus,Diabetes Care,2011,34(Suppl 1):S62-S69 and Standards of Medical Care in         Diabetes - 2011,Diabetes Care,2011,34 (Suppl 1):S11-S61.    CBG: Recent Labs  Lab 12/29/20 1607 12/29/20 1935 12/30/20 0015 12/30/20 0333 12/30/20 0722  GLUCAP 306* 305* 292* 304* 277*     This patient is critically ill with multiple organ system failure; which, requires frequent high complexity decision making, assessment, support, evaluation, and titration of therapies. This was completed through the application of advanced monitoring technologies and extensive interpretation of multiple databases. During this encounter critical care time was devoted to patient care services described in this note for 32 minutes.   Josephine Igo, DO [facility] 12/30/2020 10:02 AM

## 2020-12-30 NOTE — Telephone Encounter (Signed)
Colman Cater. [name], MD 12/30/2020 8:33 AM

## 2020-12-31 DIAGNOSIS — U071 COVID-19: Secondary | ICD-10-CM | POA: Diagnosis not present

## 2020-12-31 DIAGNOSIS — J9601 Acute respiratory failure with hypoxia: Secondary | ICD-10-CM | POA: Diagnosis not present

## 2020-12-31 LAB — GLUCOSE, CAPILLARY
Glucose-Capillary: 230 mg/dL — ABNORMAL HIGH (ref 70–99)
Glucose-Capillary: 237 mg/dL — ABNORMAL HIGH (ref 70–99)
Glucose-Capillary: 241 mg/dL — ABNORMAL HIGH (ref 70–99)
Glucose-Capillary: 245 mg/dL — ABNORMAL HIGH (ref 70–99)
Glucose-Capillary: 278 mg/dL — ABNORMAL HIGH (ref 70–99)
Glucose-Capillary: 304 mg/dL — ABNORMAL HIGH (ref 70–99)

## 2020-12-31 LAB — COMPREHENSIVE METABOLIC PANEL
ALT: 25 U/L (ref 0–44)
AST: 21 U/L (ref 15–41)
Albumin: 2.5 g/dL — ABNORMAL LOW (ref 3.5–5.0)
Alkaline Phosphatase: 57 U/L (ref 38–126)
Anion gap: 11 (ref 5–15)
BUN: 114 mg/dL — ABNORMAL HIGH (ref 8–23)
CO2: 25 mmol/L (ref 22–32)
Calcium: 8.9 mg/dL (ref 8.9–10.3)
Chloride: 108 mmol/L (ref 98–111)
Creatinine, Ser: 2.04 mg/dL — ABNORMAL HIGH (ref 0.61–1.24)
GFR, Estimated: 35 mL/min — ABNORMAL LOW (ref >60)
Glucose, Bld: 256 mg/dL — ABNORMAL HIGH (ref 70–99)
Potassium: 4.7 mmol/L (ref 3.5–5.1)
Sodium: 144 mmol/L (ref 135–145)
Total Bilirubin: 0.8 mg/dL (ref 0.3–1.2)
Total Protein: 5 g/dL — ABNORMAL LOW (ref 6.5–8.1)

## 2020-12-31 LAB — TROPONIN I (HIGH SENSITIVITY): Troponin I (High Sensitivity): 459 ng/L (ref <18)

## 2020-12-31 LAB — MAGNESIUM: Magnesium: 3.1 mg/dL — ABNORMAL HIGH (ref 1.7–2.4)

## 2020-12-31 MED ORDER — FENTANYL BOLUS VIA INFUSION
50.0000 ug | INTRAVENOUS | Status: DC | PRN
  Administered 2020-12-31 (×2): 100 ug via INTRAVENOUS
  Administered 2021-01-01: 50 ug via INTRAVENOUS
  Administered 2021-01-01 – 2021-01-02 (×11): 100 ug via INTRAVENOUS
  Filled 2020-12-31: qty 100

## 2020-12-31 MED ORDER — INSULIN ASPART 100 UNIT/ML ~~LOC~~ SOLN
0.0000 [IU] | SUBCUTANEOUS | Status: DC
  Administered 2020-12-31: 15 [IU] via SUBCUTANEOUS
  Administered 2020-12-31 (×2): 7 [IU] via SUBCUTANEOUS
  Administered 2021-01-01: 4 [IU] via SUBCUTANEOUS
  Administered 2021-01-01: 3 [IU] via SUBCUTANEOUS
  Administered 2021-01-01 (×2): 11 [IU] via SUBCUTANEOUS
  Administered 2021-01-01: 7 [IU] via SUBCUTANEOUS
  Administered 2021-01-01: 3 [IU] via SUBCUTANEOUS
  Administered 2021-01-01: 7 [IU] via SUBCUTANEOUS
  Administered 2021-01-02: 11 [IU] via SUBCUTANEOUS
  Administered 2021-01-02: 7 [IU] via SUBCUTANEOUS
  Administered 2021-01-02: 15 [IU] via SUBCUTANEOUS
  Administered 2021-01-02: 7 [IU] via SUBCUTANEOUS
  Administered 2021-01-02 (×2): 15 [IU] via SUBCUTANEOUS
  Administered 2021-01-03: 7 [IU] via SUBCUTANEOUS

## 2020-12-31 MED ORDER — FENTANYL BOLUS VIA INFUSION
100.0000 ug | INTRAVENOUS | Status: DC | PRN
  Administered 2020-12-31: 150 ug via INTRAVENOUS
  Filled 2020-12-31: qty 200

## 2020-12-31 MED ORDER — FUROSEMIDE 10 MG/ML IJ SOLN
40.0000 mg | Freq: Four times a day (QID) | INTRAMUSCULAR | Status: AC
  Administered 2020-12-31 (×2): 40 mg via INTRAVENOUS
  Filled 2020-12-31 (×2): qty 4

## 2020-12-31 NOTE — Progress Notes (Signed)
NAME:  Mario Torres, MRN:  [id number], DOB:  [birth year], LOS: 6 ADMISSION DATE:  12/26/2020, CONSULTATION DATE:  12/28/2019 REFERRING MD:  Dr Loney Loh, CHIEF COMPLAINT:  Worsening shortness of breath and hypoxia  Brief History:  70 year old with hypertension hyperlipidemia unvaccinated presented 1/8 with COVID-pneumonia now with acute hypoxic respiratory failure  History of Present Illness:  70 year old male with hypertension hyperlipidemia and coronary artery disease who was diagnosed with COVID pneumonia on 1/3 after he presented with shortness of breath, he was hospitalized from 1/3-1/6.  He was treated with a steroid and remdesivir and was discharged home on 3 L oxygen, came back on 1/8 with worsening shortness of breath, cough and diarrhea.  He is being managed under hospitalist care, his oxygen requirement is slowly going up, currently on high flow nasal cannula with nonrebreather with O2 sat ranging between 88 to 92%.  X-ray chest was repeated tonight which shows worsening bilateral infiltrate so PCCM consult was requested for evaluation. Patient denies complaint of chest pain, dysuria, fever, chills, nausea and vomiting.  Past Medical History:  Hyperlipidemia Hypertension Coronary artery disease  Significant Hospital Events:  Endotracheal intubation 12/27/2020 Right IJ placement 12/27/2020  Consults:    Procedures:  Right IJ 1/11>>> Endotracheal intubation 1/11>>>  Significant Diagnostic Tests:   1/9 CT angiogram of chest: Diffuse bilateral ground-glass airspace opacities. Differential includes atypical pneumonias such as viral or fungal, interstitial pneumonias, edema related to volume overload/CHF, chronic interstitial diseases, hypersensitivity pneumonitis, and respiratory bronchiolitis. Favor diffuse bilateral COVID-related pneumonia. No pulmonary embolism, with mild study limitations detailed above. Cardiomegaly. Three-vessel coronary artery calcifications. Bilateral renal  masses, incompletely imaged, presumed cysts. Consider nonemergent follow-up with renal CT or MRI after current pulmonary issues are resolved   Micro Data:  SARS coronavirus positive  Antimicrobials:  Azithromycin 1/9 > Remdesivir 1/9 > Tocilizumab 1/9  Interim History / Subjective:   Patient remains critically ill intubated on mechanical life support.  They were able to decrease his FiO2 requirements this morning.  Objective   Blood pressure (!) 158/73, pulse (!) 105, temperature 98 F (36.7 C), temperature source Oral, resp. rate 18, height 5\' 9"  (1.753 m), weight 104.6 kg, SpO2 (!) 87 %.    Vent Mode: PRVC FiO2 (%):  [40 %-50 %] 40 % Set Rate:  [35 bmp] 35 bmp Vt Set:  [420 mL] 420 mL PEEP:  [10 cmH20] 10 cmH20 Plateau Pressure:  [20 cmH20-24 cmH20] 20 cmH20   Intake/Output Summary (Last 24 hours) at 12/31/2020 1001 Last data filed at 12/31/2020 0800 Gross per 24 hour  Intake 2163.26 ml  Output 1890 ml  Net 273.26 ml   Filed Weights   12/29/20 0500 12/30/20 0500 12/31/20 0450  Weight: 105.2 kg 105.8 kg 104.6 kg    Examination: General: Elderly male intubated on mechanical life support HENT: Endotracheal tube in place, responsive to stimulation opens eyes to voice Lungs: Bilateral mechanically ventilated breath sounds Cardiovascular: Regular rate and rhythm, S1-S2 Abdomen: Soft, nontender nondistended bowel sounds present Extremities: No significant edema Neuro: Sedated on mechanical support GU: Deferred  Resolved Hospital Problem list     Assessment & Plan:   ARDS secondary to COVID-19 pneumonia: current vent 80% 14 PEEP sats 100% PLAN -  Continue mechanical ventilation per ARDS protocol Target TVol 6-8cc/kgIBW Target Plateau Pressure < 30cm H20 Target driving pressure less than 15 cm of water Target PaO2 55-65: titrate PEEP/FiO2 per protocol As long as PaO2 to FiO2 ratio is less than 1:150 position in prone position  for 16 hours a day Ventilator  associated pneumonia prevention protocol  Uncontrolled hypertension at baseline Now hypotensive, likely sedation related PLAN -  Holding home BP meds at this time  Elevated troponin, suspect supply demand mismatch - Unfortunately not a candidate for intervention at this time PLAN -  Heparin changed to DVT prophylaxis  AKI:  AKI improving, follow urine output Additional dose of Lasix today with positive cumulative fluid balance  Elevated D-dimer DVT prophylaxis Heparin  Hyperglycemia Levemir plus SSI Goal CBG 140-180   Best practice (evaluated daily)  Diet: Tube feeding per protocol Pain/Anxiety/Delirium protocol (if indicated): Fentanyl, propofol VAP protocol (if indicated): In place DVT prophylaxis: heparin GI prophylaxis: Pepcid Glucose control: SSI Mobility: Bedrest Disposition: Full code  We will update patient's family.  I attempted to call this morning there was no answer 12/31/2020.  Goals of Care:  Last date of multidisciplinary goals of care discussion:1/11 Family and staff present: Patient's wife, daughter over the phone Summary of discussion:Continue full aggressive care Follow up goals of care discussion due: 1/18 Code Status: Full code  Labs   CBC: Recent Labs  Lab 12/26/20 0500 12/27/20 0207 12/27/20 0211 12/27/20 1138 12/27/20 1600 12/28/20 0330 12/28/20 0444 12/28/20 0844 12/29/20 0516 12/30/20 0504  WBC 6.6  --  9.9  --  17.9* 20.0*  --   --  18.0* 10.7*  NEUTROABS 5.6  --  9.3*  --   --  18.5*  --   --  16.6* 9.7*  HGB 16.7   < > 16.9   < > 17.1* 16.1 16.0 16.0 15.5 13.9  HCT 47.5   < > 49.2   < > 49.0 46.7 47.0 47.0 45.4 40.7  MCV 90.6  --  89.6  --  90.1 92.8  --   --  93.6 93.3  PLT 191  --  207  --  291 295  --   --  226 200   < > = values in this interval not displayed.    Basic Metabolic Panel: Recent Labs  Lab 12/26/20 0500 12/27/20 0207 12/27/20 0211 12/27/20 1138 12/28/20 0330 12/28/20 0444 12/28/20 0844  12/29/20 0516 12/30/20 0504 12/30/20 1545 12/31/20 0444  NA 137   < > 137   < > 138   < > 136 140 140 142 144  K 4.0   < > 4.3   < > 4.5   < > 4.5 4.5 4.4 4.3 4.7  CL 102  --  105  --  100  --   --  102 103 107 108  CO2 24  --  23  --  26  --   --  26 25 25 25   GLUCOSE 125*  --  174*  --  255*  --   --  299* 321* 326* 256*  BUN 26*  --  23  --  49*  --   --  83* 111* 110* 114*  CREATININE 1.11  --  1.02   < > 2.23*  --   --  2.65* 2.57* 2.27* 2.04*  CALCIUM 8.8*  --  9.0  --  8.6*  --   --  8.5* 8.5* 8.5* 8.9  MG 2.2  --  2.1  --  2.8*  --   --  3.0* 3.1* 3.0* 3.1*  PHOS 3.1  --  3.0  --  6.2*  --   --  7.3* 5.5*  --   --    < > = values in  this interval not displayed.   GFR: Estimated Creatinine Clearance: 40.7 mL/min (A) (by C-G formula based on SCr of 2.04 mg/dL (H)). Recent Labs  Lab 12/25/20 1247 12/26/20 0500 12/27/20 1600 12/28/20 0330 12/29/20 0516 12/30/20 0504  PROCALCITON 0.34  --   --   --   --   --   WBC  --    < > 17.9* 20.0* 18.0* 10.7*   < > = values in this interval not displayed.    Liver Function Tests: Recent Labs  Lab 12/27/20 0211 12/28/20 0330 12/29/20 0516 12/30/20 0504 12/31/20 0444  AST 68* 38 22 20 21   ALT 37 33 32 23 25  ALKPHOS 136* 109 86 63 57  BILITOT 1.4* 0.8 0.5 0.8 0.8  PROT 6.2* 5.4* 5.3* 4.9* 5.0*  ALBUMIN 2.8* 2.7* 2.6* 2.5* 2.5*   No results for input(s): LIPASE, AMYLASE in the last 168 hours. No results for input(s): AMMONIA in the last 168 hours.  ABG    Component Value Date/Time   PHART 7.279 (L) 12/28/2020 0844   PCO2ART 58.5 (H) 12/28/2020 0844   PO2ART 108 12/28/2020 0844   HCO3 27.7 12/28/2020 0844   TCO2 30 12/28/2020 0844   ACIDBASEDEF 1.0 12/28/2020 0844   O2SAT 98.0 12/28/2020 0844     Coagulation Profile: No results for input(s): INR, PROTIME in the last 168 hours.  Cardiac Enzymes: No results for input(s): CKTOTAL, CKMB, CKMBINDEX, TROPONINI in the last 168 hours.  HbA1C: Hgb A1c MFr Bld   Date/Time Value Ref Range Status  12/27/2020 02:49 AM 6.5 (H) 4.8 - 5.6 % Final    Comment:    (NOTE) Pre diabetes:          5.7%-6.4%  Diabetes:              >6.4%  Glycemic control for   <7.0% adults with diabetes   08/07/2013 02:52 PM 5.4 <5.7 % Final    Comment:    (NOTE)                                                                       According to the ADA Clinical Practice Recommendations for 2011, when HbA1c is used as a screening test:  >=6.5%   Diagnostic of Diabetes Mellitus           (if abnormal result is confirmed) 5.7-6.4%   Increased risk of developing Diabetes Mellitus References:Diagnosis and Classification of Diabetes Mellitus,Diabetes Care,2011,34(Suppl 1):S62-S69 and Standards of Medical Care in         Diabetes - 2011,Diabetes Care,2011,34 (Suppl 1):S11-S61.    CBG: Recent Labs  Lab 12/30/20 1946 12/30/20 2122 12/30/20 2341 12/31/20 0418 12/31/20 0835  GLUCAP 254* 247* 232* 241* 230*     This patient is critically ill with multiple organ system failure; which, requires frequent high complexity decision making, assessment, support, evaluation, and titration of therapies. This was completed through the application of advanced monitoring technologies and extensive interpretation of multiple databases. During this encounter critical care time was devoted to patient care services described in this note for 32 minutes.  01/02/21, DO [facility] 12/31/2020 10:01 AM

## 2021-01-01 ENCOUNTER — Inpatient Hospital Stay (HOSPITAL_COMMUNITY): Payer: Medicare Other

## 2021-01-01 DIAGNOSIS — U071 COVID-19: Secondary | ICD-10-CM | POA: Diagnosis not present

## 2021-01-01 DIAGNOSIS — J80 Acute respiratory distress syndrome: Secondary | ICD-10-CM

## 2021-01-01 LAB — COMPREHENSIVE METABOLIC PANEL
ALT: 25 U/L (ref 0–44)
AST: 21 U/L (ref 15–41)
Albumin: 2.5 g/dL — ABNORMAL LOW (ref 3.5–5.0)
Alkaline Phosphatase: 58 U/L (ref 38–126)
Anion gap: 10 (ref 5–15)
BUN: 113 mg/dL — ABNORMAL HIGH (ref 8–23)
CO2: 28 mmol/L (ref 22–32)
Calcium: 9.2 mg/dL (ref 8.9–10.3)
Chloride: 112 mmol/L — ABNORMAL HIGH (ref 98–111)
Creatinine, Ser: 1.92 mg/dL — ABNORMAL HIGH (ref 0.61–1.24)
GFR, Estimated: 37 mL/min — ABNORMAL LOW (ref >60)
Glucose, Bld: 258 mg/dL — ABNORMAL HIGH (ref 70–99)
Potassium: 4.8 mmol/L (ref 3.5–5.1)
Sodium: 150 mmol/L — ABNORMAL HIGH (ref 135–145)
Total Bilirubin: 0.8 mg/dL (ref 0.3–1.2)
Total Protein: 5.1 g/dL — ABNORMAL LOW (ref 6.5–8.1)

## 2021-01-01 LAB — CBC
HCT: 43.6 % (ref 39.0–52.0)
Hemoglobin: 14.6 g/dL (ref 13.0–17.0)
MCH: 31.8 pg (ref 26.0–34.0)
MCHC: 33.5 g/dL (ref 30.0–36.0)
MCV: 95 fL (ref 80.0–100.0)
Platelets: 160 10*3/uL (ref 150–400)
RBC: 4.59 MIL/uL (ref 4.22–5.81)
RDW: 13.1 % (ref 11.5–15.5)
WBC: 8.5 10*3/uL (ref 4.0–10.5)
nRBC: 0 % (ref 0.0–0.2)

## 2021-01-01 LAB — GLUCOSE, CAPILLARY
Glucose-Capillary: 133 mg/dL — ABNORMAL HIGH (ref 70–99)
Glucose-Capillary: 138 mg/dL — ABNORMAL HIGH (ref 70–99)
Glucose-Capillary: 191 mg/dL — ABNORMAL HIGH (ref 70–99)
Glucose-Capillary: 216 mg/dL — ABNORMAL HIGH (ref 70–99)
Glucose-Capillary: 233 mg/dL — ABNORMAL HIGH (ref 70–99)
Glucose-Capillary: 252 mg/dL — ABNORMAL HIGH (ref 70–99)

## 2021-01-01 MED ORDER — MIDAZOLAM HCL 2 MG/2ML IJ SOLN
1.0000 mg | INTRAMUSCULAR | Status: DC | PRN
Start: 2021-01-01 — End: 2021-01-01
  Administered 2021-01-01: 2 mg via INTRAVENOUS
  Filled 2021-01-01: qty 2

## 2021-01-01 MED ORDER — MIDAZOLAM HCL 2 MG/2ML IJ SOLN
1.0000 mg | INTRAMUSCULAR | Status: DC | PRN
Start: 2021-01-01 — End: 2021-01-03
  Administered 2021-01-01 – 2021-01-02 (×6): 2 mg via INTRAVENOUS
  Filled 2021-01-01 (×7): qty 2

## 2021-01-01 MED ORDER — SODIUM CHLORIDE 0.9% FLUSH: 10.0000 mL | INTRAVENOUS | Status: DC | PRN

## 2021-01-01 MED ORDER — OXYCODONE HCL 5 MG PO TABS
5.0000 mg | ORAL_TABLET | Freq: Three times a day (TID) | ORAL | Status: DC
  Administered 2021-01-01 – 2021-01-04 (×9): 5 mg
  Filled 2021-01-01 (×10): qty 1

## 2021-01-01 MED ORDER — SODIUM CHLORIDE 0.9% FLUSH
10.0000 mL | Freq: Two times a day (BID) | INTRAVENOUS | Status: DC
  Administered 2021-01-02 – 2021-01-04 (×6): 10 mL

## 2021-01-01 MED ORDER — SODIUM CHLORIDE 0.9 % IV SOLN: INTRAVENOUS | Status: DC

## 2021-01-01 MED ORDER — FUROSEMIDE 10 MG/ML IJ SOLN
40.0000 mg | Freq: Four times a day (QID) | INTRAMUSCULAR | Status: AC
  Administered 2021-01-01 (×2): 40 mg via INTRAVENOUS
  Filled 2021-01-01 (×2): qty 4

## 2021-01-01 MED ORDER — DEXMEDETOMIDINE HCL IN NACL 400 MCG/100ML IV SOLN
0.4000 ug/kg/h | INTRAVENOUS | Status: DC
  Administered 2021-01-01: 0.6 ug/kg/h via INTRAVENOUS
  Administered 2021-01-01: 0.4 ug/kg/h via INTRAVENOUS
  Administered 2021-01-01: 0.6 ug/kg/h via INTRAVENOUS
  Administered 2021-01-02 (×2): 1.2 ug/kg/h via INTRAVENOUS
  Administered 2021-01-02: 0.8 ug/kg/h via INTRAVENOUS
  Administered 2021-01-02: 0.5 ug/kg/h via INTRAVENOUS
  Administered 2021-01-02 – 2021-01-03 (×3): 1 ug/kg/h via INTRAVENOUS
  Administered 2021-01-03: 0.4 ug/kg/h via INTRAVENOUS
  Administered 2021-01-04: 0.6 ug/kg/h via INTRAVENOUS
  Administered 2021-01-04: 0.4 ug/kg/h via INTRAVENOUS
  Administered 2021-01-05: 1.2 ug/kg/h via INTRAVENOUS
  Filled 2021-01-01 (×6): qty 100
  Filled 2021-01-01: qty 200
  Filled 2021-01-01: qty 100
  Filled 2021-01-01: qty 200
  Filled 2021-01-01 (×6): qty 100

## 2021-01-01 NOTE — Plan of Care (Signed)
PCCM Goals of Care Discussion and Advanced Care Planning:   Date: 01/01/2021   Present Parties: Wife via phone  What was discussed:   Current condition on ventilator: ARDS with COVID19. We are still battling sedation needs and we discussed this. Hopeful for possible SBT/SAT. I explained this as well.    Outcome: Remains full code. Hopeful for continued recovery   16 mins of time was spent discussing the goals of care, advanced care planning options such as code status as well as do not resuscitate forms.

## 2021-01-01 NOTE — Progress Notes (Signed)
NAME:  Mario Torres, MRN:  [id number], DOB:  [birth year], LOS: 7 ADMISSION DATE:  December 29, 2020, CONSULTATION DATE:  12/28/2019 REFERRING MD:  Dr Loney Loh, CHIEF COMPLAINT:  Worsening shortness of breath and hypoxia  Brief History:  70 year old with hypertension hyperlipidemia unvaccinated presented 1/8 with COVID-pneumonia now with acute hypoxic respiratory failure  History of Present Illness:  70 year old male with hypertension hyperlipidemia and coronary artery disease who was diagnosed with COVID pneumonia on 1/3 after he presented with shortness of breath, he was hospitalized from 1/3-1/6.  He was treated with a steroid and remdesivir and was discharged home on 3 L oxygen, came back on 1/8 with worsening shortness of breath, cough and diarrhea.  He is being managed under hospitalist care, his oxygen requirement is slowly going up, currently on high flow nasal cannula with nonrebreather with O2 sat ranging between 88 to 92%.  X-ray chest was repeated tonight which shows worsening bilateral infiltrate so PCCM consult was requested for evaluation. Patient denies complaint of chest pain, dysuria, fever, chills, nausea and vomiting.  Past Medical History:  Hyperlipidemia Hypertension Coronary artery disease  Significant Hospital Events:  Endotracheal intubation 12/27/2020 Right IJ placement 12/27/2020  Consults:    Procedures:  Right IJ 1/11>>> Endotracheal intubation 1/11>>>  Significant Diagnostic Tests:   1/9 CT angiogram of chest: Diffuse bilateral ground-glass airspace opacities. Differential includes atypical pneumonias such as viral or fungal, interstitial pneumonias, edema related to volume overload/CHF, chronic interstitial diseases, hypersensitivity pneumonitis, and respiratory bronchiolitis. Favor diffuse bilateral COVID-related pneumonia. No pulmonary embolism, with mild study limitations detailed above. Cardiomegaly. Three-vessel coronary artery calcifications. Bilateral renal  masses, incompletely imaged, presumed cysts. Consider nonemergent follow-up with renal CT or MRI after current pulmonary issues are resolved   Micro Data:  SARS coronavirus positive  Antimicrobials:  Azithromycin 1/9 > Remdesivir 1/9 > Tocilizumab 1/9  Interim History / Subjective:   Patient remains intubated critically ill on mechanical life support called spoke with patient's wife via phone.  Objective   Blood pressure (!) 163/97, pulse 70, temperature 97.9 F (36.6 C), temperature source Oral, resp. rate (!) 35, height 5\' 9"  (1.753 m), weight 103.3 kg, SpO2 93 %.    Vent Mode: PRVC FiO2 (%):  [40 %] 40 % Set Rate:  [35 bmp] 35 bmp Vt Set:  [420 mL] 420 mL PEEP:  [8 cmH20-10 cmH20] 8 cmH20 Plateau Pressure:  [20 cmH20-22 cmH20] 22 cmH20   Intake/Output Summary (Last 24 hours) at 01/01/2021 1032 Last data filed at 01/01/2021 0800 Gross per 24 hour  Intake 2476.18 ml  Output 3845 ml  Net -1368.82 ml   Filed Weights   12/30/20 0500 12/31/20 0450 01/01/21 0400  Weight: 105.8 kg 104.6 kg 103.3 kg    Examination: General: Elderly male intubated on mechanical life support HENT: Endotracheal tube in place opens eyes to voice into stimulation Lungs: Bilateral mechanically ventilated breath sounds Cardiovascular: Regular rate rhythm, S1-S2 Abdomen soft, nontender nondistended Extremities: No significant edema Neuro: Sedated on mechanical support GU: Deferred  Resolved Hospital Problem list     Assessment & Plan:   ARDS secondary to COVID-19 Pneumonia  Continue mechanical ventilation per ARDS protocol Target TVol 6-8cc/kgIBW Target Plateau Pressure < 30cm H20 Target driving pressure less than 15 cm of water Target PaO2 55-65: titrate PEEP/FiO2 per protocol Ventilator associated pneumonia prevention protocol Sedation decreasing. Put Upper Limits of midazolam infusion and fentanyl infusions. Encouraged boluses Added Precedex try to get off of Versed today. Continue  sitter SBT SAT tomorrow morning  for liberation from mechanical support.  I believe he is close at extubation.  Uncontrolled hypertension at baseline Now hypotensive, likely sedation related PLAN -  Holding BP meds  Elevated troponin, suspect supply demand mismatch - Unfortunately not a candidate for intervention at this time PLAN -  Heparin DVT prophylaxis  AKI:  Kidney function improving Follow urine output Additional doses of Lasix today  Elevated D-dimer DVT prophylaxis Heparin  Hyperglycemia Levemir plus SSI Goal CBG 140-180   Best practice (evaluated daily)  Diet: Tube feeding per protocol Pain/Anxiety/Delirium protocol (if indicated): Fentanyl, propofol VAP protocol (if indicated): In place DVT prophylaxis: heparin GI prophylaxis: Pepcid Glucose control: SSI Mobility: Bedrest Disposition: Full code  Called and spoke with patient's wife via phone this morning 01/01/2019.  Goals of Care:  Last date of multidisciplinary goals of care discussion:1/11 Family and staff present: Patient's wife, daughter over the phone Summary of discussion:Continue full aggressive care Follow up goals of care discussion due: 1/18 Code Status: Full code  Labs   CBC: Recent Labs  Lab 12/26/20 0500 12/27/20 0207 12/27/20 0211 12/27/20 1138 12/27/20 1600 12/28/20 0330 12/28/20 0444 12/28/20 0844 12/29/20 0516 12/30/20 0504 01/01/21 0352  WBC 6.6  --  9.9  --  17.9* 20.0*  --   --  18.0* 10.7* 8.5  NEUTROABS 5.6  --  9.3*  --   --  18.5*  --   --  16.6* 9.7*  --   HGB 16.7   < > 16.9   < > 17.1* 16.1 16.0 16.0 15.5 13.9 14.6  HCT 47.5   < > 49.2   < > 49.0 46.7 47.0 47.0 45.4 40.7 43.6  MCV 90.6  --  89.6  --  90.1 92.8  --   --  93.6 93.3 95.0  PLT 191  --  207  --  291 295  --   --  226 200 160   < > = values in this interval not displayed.    Basic Metabolic Panel: Recent Labs  Lab 12/26/20 0500 12/27/20 0207 12/27/20 0211 12/27/20 1138 12/28/20 0330  12/28/20 0444 12/29/20 0516 12/30/20 0504 12/30/20 1545 12/31/20 0444 01/01/21 0352  NA 137   < > 137   < > 138   < > 140 140 142 144 150*  K 4.0   < > 4.3   < > 4.5   < > 4.5 4.4 4.3 4.7 4.8  CL 102  --  105  --  100  --  102 103 107 108 112*  CO2 24  --  23  --  26  --  26 25 25 25 28   GLUCOSE 125*  --  174*  --  255*  --  299* 321* 326* 256* 258*  BUN 26*  --  23  --  49*  --  83* 111* 110* 114* 113*  CREATININE 1.11  --  1.02   < > 2.23*  --  2.65* 2.57* 2.27* 2.04* 1.92*  CALCIUM 8.8*  --  9.0  --  8.6*  --  8.5* 8.5* 8.5* 8.9 9.2  MG 2.2  --  2.1  --  2.8*  --  3.0* 3.1* 3.0* 3.1*  --   PHOS 3.1  --  3.0  --  6.2*  --  7.3* 5.5*  --   --   --    < > = values in this interval not displayed.   GFR: Estimated Creatinine Clearance: 43 mL/min (A) (by C-G formula  based on SCr of 1.92 mg/dL (H)). Recent Labs  Lab 12/25/20 1247 12/26/20 0500 12/28/20 0330 12/29/20 0516 12/30/20 0504 01/01/21 0352  PROCALCITON 0.34  --   --   --   --   --   WBC  --    < > 20.0* 18.0* 10.7* 8.5   < > = values in this interval not displayed.    Liver Function Tests: Recent Labs  Lab 12/28/20 0330 12/29/20 0516 12/30/20 0504 12/31/20 0444 01/01/21 0352  AST 38 22 20 21 21   ALT 33 32 23 25 25   ALKPHOS 109 86 63 57 58  BILITOT 0.8 0.5 0.8 0.8 0.8  PROT 5.4* 5.3* 4.9* 5.0* 5.1*  ALBUMIN 2.7* 2.6* 2.5* 2.5* 2.5*   No results for input(s): LIPASE, AMYLASE in the last 168 hours. No results for input(s): AMMONIA in the last 168 hours.  ABG    Component Value Date/Time   PHART 7.279 (L) 12/28/2020 0844   PCO2ART 58.5 (H) 12/28/2020 0844   PO2ART 108 12/28/2020 0844   HCO3 27.7 12/28/2020 0844   TCO2 30 12/28/2020 0844   ACIDBASEDEF 1.0 12/28/2020 0844   O2SAT 98.0 12/28/2020 0844     Coagulation Profile: No results for input(s): INR, PROTIME in the last 168 hours.  Cardiac Enzymes: No results for input(s): CKTOTAL, CKMB, CKMBINDEX, TROPONINI in the last 168 hours.  HbA1C: Hgb  A1c MFr Bld  Date/Time Value Ref Range Status  12/27/2020 02:49 AM 6.5 (H) 4.8 - 5.6 % Final    Comment:    (NOTE) Pre diabetes:          5.7%-6.4%  Diabetes:              >6.4%  Glycemic control for   <7.0% adults with diabetes   08/07/2013 02:52 PM 5.4 <5.7 % Final    Comment:    (NOTE)                                                                       According to the ADA Clinical Practice Recommendations for 2011, when HbA1c is used as a screening test:  >=6.5%   Diagnostic of Diabetes Mellitus           (if abnormal result is confirmed) 5.7-6.4%   Increased risk of developing Diabetes Mellitus References:Diagnosis and Classification of Diabetes Mellitus,Diabetes Care,2011,34(Suppl 1):S62-S69 and Standards of Medical Care in         Diabetes - 2011,Diabetes Care,2011,34 (Suppl 1):S11-S61.    CBG: Recent Labs  Lab 12/31/20 1622 12/31/20 1942 12/31/20 2354 01/01/21 0406 01/01/21 0759  GLUCAP 237* 304* 278* 233* 191*    This patient is critically ill with multiple organ system failure; which, requires frequent high complexity decision making, assessment, support, evaluation, and titration of therapies. This was completed through the application of advanced monitoring technologies and extensive interpretation of multiple databases. During this encounter critical care time was devoted to patient care services described in this note for 32 minutes.  01/03/21, DO [facility] 01/01/2021 10:33 AM

## 2021-01-02 DIAGNOSIS — J9601 Acute respiratory failure with hypoxia: Secondary | ICD-10-CM | POA: Diagnosis not present

## 2021-01-02 DIAGNOSIS — J80 Acute respiratory distress syndrome: Secondary | ICD-10-CM | POA: Diagnosis not present

## 2021-01-02 DIAGNOSIS — U071 COVID-19: Secondary | ICD-10-CM | POA: Diagnosis not present

## 2021-01-02 LAB — COMPREHENSIVE METABOLIC PANEL
ALT: 35 U/L (ref 0–44)
AST: 29 U/L (ref 15–41)
Albumin: 2.8 g/dL — ABNORMAL LOW (ref 3.5–5.0)
Alkaline Phosphatase: 65 U/L (ref 38–126)
Anion gap: 10 (ref 5–15)
BUN: 110 mg/dL — ABNORMAL HIGH (ref 8–23)
CO2: 31 mmol/L (ref 22–32)
Calcium: 9.8 mg/dL (ref 8.9–10.3)
Chloride: 110 mmol/L (ref 98–111)
Creatinine, Ser: 1.87 mg/dL — ABNORMAL HIGH (ref 0.61–1.24)
GFR, Estimated: 38 mL/min — ABNORMAL LOW (ref >60)
Glucose, Bld: 276 mg/dL — ABNORMAL HIGH (ref 70–99)
Potassium: 5.2 mmol/L — ABNORMAL HIGH (ref 3.5–5.1)
Sodium: 151 mmol/L — ABNORMAL HIGH (ref 135–145)
Total Bilirubin: 1 mg/dL (ref 0.3–1.2)
Total Protein: 5.7 g/dL — ABNORMAL LOW (ref 6.5–8.1)

## 2021-01-02 LAB — GLUCOSE, CAPILLARY
Glucose-Capillary: 233 mg/dL — ABNORMAL HIGH (ref 70–99)
Glucose-Capillary: 235 mg/dL — ABNORMAL HIGH (ref 70–99)
Glucose-Capillary: 318 mg/dL — ABNORMAL HIGH (ref 70–99)
Glucose-Capillary: 306 mg/dL — ABNORMAL HIGH (ref 70–99)
Glucose-Capillary: 263 mg/dL — ABNORMAL HIGH (ref 70–99)
Glucose-Capillary: 322 mg/dL — ABNORMAL HIGH (ref 70–99)

## 2021-01-02 LAB — CBC
HCT: 50.5 % (ref 39.0–52.0)
Hemoglobin: 16.9 g/dL (ref 13.0–17.0)
MCH: 31.4 pg (ref 26.0–34.0)
MCHC: 33.5 g/dL (ref 30.0–36.0)
MCV: 93.9 fL (ref 80.0–100.0)
Platelets: 140 10*3/uL — ABNORMAL LOW (ref 150–400)
RBC: 5.38 MIL/uL (ref 4.22–5.81)
RDW: 13.1 % (ref 11.5–15.5)
WBC: 8.1 10*3/uL (ref 4.0–10.5)
nRBC: 0 % (ref 0.0–0.2)

## 2021-01-02 MED ORDER — LABETALOL HCL 5 MG/ML IV SOLN
10.0000 mg | Freq: Once | INTRAVENOUS | Status: AC
  Administered 2021-01-02: 10 mg via INTRAVENOUS
  Filled 2021-01-02: qty 4

## 2021-01-02 MED ORDER — INSULIN DETEMIR 100 UNIT/ML ~~LOC~~ SOLN
30.0000 [IU] | Freq: Two times a day (BID) | SUBCUTANEOUS | Status: DC
  Administered 2021-01-02 (×2): 30 [IU] via SUBCUTANEOUS
  Filled 2021-01-02 (×4): qty 0.3

## 2021-01-02 MED ORDER — FREE WATER
200.0000 mL | Status: DC
  Administered 2021-01-02 – 2021-01-03 (×6): 200 mL

## 2021-01-02 MED ORDER — CARVEDILOL 12.5 MG PO TABS
12.5000 mg | ORAL_TABLET | Freq: Two times a day (BID) | ORAL | Status: DC
  Administered 2021-01-02 – 2021-01-04 (×5): 12.5 mg
  Filled 2021-01-02 (×6): qty 1

## 2021-01-02 MED ORDER — QUETIAPINE FUMARATE 50 MG PO TABS
50.0000 mg | ORAL_TABLET | Freq: Every day | ORAL | Status: DC
  Administered 2021-01-03: 50 mg
  Filled 2021-01-02: qty 1

## 2021-01-02 MED ORDER — MIDAZOLAM HCL 2 MG/2ML IJ SOLN
2.0000 mg | Freq: Once | INTRAMUSCULAR | Status: AC
  Administered 2021-01-02: 2 mg via INTRAVENOUS

## 2021-01-02 MED ORDER — QUETIAPINE FUMARATE 50 MG PO TABS
50.0000 mg | ORAL_TABLET | Freq: Every day | ORAL | Status: DC
  Administered 2021-01-02: 50 mg via ORAL
  Filled 2021-01-02: qty 1

## 2021-01-02 MED ORDER — CARVEDILOL 12.5 MG PO TABS: 12.5000 mg | ORAL_TABLET | Freq: Two times a day (BID) | ORAL | Status: DC

## 2021-01-02 MED ORDER — FUROSEMIDE 10 MG/ML IJ SOLN
40.0000 mg | Freq: Four times a day (QID) | INTRAMUSCULAR | Status: AC
  Administered 2021-01-02 (×2): 40 mg via INTRAVENOUS
  Filled 2021-01-02 (×2): qty 4

## 2021-01-02 MED ORDER — FENTANYL 2500MCG IN NS 250ML (10MCG/ML) PREMIX INFUSION
0.0000 ug/h | INTRAVENOUS | Status: DC
  Administered 2021-01-02 (×2): 150 ug/h via INTRAVENOUS
  Filled 2021-01-02: qty 250

## 2021-01-02 MED ORDER — METHYLPREDNISOLONE SODIUM SUCC 40 MG IJ SOLR
40.0000 mg | Freq: Every day | INTRAMUSCULAR | Status: AC
  Administered 2021-01-02 – 2021-01-04 (×3): 40 mg via INTRAVENOUS
  Filled 2021-01-02 (×3): qty 1

## 2021-01-02 NOTE — Progress Notes (Signed)
NAME:  Mario Torres, MRN:  [id number], DOB:  Nov 14, 1951, LOS: 8 ADMISSION DATE:  12/28/2020, CONSULTATION DATE:  12/28/2019 REFERRING MD:  Dr Loney Loh, CHIEF COMPLAINT:  Worsening shortness of breath and hypoxia  Brief History:  70 year old with hypertension hyperlipidemia unvaccinated presented 1/8 with COVID-pneumonia now with acute hypoxic respiratory failure requiring intubation on 1/11  Past Medical History:  Hyperlipidemia Hypertension Coronary artery disease  Significant Hospital Events:  Endotracheal intubation 12/27/2020 Right IJ placement 12/27/2020  Consults:    Procedures:  Right IJ 1/11>>> Endotracheal intubation 1/11>>>  Significant Diagnostic Tests:   1/9 CT angiogram of chest: Diffuse bilateral ground-glass airspace opacities. Differential includes atypical pneumonias such as viral or fungal, interstitial pneumonias, edema related to volume overload/CHF, chronic interstitial diseases, hypersensitivity pneumonitis, and respiratory bronchiolitis. Favor diffuse bilateral COVID-related pneumonia. No pulmonary embolism, with mild study limitations detailed above. Cardiomegaly. Three-vessel coronary artery calcifications. Bilateral renal masses, incompletely imaged, presumed cysts. Consider nonemergent follow-up with renal CT or MRI after current pulmonary issues are resolved   Micro Data:  SARS coronavirus positive  Antimicrobials:  Azithromycin 1/9 > Remdesivir 1/9 > Tocilizumab 1/9  Interim History / Subjective:   Patient remains critically ill intubated on mechanical life support.  Currently tolerating pressure support trial but remained very agitated, tachycardic and hypertensive  Objective   Blood pressure 140/86, pulse 75, temperature 97.8 F (36.6 C), temperature source Axillary, resp. rate (!) 29, height 5\' 9"  (1.753 m), weight 100.9 kg, SpO2 93 %.    Vent Mode: PRVC FiO2 (%):  [40 %] 40 % Set Rate:  [35 bmp] 35 bmp Vt Set:  [420 mL] 420 mL PEEP:   [5 cmH20] 5 cmH20 Plateau Pressure:  [15 cmH20-21 cmH20] 19 cmH20   Intake/Output Summary (Last 24 hours) at 01/02/2021 1010 Last data filed at 01/02/2021 0900 Gross per 24 hour  Intake 2278.51 ml  Output 5425 ml  Net -3146.49 ml   Filed Weights   12/31/20 0450 01/01/21 0400 01/02/21 0500  Weight: 104.6 kg 103.3 kg 100.9 kg    Examination: General: Elderly male, orally intubated on mechanical life support, remain agitated HENT: Atraumatic, normocephalic, endotracheal tube in place Lungs: Bilateral mechanically ventilated breath sounds, no wheezes or rhonchi Cardiovascular: Tachycardic, regular rhythm, no murmur Abdomen: Soft, nontender nondistended bowel sounds present Extremities: No significant edema Neuro: Eyes closed, not following commands, remained agitated, moving all 4 extremities spontaneously Skin: No rash  Resolved Hospital Problem list     Assessment & Plan:   Acute hypoxic/hypercapnic respiratory failure due to ARDS from COVID-19 pneumonia Tolerating pressure support trial but his mental status preventing from extubation Continue to remain on pressure support mode of ventilator Target TVol 6-8cc/kgIBW Target Plateau Pressure < 30cm H20 Target driving pressure less than 15 cm of water Target PaO2 55-65: titrate PEEP/FiO2 per protocol Decrease Solu-Medrol to 40 mg daily  Uncontrolled hypertension at baseline Resume Coreg 12.5 mg twice daily  Elevated troponin, suspect supply demand mismatch Unfortunately not a candidate for intervention at this time Continue to monitor  AKI Hyperkalemia/hypernatremia Serum creatinine continue to improve, with good urine output Continue IV Lasix 1 dose of Lokelma Started on free water via G-tube  New diagnosis of diabetes type 2 hyperglycemia caused by steroid Increase Levemir, continue SSI Goal CBG 140-180   Best practice (evaluated daily)  Diet: Tube feeding per protocol Pain/Anxiety/Delirium protocol (if  indicated): Precedex VAP protocol (if indicated): In place DVT prophylaxis: heparin GI prophylaxis: Pepcid Glucose control: Basal and bolus Mobility: Bedrest Disposition:  Full code  Goals of Care:  Last date of multidisciplinary goals of care discussion:1/17 Family and staff present: Patient's wife over the phone Summary of discussion:Continue full aggressive care Follow up goals of care discussion due: 1/25 Code Status: Full code  Labs   CBC: Recent Labs  Lab 12/27/20 0211 12/27/20 1138 12/28/20 0330 12/28/20 0444 12/28/20 0844 12/29/20 0516 12/30/20 0504 01/01/21 0352 01/02/21 0305  WBC 9.9   < > 20.0*  --   --  18.0* 10.7* 8.5 8.1  NEUTROABS 9.3*  --  18.5*  --   --  16.6* 9.7*  --   --   HGB 16.9   < > 16.1   < > 16.0 15.5 13.9 14.6 16.9  HCT 49.2   < > 46.7   < > 47.0 45.4 40.7 43.6 50.5  MCV 89.6   < > 92.8  --   --  93.6 93.3 95.0 93.9  PLT 207   < > 295  --   --  226 200 160 140*   < > = values in this interval not displayed.    Basic Metabolic Panel: Recent Labs  Lab 12/27/20 0211 12/27/20 1138 12/28/20 0330 12/28/20 0444 12/29/20 0516 12/30/20 0504 12/30/20 1545 12/31/20 0444 01/01/21 0352 01/02/21 0305  NA 137   < > 138   < > 140 140 142 144 150* 151*  K 4.3   < > 4.5   < > 4.5 4.4 4.3 4.7 4.8 5.2*  CL 105  --  100  --  102 103 107 108 112* 110  CO2 23  --  26  --  26 25 25 25 28 31   GLUCOSE 174*  --  255*  --  299* 321* 326* 256* 258* 276*  BUN 23  --  49*  --  83* 111* 110* 114* 113* 110*  CREATININE 1.02   < > 2.23*  --  2.65* 2.57* 2.27* 2.04* 1.92* 1.87*  CALCIUM 9.0  --  8.6*  --  8.5* 8.5* 8.5* 8.9 9.2 9.8  MG 2.1  --  2.8*  --  3.0* 3.1* 3.0* 3.1*  --   --   PHOS 3.0  --  6.2*  --  7.3* 5.5*  --   --   --   --    < > = values in this interval not displayed.   GFR: Estimated Creatinine Clearance: 43.7 mL/min (A) (by C-G formula based on SCr of 1.87 mg/dL (H)). Recent Labs  Lab 12/29/20 0516 12/30/20 0504 01/01/21 0352  01/02/21 0305  WBC 18.0* 10.7* 8.5 8.1    Liver Function Tests: Recent Labs  Lab 12/29/20 0516 12/30/20 0504 12/31/20 0444 01/01/21 0352 01/02/21 0305  AST 22 20 21 21 29   ALT 32 23 25 25  35  ALKPHOS 86 63 57 58 65  BILITOT 0.5 0.8 0.8 0.8 1.0  PROT 5.3* 4.9* 5.0* 5.1* 5.7*  ALBUMIN 2.6* 2.5* 2.5* 2.5* 2.8*   No results for input(s): LIPASE, AMYLASE in the last 168 hours. No results for input(s): AMMONIA in the last 168 hours.  ABG    Component Value Date/Time   PHART 7.279 (L) 12/28/2020 0844   PCO2ART 58.5 (H) 12/28/2020 0844   PO2ART 108 12/28/2020 0844   HCO3 27.7 12/28/2020 0844   TCO2 30 12/28/2020 0844   ACIDBASEDEF 1.0 12/28/2020 0844   O2SAT 98.0 12/28/2020 0844     Coagulation Profile: No results for input(s): INR, PROTIME in the last 168 hours.  Cardiac Enzymes: No results for input(s): CKTOTAL, CKMB, CKMBINDEX, TROPONINI in the last 168 hours.  HbA1C: Hgb A1c MFr Bld  Date/Time Value Ref Range Status  12/27/2020 02:49 AM 6.5 (H) 4.8 - 5.6 % Final    Comment:    (NOTE) Pre diabetes:          5.7%-6.4%  Diabetes:              >6.4%  Glycemic control for   <7.0% adults with diabetes   08/07/2013 02:52 PM 5.4 <5.7 % Final    Comment:    (NOTE)                                                                       According to the ADA Clinical Practice Recommendations for 2011, when HbA1c is used as a screening test:  >=6.5%   Diagnostic of Diabetes Mellitus           (if abnormal result is confirmed) 5.7-6.4%   Increased risk of developing Diabetes Mellitus References:Diagnosis and Classification of Diabetes Mellitus,Diabetes Care,2011,34(Suppl 1):S62-S69 and Standards of Medical Care in         Diabetes - 2011,Diabetes Care,2011,34 (Suppl 1):S11-S61.    CBG: Recent Labs  Lab 01/01/21 1551 01/01/21 1925 01/01/21 2333 01/02/21 0308 01/02/21 0729  GLUCAP 138* 252* 216* 233* 235*    Total critical care time: 43 minutes  Performed  by: Cheri Fowler   Critical care time was exclusive of separately billable procedures and treating other patients.   Critical care was necessary to treat or prevent imminent or life-threatening deterioration.   Critical care was time spent personally by me on the following activities: development of treatment plan with patient and/or surrogate as well as nursing, discussions with consultants, evaluation of patient's response to treatment, examination of patient, obtaining history from patient or surrogate, ordering and performing treatments and interventions, ordering and review of laboratory studies, ordering and review of radiographic studies, pulse oximetry and re-evaluation of patient's condition.   Cheri Fowler MD [facility] Pager: [phone] Mobile: [phone]

## 2021-01-03 ENCOUNTER — Inpatient Hospital Stay (HOSPITAL_COMMUNITY): Payer: Medicare Other

## 2021-01-03 DIAGNOSIS — R4182 Altered mental status, unspecified: Secondary | ICD-10-CM

## 2021-01-03 DIAGNOSIS — G9341 Metabolic encephalopathy: Secondary | ICD-10-CM

## 2021-01-03 DIAGNOSIS — G9340 Encephalopathy, unspecified: Secondary | ICD-10-CM

## 2021-01-03 LAB — BASIC METABOLIC PANEL
Anion gap: 10 (ref 5–15)
BUN: 111 mg/dL — ABNORMAL HIGH (ref 8–23)
CO2: 31 mmol/L (ref 22–32)
Calcium: 9.9 mg/dL (ref 8.9–10.3)
Chloride: 111 mmol/L (ref 98–111)
Creatinine, Ser: 2 mg/dL — ABNORMAL HIGH (ref 0.61–1.24)
GFR, Estimated: 35 mL/min — ABNORMAL LOW (ref >60)
Glucose, Bld: 258 mg/dL — ABNORMAL HIGH (ref 70–99)
Potassium: 5.2 mmol/L — ABNORMAL HIGH (ref 3.5–5.1)
Sodium: 152 mmol/L — ABNORMAL HIGH (ref 135–145)

## 2021-01-03 LAB — GLUCOSE, CAPILLARY
Glucose-Capillary: 235 mg/dL — ABNORMAL HIGH (ref 70–99)
Glucose-Capillary: 178 mg/dL — ABNORMAL HIGH (ref 70–99)
Glucose-Capillary: 180 mg/dL — ABNORMAL HIGH (ref 70–99)
Glucose-Capillary: 170 mg/dL — ABNORMAL HIGH (ref 70–99)
Glucose-Capillary: 179 mg/dL — ABNORMAL HIGH (ref 70–99)
Glucose-Capillary: 190 mg/dL — ABNORMAL HIGH (ref 70–99)

## 2021-01-03 LAB — POCT I-STAT 7, (LYTES, BLD GAS, ICA,H+H)
Acid-Base Excess: 6 mmol/L — ABNORMAL HIGH (ref 0.0–2.0)
Bicarbonate: 30.8 mmol/L — ABNORMAL HIGH (ref 20.0–28.0)
Calcium, Ion: 1.26 mmol/L (ref 1.15–1.40)
HCT: 53 % — ABNORMAL HIGH (ref 39.0–52.0)
Hemoglobin: 18 g/dL — ABNORMAL HIGH (ref 13.0–17.0)
O2 Saturation: 98 %
Patient temperature: 100
Potassium: 4.8 mmol/L (ref 3.5–5.1)
Sodium: 153 mmol/L — ABNORMAL HIGH (ref 135–145)
TCO2: 32 mmol/L (ref 22–32)
pCO2 arterial: 44.3 mmHg (ref 32.0–48.0)
pH, Arterial: 7.454 — ABNORMAL HIGH (ref 7.350–7.450)
pO2, Arterial: 102 mmHg (ref 83.0–108.0)

## 2021-01-03 MED ORDER — PROPOFOL 10 MG/ML IV BOLUS: 500.0000 mg | Freq: Once | INTRAVENOUS | Status: DC

## 2021-01-03 MED ORDER — INSULIN ASPART 100 UNIT/ML ~~LOC~~ SOLN
4.0000 [IU] | SUBCUTANEOUS | Status: DC
  Administered 2021-01-03 – 2021-01-04 (×7): 4 [IU] via SUBCUTANEOUS

## 2021-01-03 MED ORDER — MIDAZOLAM HCL 2 MG/2ML IJ SOLN
INTRAMUSCULAR | Status: AC
  Filled 2021-01-03: qty 4

## 2021-01-03 MED ORDER — ETOMIDATE 2 MG/ML IV SOLN
INTRAVENOUS | Status: AC
  Filled 2021-01-03: qty 20

## 2021-01-03 MED ORDER — FENTANYL CITRATE (PF) 100 MCG/2ML IJ SOLN
INTRAMUSCULAR | Status: AC
  Filled 2021-01-03: qty 2

## 2021-01-03 MED ORDER — ORAL CARE MOUTH RINSE
15.0000 mL | OROMUCOSAL | Status: DC
  Administered 2021-01-03 – 2021-01-05 (×20): 15 mL via OROMUCOSAL

## 2021-01-03 MED ORDER — INSULIN REGULAR(HUMAN) IN NACL 100-0.9 UT/100ML-% IV SOLN
INTRAVENOUS | Status: DC
  Filled 2021-01-03: qty 100

## 2021-01-03 MED ORDER — ETOMIDATE 2 MG/ML IV SOLN
40.0000 mg | Freq: Once | INTRAVENOUS | Status: AC
  Administered 2021-01-04: 20 mg via INTRAVENOUS

## 2021-01-03 MED ORDER — MIDAZOLAM HCL 2 MG/2ML IJ SOLN
5.0000 mg | Freq: Once | INTRAMUSCULAR | Status: DC
  Filled 2021-01-03: qty 6

## 2021-01-03 MED ORDER — INSULIN ASPART 100 UNIT/ML ~~LOC~~ SOLN
0.0000 [IU] | SUBCUTANEOUS | Status: DC
  Administered 2021-01-03 (×4): 4 [IU] via SUBCUTANEOUS
  Administered 2021-01-04: 15 [IU] via SUBCUTANEOUS
  Administered 2021-01-04: 4 [IU] via SUBCUTANEOUS
  Administered 2021-01-04: 7 [IU] via SUBCUTANEOUS

## 2021-01-03 MED ORDER — VECURONIUM BROMIDE 10 MG IV SOLR
10.0000 mg | Freq: Once | INTRAVENOUS | Status: AC
  Administered 2021-01-04: 10 mg via INTRAVENOUS

## 2021-01-03 MED ORDER — INSULIN DETEMIR 100 UNIT/ML ~~LOC~~ SOLN
30.0000 [IU] | Freq: Two times a day (BID) | SUBCUTANEOUS | Status: DC
  Administered 2021-01-03 – 2021-01-04 (×4): 30 [IU] via SUBCUTANEOUS
  Filled 2021-01-03 (×5): qty 0.3

## 2021-01-03 MED ORDER — FENTANYL CITRATE (PF) 100 MCG/2ML IJ SOLN
200.0000 ug | Freq: Once | INTRAMUSCULAR | Status: DC
  Filled 2021-01-03: qty 4

## 2021-01-03 MED ORDER — FREE WATER
300.0000 mL | Status: DC
  Administered 2021-01-03 – 2021-01-05 (×9): 300 mL

## 2021-01-03 MED ORDER — ROCURONIUM BROMIDE 10 MG/ML (PF) SYRINGE
PREFILLED_SYRINGE | INTRAVENOUS | Status: AC
  Filled 2021-01-03: qty 10

## 2021-01-03 MED ORDER — CHLORHEXIDINE GLUCONATE 0.12% ORAL RINSE (MEDLINE KIT)
15.0000 mL | Freq: Two times a day (BID) | OROMUCOSAL | Status: DC
  Administered 2021-01-03 – 2021-01-05 (×4): 15 mL via OROMUCOSAL

## 2021-01-03 MED ORDER — DEXTROSE 50 % IV SOLN: 0.0000 mL | INTRAVENOUS | Status: DC | PRN

## 2021-01-03 NOTE — Progress Notes (Addendum)
PCCM Brief Progress Note  70 yo M admitted 1/9 for SOB in setting of COVID-19 PNA, transferred to ICU 1/11 and intubated 1/11. Extubated 1/18.   Referred to bedside to evaluate airway protection. Initial eval, with adequate oxygenation, Somnolent, spontaneous ? purposeful movements of BUE. + cough/gag but weak. D/w CCM MD who also evaluated. At that time, decision to remain extubated and follow closely. D/w pts wife who would want re-intubation if indicated + tracheostomy.   Subsequently referred back to bedside for desat + evaluation of airway protection. Again joined by [facility] MD  HR 99 BP 158/93 SpO2 84% on NRB RR 21  G:Ill appearing elderly M, slumped over in bed  N: symmetrical pupils, 80mm. No response to painful stimuli. Weak gag.  HEENT: flushed face. Mallampati II. Anicteric sclera P: Symmetrical chest expansion, trap muscle involvement CV: RRR cap refill < 3 sec 2+ rad pulse GI: soft ndnt  GU: defer Skin: c/d/w   Acute hypoxic respiratory failure + poor airway protection Acute encephalopathy  P -reintubated-- did not require meds -f/u CXR -VAP, pulm hygiene -hold sedation. RASS goal +1 to 0 - Follow neuro status closely, if no improvement with improved oxygenation, will send STAT CT H    Critical Care Time: 44  Tessie Fass MSN, AGACNP-BC [facility] Pulmonary/Critical Care Medicine [phone] If no answer, [phone] 01/03/2021, 11:13 AM

## 2021-01-03 NOTE — Progress Notes (Signed)
Patient transported to CT and back without complications. RN at bedside.  

## 2021-01-03 NOTE — Progress Notes (Signed)
Patient seen and evaluated by myself and Dr. Merrily Pew during morning rounds. Patient was not following commands, and was unresponsive to both painful &  verbal stimulation even after patient's Precedex and Fentanyl drips had been turned off for approximately two hours. Decision was made by Dr. Merrily Pew to precede with extubation with patient's mentation. Patient tolerated extubation initially however, after an hour patient's oxygen began to drop and I called MD and NP back to bedside  And patient  was re-intubated for airway protection. Discussion had with MD about vast change in neuro status from yesterday and decision was made to get stat CT head.

## 2021-01-03 NOTE — Procedures (Signed)
Patient Name: CULLEN LAHAIE  MRN: [id number]  Epilepsy Attending: Charlsie Quest  Referring Physician/Provider: Tessie Fass, NP Date:01/03/2021 Duration: 26.11 mins  Patient history: 70yo M with ams. EEG to evaluate for seizure  Level of alertness:  comatose  AEDs during EEG study: None  Technical aspects: This EEG study was done with scalp electrodes positioned according to the 10-20 International system of electrode placement. Electrical activity was acquired at a sampling rate of 500Hz  and reviewed with a high frequency filter of 70Hz  and a low frequency filter of 1Hz . EEG data were recorded continuously and digitally stored.   Description: EEG showed continuous generalized 6 to 9 Hz theta- alpha activity as well as 2-3Hz  delta slowing.  Hyperventilation and photic stimulation were not performed.     ABNORMALITY -Continuous slow, generalized  IMPRESSION: This study is suggestive of moderate to severe diffuse encephalopathy, nonspecific etiology but could be related to sedation. No seizures or epileptiform discharges were seen throughout the recording.  Lyndel Dancel 

## 2021-01-03 NOTE — Procedures (Signed)
Cortrak  Person Inserting Tube:  Dalia Jollie, RD Tube Type:  Cortrak - 43 inches Tube Location:  Right nare Initial Placement:  Postpyloric Secured by: Bridle Technique Used to Measure Tube Placement:  Documented cm marking at nare/ corner of mouth Cortrak Secured At:  88 cm   No x-ray is required. RN may begin using tube.   If the tube becomes dislodged please keep the tube and contact the Cortrak team at [email or web address] (password [email or web address]) for replacement.  If after hours and replacement cannot be delayed, place a NG tube and confirm placement with an abdominal x-ray.    Vanessa Kick RD, LDN Clinical Nutrition Pager listed in AMION

## 2021-01-03 NOTE — Procedures (Signed)
Intubation Procedure Note  Mario Torres  [id number]  [birth year]  Date:01/03/21  Time:10:57 AM   Provider Performing:Janica Eldred E Andrews Tener    Procedure: Intubation (31500)  Indication(s) Respiratory Failure  Consent Risks of the procedure as well as the alternatives and risks of each were explained to the patient and/or caregiver.  Consent for the procedure was obtained and is signed in the bedside chart Intubation discussed with wife prior to intubation who verbally consents.    Anesthesia none   Time Out Verified patient identification, verified procedure, site/side was marked, verified correct patient position, special equipment/implants available, medications/allergies/relevant history reviewed, required imaging and test results available.   Sterile Technique Usual hand hygeine, masks, and gloves were used   Procedure Description Patient positioned in bed supine.  Sedation given as noted above.  Patient was intubated with endotracheal tube using Glidescope.  View was Grade 2 only posterior commissure .  Number of attempts was 1.  Colorimetric CO2 detector was consistent with tracheal placement.   Complications/Tolerance None; patient tolerated the procedure well. Chest X-ray is ordered to verify placement.   EBL none  Specimen(s) None  Tessie Fass MSN, AGACNP-BC [place] [facility]/Critical Care Medicine [phone] If no answer, [phone] 01/03/2021, 11:03 AM

## 2021-01-03 NOTE — Progress Notes (Signed)
eLink Physician-Brief Progress Note Patient Name: Mario Torres DOB: May 20, 1951 MRN: [id number]   Date of Service  01/03/2021  HPI/Events of Note  Patient needs a.m. labs ordered.  eICU Interventions  Labs for the a.m. ordered.        Thomasene Lot Raynetta Osterloh 01/03/2021, 10:14 PM

## 2021-01-03 NOTE — Progress Notes (Signed)
NAME:  Mario Torres, MRN:  [id number], DOB:  [birth year], LOS: 9 ADMISSION DATE:  2021-01-01, CONSULTATION DATE:  12/28/2019 REFERRING MD:  Dr Loney Loh, CHIEF COMPLAINT:  Worsening shortness of breath and hypoxia  Brief History:  70 year old with hypertension hyperlipidemia unvaccinated presented 1/8 with COVID-pneumonia now with acute hypoxic respiratory failure requiring intubation on 1/11  Past Medical History:  Hyperlipidemia Hypertension Coronary artery disease  Significant Hospital Events:  Endotracheal intubation 12/27/2020 Right IJ placement 12/27/2020  Consults:    Procedures:  Right IJ 1/11>>> Endotracheal intubation 1/11>> 11/18  Significant Diagnostic Tests:   1/9 CT angiogram of chest: Diffuse bilateral ground-glass airspace opacities. Differential includes atypical pneumonias such as viral or fungal, interstitial pneumonias, edema related to volume overload/CHF, chronic interstitial diseases, hypersensitivity pneumonitis, and respiratory bronchiolitis. Favor diffuse bilateral COVID-related pneumonia. No pulmonary embolism, with mild study limitations detailed above. Cardiomegaly. Three-vessel coronary artery calcifications. Bilateral renal masses, incompletely imaged, presumed cysts. Consider nonemergent follow-up with renal CT or MRI after current pulmonary issues are resolved   Micro Data:  SARS coronavirus positive  Antimicrobials:  Azithromycin 1/9 > Remdesivir 1/9 > Tocilizumab 1/9  Interim History / Subjective:   Patient remained agitated yesterday, he tolerated pressure support for 3 hours but then became hypoxic, had to be restarted on Precedex and fentanyl infusion.  This morning he was put back on pressure support, tolerated well and extubated but he remains severely encephalopathic  Objective   Blood pressure 138/86, pulse 80, temperature 100 F (37.8 C), temperature source Oral, resp. rate (!) 21, height 5\' 9"  (1.753 m), weight 97.9 kg, SpO2 95 %.     Vent Mode: PSV;CPAP FiO2 (%):  [40 %-50 %] 40 % Set Rate:  [35 bmp] 35 bmp Vt Set:  [420 mL] 420 mL PEEP:  [5 cmH20] 5 cmH20 Pressure Support:  [5 cmH20-10 cmH20] 10 cmH20 Plateau Pressure:  [24 cmH20-33 cmH20] 24 cmH20   Intake/Output Summary (Last 24 hours) at 01/03/2021 1032 Last data filed at 01/03/2021 0700 Gross per 24 hour  Intake 3054.92 ml  Output 4325 ml  Net -1270.08 ml   Filed Weights   01/01/21 0400 01/02/21 0500 01/03/21 0500  Weight: 103.3 kg 100.9 kg 97.9 kg    Examination: General: Elderly male, lying on the bed, unresponsive HENT: Atraumatic, normocephalic, dry mucous membranes Lungs: Reduced air entry all over, no wheezes or rhonchi Cardiovascular: Regular rate and rhythm Abdomen: Soft, nontender nondistended bowel sounds present Extremities: No significant edema Neuro: Eyes closed, not following commands, rhythmic movement of bilateral upper extremities no motor response to painful stimuli Skin: No rash  Resolved Hospital Problem list     Assessment & Plan:   Acute hypoxic/hypercapnic respiratory failure due to ARDS from COVID-19 pneumonia Acute metabolic/toxic encephalopathy Patient tolerated pressure support trial this morning, extubated He is on high flow nasal cannula and nonrebreather, with O2 sat in the 90s  He remained severely encephalopathic, not responding to painful stimuli Has very weak gag and cough reflex Very high risk of reintubation because of high risk of aspiration Continue Solu-Medrol 40 mg daily Continue airborne and contact precautions He is status post Tocilizumab on 1/9  Uncontrolled hypertension at baseline Blood pressure is better controlled with resumption of Coreg Closely monitor blood pressure   Elevated troponin, suspect supply demand mismatch Unfortunately not a candidate for intervention at this time Continue to monitor  AKI Hyperkalemia/hypernatremia Patient had good urine output Serum creatinine remain  elevated to 2 Holding of diuretic today Continue Lokelma x2  Repeat serum potassium later today Continue free water via G-tube  New diagnosis of diabetes type 2 hyperglycemia caused by steroid Continue Levemir, continue SSI Goal CBG 140-180   Best practice (evaluated daily)  Diet: Tube feeding per protocol Pain/Anxiety/Delirium protocol (if indicated): Seroquel VAP protocol (if indicated): DVT prophylaxis: heparin GI prophylaxis: Pepcid Glucose control: Basal and bolus Mobility: Bedrest Disposition: Full code  Patient's wife was updated over the phone, he is at high risk of endotracheal intubation and requiring tracheostomy because of severe encephalopathy, family is in agreement Goals of Care:  Last date of multidisciplinary goals of care discussion:1/17 Family and staff present: Patient's wife over the phone Summary of discussion:Continue full aggressive care Follow up goals of care discussion due: 1/25 Code Status: Full code  Labs   CBC: Recent Labs  Lab 12/28/20 0330 12/28/20 0444 12/28/20 0844 12/29/20 0516 12/30/20 0504 01/01/21 0352 01/02/21 0305  WBC 20.0*  --   --  18.0* 10.7* 8.5 8.1  NEUTROABS 18.5*  --   --  16.6* 9.7*  --   --   HGB 16.1   < > 16.0 15.5 13.9 14.6 16.9  HCT 46.7   < > 47.0 45.4 40.7 43.6 50.5  MCV 92.8  --   --  93.6 93.3 95.0 93.9  PLT 295  --   --  226 200 160 140*   < > = values in this interval not displayed.    Basic Metabolic Panel: Recent Labs  Lab 12/28/20 0330 12/28/20 0444 12/29/20 0516 12/30/20 0504 12/30/20 1545 12/31/20 0444 01/01/21 0352 01/02/21 0305 01/03/21 0310  NA 138   < > 140 140 142 144 150* 151* 152*  K 4.5   < > 4.5 4.4 4.3 4.7 4.8 5.2* 5.2*  CL 100  --  102 103 107 108 112* 110 111  CO2 26  --  26 25 25 25 28 31 31   GLUCOSE 255*  --  299* 321* 326* 256* 258* 276* 258*  BUN 49*  --  83* 111* 110* 114* 113* 110* 111*  CREATININE 2.23*  --  2.65* 2.57* 2.27* 2.04* 1.92* 1.87* 2.00*  CALCIUM 8.6*  --   8.5* 8.5* 8.5* 8.9 9.2 9.8 9.9  MG 2.8*  --  3.0* 3.1* 3.0* 3.1*  --   --   --   PHOS 6.2*  --  7.3* 5.5*  --   --   --   --   --    < > = values in this interval not displayed.   GFR: Estimated Creatinine Clearance: 40.2 mL/min (A) (by C-G formula based on SCr of 2 mg/dL (H)). Recent Labs  Lab 12/29/20 0516 12/30/20 0504 01/01/21 0352 01/02/21 0305  WBC 18.0* 10.7* 8.5 8.1    Liver Function Tests: Recent Labs  Lab 12/29/20 0516 12/30/20 0504 12/31/20 0444 01/01/21 0352 01/02/21 0305  AST 22 20 21 21 29   ALT 32 23 25 25  35  ALKPHOS 86 63 57 58 65  BILITOT 0.5 0.8 0.8 0.8 1.0  PROT 5.3* 4.9* 5.0* 5.1* 5.7*  ALBUMIN 2.6* 2.5* 2.5* 2.5* 2.8*   No results for input(s): LIPASE, AMYLASE in the last 168 hours. No results for input(s): AMMONIA in the last 168 hours.  ABG    Component Value Date/Time   PHART 7.279 (L) 12/28/2020 0844   PCO2ART 58.5 (H) 12/28/2020 0844   PO2ART 108 12/28/2020 0844   HCO3 27.7 12/28/2020 0844   TCO2 30 12/28/2020 0844   ACIDBASEDEF 1.0 12/28/2020  0844   O2SAT 98.0 12/28/2020 0844     Coagulation Profile: No results for input(s): INR, PROTIME in the last 168 hours.  Cardiac Enzymes: No results for input(s): CKTOTAL, CKMB, CKMBINDEX, TROPONINI in the last 168 hours.  HbA1C: Hgb A1c MFr Bld  Date/Time Value Ref Range Status  12/27/2020 02:49 AM 6.5 (H) 4.8 - 5.6 % Final    Comment:    (NOTE) Pre diabetes:          5.7%-6.4%  Diabetes:              >6.4%  Glycemic control for   <7.0% adults with diabetes   08/07/2013 02:52 PM 5.4 <5.7 % Final    Comment:    (NOTE)                                                                       According to the ADA Clinical Practice Recommendations for 2011, when HbA1c is used as a screening test:  >=6.5%   Diagnostic of Diabetes Mellitus           (if abnormal result is confirmed) 5.7-6.4%   Increased risk of developing Diabetes Mellitus References:Diagnosis and Classification of  Diabetes Mellitus,Diabetes Care,2011,34(Suppl 1):S62-S69 and Standards of Medical Care in         Diabetes - 2011,Diabetes Care,2011,34 (Suppl 1):S11-S61.    CBG: Recent Labs  Lab 01/02/21 1151 01/02/21 1527 01/02/21 1932 01/02/21 2259 01/03/21 0311  GLUCAP 318* 306* 263* 322* 235*    Total critical care time: 48 minutes  Performed by: Cheri Fowler   Critical care time was exclusive of separately billable procedures and treating other patients.   Critical care was necessary to treat or prevent imminent or life-threatening deterioration.   Critical care was time spent personally by me on the following activities: development of treatment plan with patient and/or surrogate as well as nursing, discussions with consultants, evaluation of patient's response to treatment, examination of patient, obtaining history from patient or surrogate, ordering and performing treatments and interventions, ordering and review of laboratory studies, ordering and review of radiographic studies, pulse oximetry and re-evaluation of patient's condition.   Cheri Fowler MD [place] [facility] Pager: [phone] Mobile: [phone]

## 2021-01-03 NOTE — Progress Notes (Signed)
EEG completed, results pending. 

## 2021-01-03 NOTE — Procedures (Signed)
Extubation Procedure Note  Patient Details:   Name: Mario Torres DOB: 1951/02/18 MRN: [id number]   Airway Documentation:    Vent end date: 01/03/21 Vent end time: 1000   Evaluation  O2 sats: stable throughout Complications: No apparent complications Patient did not tolerate procedure well. Bilateral Breath Sounds: Diminished   No   Patient extubated per MD order. Patient not really responsive to pain. Positive cuff leak. No stridor noted. Periods of apnea with desaturations on 15L [place]. NRB placed with sats at 92%. Some gag with suction. RN and MD at bedside. No new orders at this time.  Mario Torres 01/03/2021, 10:31 AM

## 2021-01-04 DIAGNOSIS — J9601 Acute respiratory failure with hypoxia: Secondary | ICD-10-CM | POA: Diagnosis not present

## 2021-01-04 DIAGNOSIS — R4182 Altered mental status, unspecified: Secondary | ICD-10-CM | POA: Diagnosis not present

## 2021-01-04 DIAGNOSIS — J80 Acute respiratory distress syndrome: Secondary | ICD-10-CM | POA: Diagnosis not present

## 2021-01-04 DIAGNOSIS — G9340 Encephalopathy, unspecified: Secondary | ICD-10-CM | POA: Diagnosis not present

## 2021-01-04 DIAGNOSIS — U071 COVID-19: Secondary | ICD-10-CM | POA: Diagnosis not present

## 2021-01-04 LAB — CBC WITH DIFFERENTIAL/PLATELET
Abs Immature Granulocytes: 0.34 10*3/uL — ABNORMAL HIGH (ref 0.00–0.07)
Basophils Absolute: 0.1 10*3/uL (ref 0.0–0.1)
Basophils Relative: 0 %
Eosinophils Absolute: 0 10*3/uL (ref 0.0–0.5)
Eosinophils Relative: 0 %
HCT: 60.8 % — ABNORMAL HIGH (ref 39.0–52.0)
Hemoglobin: 19.1 g/dL — ABNORMAL HIGH (ref 13.0–17.0)
Immature Granulocytes: 1 %
Lymphocytes Relative: 5 %
Lymphs Abs: 1.5 10*3/uL (ref 0.7–4.0)
MCH: 30.4 pg (ref 26.0–34.0)
MCHC: 31.4 g/dL (ref 30.0–36.0)
MCV: 96.8 fL (ref 80.0–100.0)
Monocytes Absolute: 1.5 10*3/uL — ABNORMAL HIGH (ref 0.1–1.0)
Monocytes Relative: 5 %
Neutro Abs: 25.5 10*3/uL — ABNORMAL HIGH (ref 1.7–7.7)
Neutrophils Relative %: 89 %
Platelets: 74 10*3/uL — ABNORMAL LOW (ref 150–400)
RBC: 6.28 MIL/uL — ABNORMAL HIGH (ref 4.22–5.81)
RDW: 13.2 % (ref 11.5–15.5)
WBC: 28.9 10*3/uL — ABNORMAL HIGH (ref 4.0–10.5)
nRBC: 0 % (ref 0.0–0.2)

## 2021-01-04 LAB — GLUCOSE, CAPILLARY
Glucose-Capillary: 106 mg/dL — ABNORMAL HIGH (ref 70–99)
Glucose-Capillary: 78 mg/dL (ref 70–99)
Glucose-Capillary: 79 mg/dL (ref 70–99)
Glucose-Capillary: 211 mg/dL — ABNORMAL HIGH (ref 70–99)
Glucose-Capillary: 197 mg/dL — ABNORMAL HIGH (ref 70–99)
Glucose-Capillary: 344 mg/dL — ABNORMAL HIGH (ref 70–99)

## 2021-01-04 LAB — BODY FLUID CELL COUNT WITH DIFFERENTIAL
Eos, Fluid: 0 %
Lymphs, Fluid: 2 %
Monocyte-Macrophage-Serous Fluid: 20 % — ABNORMAL LOW (ref 50–90)
Neutrophil Count, Fluid: 78 % — ABNORMAL HIGH (ref 0–25)
Total Nucleated Cell Count, Fluid: 445 cu mm (ref 0–1000)

## 2021-01-04 LAB — MAGNESIUM: Magnesium: 3.1 mg/dL — ABNORMAL HIGH (ref 1.7–2.4)

## 2021-01-04 LAB — BASIC METABOLIC PANEL
Anion gap: 10 (ref 5–15)
BUN: 104 mg/dL — ABNORMAL HIGH (ref 8–23)
CO2: 31 mmol/L (ref 22–32)
Calcium: 10.3 mg/dL (ref 8.9–10.3)
Chloride: 116 mmol/L — ABNORMAL HIGH (ref 98–111)
Creatinine, Ser: 2.05 mg/dL — ABNORMAL HIGH (ref 0.61–1.24)
GFR, Estimated: 34 mL/min — ABNORMAL LOW (ref >60)
Glucose, Bld: 108 mg/dL — ABNORMAL HIGH (ref 70–99)
Potassium: 4.5 mmol/L (ref 3.5–5.1)
Sodium: 157 mmol/L — ABNORMAL HIGH (ref 135–145)

## 2021-01-04 MED ORDER — STERILE WATER FOR INJECTION IJ SOLN
INTRAMUSCULAR | Status: AC
  Filled 2021-01-04: qty 10

## 2021-01-04 MED ORDER — PROPOFOL 500 MG/50ML IV EMUL
INTRAVENOUS | Status: AC
  Filled 2021-01-04: qty 50

## 2021-01-04 MED ORDER — NOREPINEPHRINE 4 MG/250ML-% IV SOLN
0.0000 ug/min | INTRAVENOUS | Status: DC
  Administered 2021-01-04: 8 ug/min via INTRAVENOUS
  Administered 2021-01-05: 5 ug/min via INTRAVENOUS
  Administered 2021-01-05: 40 ug/min via INTRAVENOUS
  Administered 2021-01-05 (×2): 60 ug/min via INTRAVENOUS
  Administered 2021-01-05: 40 ug/min via INTRAVENOUS
  Filled 2021-01-04 (×5): qty 250

## 2021-01-04 MED ORDER — METHYLPREDNISOLONE SODIUM SUCC 40 MG IJ SOLR: 20.0000 mg | Freq: Every day | INTRAMUSCULAR | Status: DC

## 2021-01-04 MED ORDER — ETOMIDATE 2 MG/ML IV SOLN
INTRAVENOUS | Status: AC
  Filled 2021-01-04: qty 20

## 2021-01-04 MED ORDER — DEXTROSE-NACL 5-0.45 % IV SOLN: INTRAVENOUS | Status: DC

## 2021-01-04 MED ORDER — PIPERACILLIN-TAZOBACTAM 3.375 G IVPB
3.3750 g | Freq: Three times a day (TID) | INTRAVENOUS | Status: DC
  Administered 2021-01-04 – 2021-01-05 (×3): 3.375 g via INTRAVENOUS
  Filled 2021-01-04 (×3): qty 50

## 2021-01-04 MED ORDER — NOREPINEPHRINE 4 MG/250ML-% IV SOLN
INTRAVENOUS | Status: AC
  Filled 2021-01-04: qty 250

## 2021-01-04 MED ORDER — PROSOURCE TF PO LIQD
45.0000 mL | Freq: Three times a day (TID) | ORAL | Status: DC
  Administered 2021-01-04 – 2021-01-05 (×2): 45 mL
  Filled 2021-01-04 (×2): qty 45

## 2021-01-04 MED ORDER — PIPERACILLIN-TAZOBACTAM 3.375 G IVPB: 3.3750 g | Freq: Three times a day (TID) | INTRAVENOUS | Status: DC

## 2021-01-04 MED ORDER — VECURONIUM BROMIDE 10 MG IV SOLR
INTRAVENOUS | Status: AC
  Filled 2021-01-04: qty 10

## 2021-01-04 MED ORDER — VITAL 1.5 CAL PO LIQD
1000.0000 mL | ORAL | Status: DC
  Administered 2021-01-04: 1000 mL
  Filled 2021-01-04: qty 1000

## 2021-01-04 NOTE — Progress Notes (Signed)
NAME:  Mario Torres, MRN:  [id number], DOB:  05-27-1951, LOS: 10 ADMISSION DATE:  23-Jan-2021, CONSULTATION DATE:  12/28/2019 REFERRING MD:  Dr Loney Loh, CHIEF COMPLAINT:  Worsening shortness of breath and hypoxia  Brief History:  70 year old with hypertension hyperlipidemia unvaccinated presented 1/8 with COVID-pneumonia now with acute hypoxic respiratory failure requiring intubation on 1/11  Past Medical History:  Hyperlipidemia Hypertension Coronary artery disease  Significant Hospital Events:  Endotracheal intubation 12/27/2020 Right IJ placement 12/27/2020  Consults:    Procedures:  Right IJ 1/11>>> Endotracheal intubation 1/11>> 11/18  Significant Diagnostic Tests:   1/9 CT angiogram of chest: Diffuse bilateral ground-glass airspace opacities. Differential includes atypical pneumonias such as viral or fungal, interstitial pneumonias, edema related to volume overload/CHF, chronic interstitial diseases, hypersensitivity pneumonitis, and respiratory bronchiolitis. Favor diffuse bilateral COVID-related pneumonia. No pulmonary embolism, with mild study limitations detailed above. Cardiomegaly. Three-vessel coronary artery calcifications. Bilateral renal masses, incompletely imaged, presumed cysts. Consider nonemergent follow-up with renal CT or MRI after current pulmonary issues are resolved   Micro Data:  SARS coronavirus positive 1/11 MRSA PCR negative 1/19 respiratory culture >  Antimicrobials:  Azithromycin 1/9 > Remdesivir 1/9 > Tocilizumab 1/9 Zosyn 1/19 >  Interim History / Subjective:   Patient remains severely encephalopathic despite being off sedation for almost 36 hours.  CT head was done yesterday which showed no acute changes, EEG was negative for acute seizure but showing severe encephalopathy Patient had to be reintubated yesterday for airway protection and hypoxemia  Objective   Blood pressure (!) 125/105, pulse 93, temperature 100.3 F (37.9 C),  temperature source Oral, resp. rate (!) 37, height 5\' 9"  (1.753 m), weight 95.7 kg, SpO2 91 %.    Vent Mode: PRVC FiO2 (%):  [50 %-80 %] 50 % Set Rate:  [35 bmp] 35 bmp Vt Set:  [420 mL] 420 mL PEEP:  [5 cmH20] 5 cmH20 Plateau Pressure:  [14 cmH20-18 cmH20] 18 cmH20   Intake/Output Summary (Last 24 hours) at 01/04/2021 1012 Last data filed at 01/04/2021 0600 Gross per 24 hour  Intake 611.32 ml  Output 1870 ml  Net -1258.68 ml   Filed Weights   01/03/21 0500 01/03/21 1301 01/04/21 0500  Weight: 97.9 kg 96.2 kg 95.7 kg    Examination: General: Elderly male, lying on the bed, unresponsive, orally intubated HENT: Atraumatic, normocephalic, dry mucous membranes.  ETT and OGT in place Lungs: Reduced air entry all over, no wheezes or rhonchi Cardiovascular: Regular rate and rhythm Abdomen: Soft, nontender nondistended bowel sounds present Extremities: No significant edema Neuro: Eyes closed, not following commands, rhythmic movement of bilateral upper extremities no motor response to painful stimuli Skin: No rash  Resolved Hospital Problem list   Demand cardiac ischemia  Assessment & Plan:   Acute hypoxic/hypercapnic respiratory failure due to ARDS from COVID-19 pneumonia Acute metabolic/toxic encephalopathy Patient was extubated yesterday but had to be reintubated because of very weak gag and was unable to protect his airway also he became hypoxic after hour of being extubated He is off sedation for more than 36 hours, no change in his mental status CT head and EEG was negative for acute findings Continue Solu-Medrol 20 mg daily Continue airborne and contact precautions He is status post Tocilizumab on 1/9 Spoke with patient's family, will proceed with tracheostomy later today  Uncontrolled hypertension at baseline Blood pressure is better controlled with resumption of Coreg Closely monitor blood pressure   AKI Hyperkalemia/hypernatremia Patient had good urine  output Serum creatinine remain elevated  to 2 Clinically he looks a dry Will hold off diuretics Will start him on D5 half NS to correct hyper natremia and dehydration Serum potassium improved, now it is 4.5  New diagnosis of diabetes type 2 hyperglycemia caused by steroid Continue Levemir, continue SSI Goal CBG 140-180   Best practice (evaluated daily)  Diet: Tube feeding per protocol Pain/Anxiety/Delirium protocol (if indicated): Precedex infusion at low-dose VAP protocol (if indicated): Ordered DVT prophylaxis: Subcu heparin GI prophylaxis: Pepcid Glucose control: Basal and bolus Mobility: Bedrest Disposition: Full code  Goals of Care:  Last date of multidisciplinary goals of care discussion:1/17 Family and staff present: Patient's wife over the phone Summary of discussion:Continue full aggressive care Follow up goals of care discussion due: 1/25 Code Status: Full code  Labs   CBC: Recent Labs  Lab 12/29/20 0516 12/30/20 0504 01/01/21 0352 01/02/21 0305 01/03/21 1148 01/04/21 0402  WBC 18.0* 10.7* 8.5 8.1  --  28.9*  NEUTROABS 16.6* 9.7*  --   --   --  25.5*  HGB 15.5 13.9 14.6 16.9 18.0* 19.1*  HCT 45.4 40.7 43.6 50.5 53.0* 60.8*  MCV 93.6 93.3 95.0 93.9  --  96.8  PLT 226 200 160 140*  --  74*    Basic Metabolic Panel: Recent Labs  Lab 12/29/20 0516 12/30/20 0504 12/30/20 1545 12/31/20 0444 01/01/21 0352 01/02/21 0305 01/03/21 0310 01/03/21 1148 01/04/21 0402  NA 140 140 142 144 150* 151* 152* 153* 157*  K 4.5 4.4 4.3 4.7 4.8 5.2* 5.2* 4.8 4.5  CL 102 103 107 108 112* 110 111  --  116*  CO2 26 25 25 25 28 31 31   --  31  GLUCOSE 299* 321* 326* 256* 258* 276* 258*  --  108*  BUN 83* 111* 110* 114* 113* 110* 111*  --  104*  CREATININE 2.65* 2.57* 2.27* 2.04* 1.92* 1.87* 2.00*  --  2.05*  CALCIUM 8.5* 8.5* 8.5* 8.9 9.2 9.8 9.9  --  10.3  MG 3.0* 3.1* 3.0* 3.1*  --   --   --   --  3.1*  PHOS 7.3* 5.5*  --   --   --   --   --   --   --     GFR: Estimated Creatinine Clearance: 38.8 mL/min (A) (by C-G formula based on SCr of 2.05 mg/dL (H)). Recent Labs  Lab 12/30/20 0504 01/01/21 0352 01/02/21 0305 01/04/21 0402  WBC 10.7* 8.5 8.1 28.9*    Liver Function Tests: Recent Labs  Lab 12/29/20 0516 12/30/20 0504 12/31/20 0444 01/01/21 0352 01/02/21 0305  AST 22 20 21 21 29   ALT 32 23 25 25  35  ALKPHOS 86 63 57 58 65  BILITOT 0.5 0.8 0.8 0.8 1.0  PROT 5.3* 4.9* 5.0* 5.1* 5.7*  ALBUMIN 2.6* 2.5* 2.5* 2.5* 2.8*   No results for input(s): LIPASE, AMYLASE in the last 168 hours. No results for input(s): AMMONIA in the last 168 hours.  ABG    Component Value Date/Time   PHART 7.454 (H) 01/03/2021 1148   PCO2ART 44.3 01/03/2021 1148   PO2ART 102 01/03/2021 1148   HCO3 30.8 (H) 01/03/2021 1148   TCO2 32 01/03/2021 1148   ACIDBASEDEF 1.0 12/28/2020 0844   O2SAT 98.0 01/03/2021 1148     Coagulation Profile: No results for input(s): INR, PROTIME in the last 168 hours.  Cardiac Enzymes: No results for input(s): CKTOTAL, CKMB, CKMBINDEX, TROPONINI in the last 168 hours.  HbA1C: Hgb A1c MFr Bld  Date/Time  Value Ref Range Status  12/27/2020 02:49 AM 6.5 (H) 4.8 - 5.6 % Final    Comment:    (NOTE) Pre diabetes:          5.7%-6.4%  Diabetes:              >6.4%  Glycemic control for   <7.0% adults with diabetes   08/07/2013 02:52 PM 5.4 <5.7 % Final    Comment:    (NOTE)                                                                       According to the ADA Clinical Practice Recommendations for 2011, when HbA1c is used as a screening test:  >=6.5%   Diagnostic of Diabetes Mellitus           (if abnormal result is confirmed) 5.7-6.4%   Increased risk of developing Diabetes Mellitus References:Diagnosis and Classification of Diabetes Mellitus,Diabetes Care,2011,34(Suppl 1):S62-S69 and Standards of Medical Care in         Diabetes - 2011,Diabetes Care,2011,34 (Suppl 1):S11-S61.    CBG: Recent Labs   Lab 01/03/21 1542 01/03/21 1939 01/03/21 2313 01/04/21 0334 01/04/21 0803  GLUCAP 170* 179* 190* 106* 78    Total critical care time: 37 minutes  Performed by: Cheri Fowler   Critical care time was exclusive of separately billable procedures and treating other patients.   Critical care was necessary to treat or prevent imminent or life-threatening deterioration.   Critical care was time spent personally by me on the following activities: development of treatment plan with patient and/or surrogate as well as nursing, discussions with consultants, evaluation of patient's response to treatment, examination of patient, obtaining history from patient or surrogate, ordering and performing treatments and interventions, ordering and review of laboratory studies, ordering and review of radiographic studies, pulse oximetry and re-evaluation of patient's condition.   Cheri Fowler MD [facility] Pager: [phone] Mobile: [phone]

## 2021-01-04 NOTE — Progress Notes (Signed)
eLink Physician-Brief Progress Note Patient Name: Mario Torres DOB: 02/23/1951 MRN: [id number]   Date of Service  01/04/2021  HPI/Events of Note  Patient is s/p tracheostomy earlier today, ventilator set rate 35.  eICU Interventions  RT requested to try and see if patient will tolerate a lower set rate now that he is s/p tracheostomy, if he appears comfortable on a lower rate ABG to be obtained 1  Hour following change.        Thomasene Lot Jarian Longoria 01/04/2021, 11:26 PM

## 2021-01-04 NOTE — Progress Notes (Signed)
Patient respiratory rate 42, O2 Sat 91%. Increasing Precedex to 0.6 mcg/kg/hr. PO Oxycodone given at scheduled time for pain management.

## 2021-01-04 NOTE — Progress Notes (Signed)
Patient desynchronous with ventilator. RR 42 despite suctioning and repositioning, O2 Sat 86%. Restarting Precedex at 0.74mcg/kg/hr.

## 2021-01-04 NOTE — Procedures (Signed)
Percutaneous Tracheostomy Procedure Note   Mario Torres  [id number]  21-Feb-1951  Date:01/04/21  Time:1:24 PM   Provider Performing:Cindel Daugherty  Procedure: Percutaneous Tracheostomy with Bronchoscopic Guidance (32122)  Indication(s) Acute respiratory failure  Consent Risks of the procedure as well as the alternatives and risks of each were explained to the patient and/or caregiver.  Consent for the procedure was obtained.  Anesthesia Etomidate, Versed, Fentanyl, Vecuronium   Time Out Verified patient identification, verified procedure, site/side was marked, verified correct patient position, special equipment/implants available, medications/allergies/relevant history reviewed, required imaging and test results available.   Sterile Technique Maximal sterile technique including sterile barrier drape, hand hygiene, sterile gown, sterile gloves, mask, hair covering.    Procedure Description Appropriate anatomy identified by palpation.  Patient's neck prepped and draped in sterile fashion.  1% lidocaine with epinephrine was used to anesthetize skin overlying neck.  1.5cm incision made and blunt dissection performed until tracheal rings could be easily palpated.   Then a size 8 Shiley tracheostomy was placed under bronchoscopic visualization using usual Seldinger technique and serial dilation.   Bronchoscope confirmed placement above the carina.  Tracheostomy was sutured in place with adhesive pad to protect skin under pressure.    Patient connected to ventilator.   Complications/Tolerance None; patient tolerated the procedure well. Chest X-ray is ordered to confirm no post-procedural complication.   EBL Minimal   Specimen(s) None

## 2021-01-04 NOTE — Procedures (Signed)
Diagnostic Bronchoscopy  Mario Torres  [id number]  07-May-1951  Date:01/04/21  Time:2:27 PM   Provider Performing:Kristle Wesch C Kariya Lavergne   Procedure: Diagnostic Bronchoscopy (79038)  Indication(s) Assist with direct visualization of tracheostomy placement  Consent Risks of the procedure as well as the alternatives and risks of each were explained to the patient and/or caregiver.  Consent for the procedure was obtained.   Anesthesia See separate tracheostomy note   Time Out Verified patient identification, verified procedure, site/side was marked, verified correct patient position, special equipment/implants available, medications/allergies/relevant history reviewed, required imaging and test results available.   Sterile Technique Usual hand hygiene, masks, gowns, and gloves were used   Procedure Description Bronchoscope advanced through endotracheal tube and into airway.  Therapeutic suctioning of mucus plugs performed in LUL.  BAL then performed in LLL.  Bronchoscope then used to provide direct visualization of tracheostomy placement.  Findings: Purulent secretions from LLL Mucus plugging of left mainstem suctioned Appropriate position of tracheostomy tube   Complications/Tolerance None; patient tolerated the procedure well.   EBL None  Specimen(s) None

## 2021-01-04 NOTE — Progress Notes (Signed)
Nutrition Follow-up  DOCUMENTATION CODES:   Not applicable  INTERVENTION:   Tube Feeding via Cortrak:  Change to Vital 1.5 at 60 ml/hr Pro-Source TF 45 mL TID Provides 2280 kcals, 130 g of protein and 1094 mL of free water  Total free water with TF plus current FWF of 300 mL q 4 hours: 2894 mL   NUTRITION DIAGNOSIS:   Inadequate oral intake related to acute illness as evidenced by NPO status.  Being addressed via TF   GOAL:   Patient will meet greater than or equal to 90% of their needs  Progressing  MONITOR:   Vent status,Labs,Weight trends,TF tolerance  REASON FOR ASSESSMENT:   Ventilator,Consult Enteral/tube feeding initiation and management  ASSESSMENT:   70 yo unvaccinated male admitted with acute respiratory failure with COVID-19 pneumonia. PMH includes HLD, HTN, CAD  1/11 Admitted, intubated 1/18 Extubated, Re-Intubated, CT head negative, Cortrak placed 1/19 Trach placed  Bronch performed, trach placed today, remains on vent support. On precedex drip  TF on hold for procedure; previously tolerating Vital High Protein. Now off propofol  Hypernatremic; receiving 300 mL free water q 4 hours. IV hydration started today as well  Current wt 95.7 kg; admit wt 106.6 kg  Labs: sodium 157 (H), BUN 104, Creatinine 2.05 Meds: D5-1/2 NS at 50 ml/hr, colace, miralax, ss novolog novolog q 4 hours, levemir, tradjenta, solumedrol  Diet Order:   Diet Order            Diet NPO time specified  Diet effective midnight                 EDUCATION NEEDS:   Not appropriate for education at this time  Skin:  Skin Assessment: Reviewed RN Assessment  Last BM:  1/19 rectal tube  Height:   Ht Readings from Last 1 Encounters:  12/27/20 5\' 9"  (1.753 m)    Weight:   Wt Readings from Last 1 Encounters:  01/04/21 95.7 kg    BMI:  Body mass index is 31.16 kg/m.  Estimated Nutritional Needs:   Kcal:  2200-2400 kcals  Protein:  120-150 g  Fluid:  >/= 1.8  L   01/06/21 MS, RDN, LDN, CNSC Registered Dietitian III Clinical Nutrition RD Pager and On-Call Pager Number Located in [place]

## 2021-01-05 ENCOUNTER — Inpatient Hospital Stay (HOSPITAL_COMMUNITY): Payer: Medicare Other

## 2021-01-05 DIAGNOSIS — U071 COVID-19: Secondary | ICD-10-CM | POA: Diagnosis not present

## 2021-01-05 DIAGNOSIS — G9340 Encephalopathy, unspecified: Secondary | ICD-10-CM | POA: Diagnosis not present

## 2021-01-05 DIAGNOSIS — J80 Acute respiratory distress syndrome: Secondary | ICD-10-CM | POA: Diagnosis not present

## 2021-01-05 LAB — GLUCOSE, CAPILLARY
Glucose-Capillary: 393 mg/dL — ABNORMAL HIGH (ref 70–99)
Glucose-Capillary: 419 mg/dL — ABNORMAL HIGH (ref 70–99)
Glucose-Capillary: 465 mg/dL — ABNORMAL HIGH (ref 70–99)
Glucose-Capillary: 342 mg/dL — ABNORMAL HIGH (ref 70–99)
Glucose-Capillary: 355 mg/dL — ABNORMAL HIGH (ref 70–99)
Glucose-Capillary: 362 mg/dL — ABNORMAL HIGH (ref 70–99)
Glucose-Capillary: 321 mg/dL — ABNORMAL HIGH (ref 70–99)
Glucose-Capillary: 187 mg/dL — ABNORMAL HIGH (ref 70–99)
Glucose-Capillary: 171 mg/dL — ABNORMAL HIGH (ref 70–99)
Glucose-Capillary: 217 mg/dL — ABNORMAL HIGH (ref 70–99)

## 2021-01-05 LAB — CBC WITH DIFFERENTIAL/PLATELET
Abs Immature Granulocytes: 0.26 10*3/uL — ABNORMAL HIGH (ref 0.00–0.07)
Basophils Absolute: 0 10*3/uL (ref 0.0–0.1)
Basophils Relative: 0 %
Eosinophils Absolute: 0 10*3/uL (ref 0.0–0.5)
Eosinophils Relative: 0 %
HCT: 58.7 % — ABNORMAL HIGH (ref 39.0–52.0)
Hemoglobin: 18.4 g/dL — ABNORMAL HIGH (ref 13.0–17.0)
Immature Granulocytes: 1 %
Lymphocytes Relative: 8 %
Lymphs Abs: 2 10*3/uL (ref 0.7–4.0)
MCH: 31 pg (ref 26.0–34.0)
MCHC: 31.3 g/dL (ref 30.0–36.0)
MCV: 99 fL (ref 80.0–100.0)
Monocytes Absolute: 1.5 10*3/uL — ABNORMAL HIGH (ref 0.1–1.0)
Monocytes Relative: 6 %
Neutro Abs: 21 10*3/uL — ABNORMAL HIGH (ref 1.7–7.7)
Neutrophils Relative %: 85 %
Platelets: 56 10*3/uL — ABNORMAL LOW (ref 150–400)
RBC: 5.93 MIL/uL — ABNORMAL HIGH (ref 4.22–5.81)
RDW: 13.4 % (ref 11.5–15.5)
WBC: 24.8 10*3/uL — ABNORMAL HIGH (ref 4.0–10.5)
nRBC: 0 % (ref 0.0–0.2)

## 2021-01-05 LAB — POCT I-STAT 7, (LYTES, BLD GAS, ICA,H+H)
Acid-Base Excess: 3 mmol/L — ABNORMAL HIGH (ref 0.0–2.0)
Bicarbonate: 25.4 mmol/L (ref 20.0–28.0)
Calcium, Ion: 1.2 mmol/L (ref 1.15–1.40)
HCT: 51 % (ref 39.0–52.0)
Hemoglobin: 17.3 g/dL — ABNORMAL HIGH (ref 13.0–17.0)
O2 Saturation: 96 %
Patient temperature: 100
Potassium: 5.5 mmol/L — ABNORMAL HIGH (ref 3.5–5.1)
Sodium: 151 mmol/L — ABNORMAL HIGH (ref 135–145)
TCO2: 26 mmol/L (ref 22–32)
pCO2 arterial: 34.4 mmHg (ref 32.0–48.0)
pH, Arterial: 7.479 — ABNORMAL HIGH (ref 7.350–7.450)
pO2, Arterial: 81 mmHg — ABNORMAL LOW (ref 83.0–108.0)

## 2021-01-05 LAB — BASIC METABOLIC PANEL
Anion gap: 13 (ref 5–15)
BUN: 145 mg/dL — ABNORMAL HIGH (ref 8–23)
CO2: 28 mmol/L (ref 22–32)
Calcium: 9.2 mg/dL (ref 8.9–10.3)
Chloride: 115 mmol/L — ABNORMAL HIGH (ref 98–111)
Creatinine, Ser: 3.22 mg/dL — ABNORMAL HIGH (ref 0.61–1.24)
GFR, Estimated: 20 mL/min — ABNORMAL LOW (ref >60)
Glucose, Bld: 477 mg/dL — ABNORMAL HIGH (ref 70–99)
Potassium: 5.3 mmol/L — ABNORMAL HIGH (ref 3.5–5.1)
Sodium: 156 mmol/L — ABNORMAL HIGH (ref 135–145)

## 2021-01-05 LAB — LACTIC ACID, PLASMA
Lactic Acid, Venous: 3.4 mmol/L (ref 0.5–1.9)
Lactic Acid, Venous: 3.4 mmol/L (ref 0.5–1.9)

## 2021-01-05 LAB — MAGNESIUM: Magnesium: 3.5 mg/dL — ABNORMAL HIGH (ref 1.7–2.4)

## 2021-01-05 LAB — PROCALCITONIN: Procalcitonin: 2.52 ng/mL

## 2021-01-05 MED ORDER — ALBUMIN HUMAN 5 % IV SOLN: 12.5000 g | Freq: Once | INTRAVENOUS | Status: DC

## 2021-01-05 MED ORDER — HYDROCORTISONE NA SUCCINATE PF 100 MG IJ SOLR
50.0000 mg | Freq: Four times a day (QID) | INTRAMUSCULAR | Status: DC
  Administered 2021-01-05: 50 mg via INTRAVENOUS
  Filled 2021-01-05: qty 2

## 2021-01-05 MED ORDER — SODIUM CHLORIDE 0.45 % IV SOLN: INTRAVENOUS | Status: AC

## 2021-01-05 MED ORDER — VASOPRESSIN 20 UNITS/100 ML INFUSION FOR SHOCK
0.0000 [IU]/min | INTRAVENOUS | Status: DC
  Administered 2021-01-05: 0.03 [IU]/min via INTRAVENOUS
  Filled 2021-01-05: qty 100

## 2021-01-05 MED ORDER — FENTANYL 2500MCG IN NS 250ML (10MCG/ML) PREMIX INFUSION
200.0000 ug/h | INTRAVENOUS | Status: DC
  Administered 2021-01-05: 200 ug/h via INTRAVENOUS
  Filled 2021-01-05: qty 250

## 2021-01-05 MED ORDER — DEXTROSE 5 % IV SOLN: INTRAVENOUS | Status: DC

## 2021-01-05 MED ORDER — LINEZOLID 600 MG/300ML IV SOLN
600.0000 mg | Freq: Two times a day (BID) | INTRAVENOUS | Status: DC
  Administered 2021-01-05: 600 mg via INTRAVENOUS
  Filled 2021-01-05 (×2): qty 300

## 2021-01-05 MED ORDER — INSULIN DETEMIR 100 UNIT/ML ~~LOC~~ SOLN
22.0000 [IU] | Freq: Two times a day (BID) | SUBCUTANEOUS | Status: DC
  Administered 2021-01-05: 22 [IU] via SUBCUTANEOUS
  Filled 2021-01-05 (×2): qty 0.22

## 2021-01-05 MED ORDER — VANCOMYCIN HCL 2000 MG/400ML IV SOLN
2000.0000 mg | INTRAVENOUS | Status: DC
  Filled 2021-01-05: qty 400

## 2021-01-05 MED ORDER — FREE WATER
100.0000 mL | Status: DC
  Administered 2021-01-05 (×3): 100 mL

## 2021-01-05 MED ORDER — NOREPINEPHRINE 16 MG/250ML-% IV SOLN
0.0000 ug/min | INTRAVENOUS | Status: DC
  Administered 2021-01-05: 60 ug/min via INTRAVENOUS
  Filled 2021-01-05: qty 250

## 2021-01-05 MED ORDER — SODIUM CHLORIDE 0.9 % IV SOLN: INTRAVENOUS | Status: DC | PRN

## 2021-01-05 MED ORDER — ALBUMIN HUMAN 5 % IV SOLN
12.5000 g | Freq: Once | INTRAVENOUS | Status: AC
  Administered 2021-01-05: 12.5 g via INTRAVENOUS
  Filled 2021-01-05: qty 250

## 2021-01-05 MED ORDER — BISACODYL 10 MG RE SUPP: 10.0000 mg | Freq: Once | RECTAL | Status: DC

## 2021-01-05 MED ORDER — ALBUMIN HUMAN 5 % IV SOLN
12.5000 g | Freq: Once | INTRAVENOUS | Status: AC
  Administered 2021-01-05: 12.5 g via INTRAVENOUS

## 2021-01-05 MED ORDER — ALBUMIN HUMAN 5 % IV SOLN
INTRAVENOUS | Status: AC
  Filled 2021-01-05: qty 250

## 2021-01-05 MED ORDER — DEXTROSE 50 % IV SOLN: 0.0000 mL | INTRAVENOUS | Status: DC | PRN

## 2021-01-05 MED ORDER — INSULIN REGULAR(HUMAN) IN NACL 100-0.9 UT/100ML-% IV SOLN
INTRAVENOUS | Status: DC
  Administered 2021-01-05: 13 [IU]/h via INTRAVENOUS
  Filled 2021-01-05: qty 100

## 2021-01-05 MED ORDER — VANCOMYCIN HCL 1250 MG/250ML IV SOLN: 1250.0000 mg | INTRAVENOUS | Status: DC

## 2021-01-05 MED ORDER — INSULIN ASPART 100 UNIT/ML ~~LOC~~ SOLN
3.0000 [IU] | SUBCUTANEOUS | Status: DC
  Administered 2021-01-05: 9 [IU] via SUBCUTANEOUS

## 2021-01-05 MED ORDER — FENTANYL CITRATE (PF) 100 MCG/2ML IJ SOLN
25.0000 ug | INTRAMUSCULAR | Status: DC | PRN
Start: 2021-01-05 — End: 2021-01-06
  Administered 2021-01-05: 50 ug via INTRAVENOUS
  Filled 2021-01-05: qty 2

## 2021-01-06 LAB — CULTURE, RESPIRATORY W GRAM STAIN
Culture: NORMAL
Culture: NORMAL

## 2021-01-17 NOTE — Progress Notes (Signed)
Notified E-link of increased Levophed requirements currently at 58mcg/min, also inquired about CT Head order as it was mentioned in Dr. Jaynie Crumble note. Notified E-link Sodium level 156 on AM Labs despite free water flushes.

## 2021-01-17 NOTE — Death Summary Note (Signed)
DEATH SUMMARY   Patient Details  Name: Mario Torres MRN: [id number] DOB: 12-06-1951  Admission/Discharge Information   Admit Date:  12-30-2020  Date of Death: Date of Death: 01/11/2021  Time of Death: Time of Death: 1744-02-03  Length of Stay: 02-02-2023  Referring Physician: Vivi Barrack, MD   Reason(s) for Hospitalization  Acute hypoxic/hypercapnic respiratory failure due to ARDS from COVID-19 pneumonia Septic shock due to healthcare associated pneumonia with gram-positive cocci Possible acalculous cholecystitis Acute metabolic/toxic encephalopathy Worsening acute kidney injury Hyperkalemia Hypernatremia New diagnosis of diabetes with hyperglycemia  Diagnoses  Preliminary cause of death: Septic shock due to healthcare associated pneumonia with gram-positive cocci and probable acalculous cholecystitis Secondary Diagnoses (including complications and co-morbidities):  Active Problems:   CAD- s/p PCI w/ DES to circumflex, and RCA in February 02, 2011- patent stents on re-look cath 08/10/13   HTN (hypertension)   HLD (hyperlipidemia)   Acute hypoxemic respiratory failure due to COVID-19 [name]   COVID-19   Aortic atherosclerosis (HCC)   Bilateral renal masses   Acute hypoxemic respiratory failure (HCC)   ARDS (adult respiratory distress syndrome) (HCC)   Encephalopathy acute   Brief Hospital Course (including significant findings, care, treatment, and services provided and events leading to death)  Mario Torres is a 70 y.o. year old male who was admitted with acute hypoxic/hypercapnic respiratory failure from ARDS due to COVID-19 pneumonia, requiring endotracheal intubation and mechanical ventilation.  Patient developed severe metabolic encephalopathy, he was given trial of extubation but could not tolerate and was not able to protect his airway, he underwent tracheostomy placed on 1/19.  On 01/11/2023 patient started getting hypotensive requiring multiple vasopressors due to septic shock from healthcare  associated pneumonia, patient was growing gram-positive cocci and on bedside ultrasonography was noted to have enlarged gallbladder, due to possible acalculous cholecystitis.  Official ultrasonography was requested.  Patient's family was contacted due to worsening condition, they decided to proceed with comfort care considering he did not want to be on ventilator for so long and he had very poor prognosis.  Patient was palliatively extubated on 01/11/2023 and he was declared dead at 5:45 PM on 01/11/21    Pertinent Labs and Studies  Significant Diagnostic Studies CT HEAD WO CONTRAST  Result Date: 01/03/2021 CLINICAL DATA:  Altered mental status. EXAM: CT HEAD WITHOUT CONTRAST TECHNIQUE: Contiguous axial images were obtained from the base of the skull through the vertex without intravenous contrast. COMPARISON:  None. FINDINGS: Brain: No evidence of acute infarction, hemorrhage, hydrocephalus, extra-axial collection or mass lesion/mass effect. Vascular: No hyperdense vessel or unexpected calcification. Skull: Normal. Negative for fracture or focal lesion. Sinuses/Orbits: No acute finding. Other: None. IMPRESSION: Normal head CT. Electronically Signed   By: Marijo Conception M.D.   On: 01/03/2021 12:54   CT Angio Chest PE W and/or Wo Contrast  Result Date: 12/25/2020 CLINICAL DATA:  Pulmonary embolism suspected. EXAM: CT ANGIOGRAPHY CHEST WITH CONTRAST TECHNIQUE: Multidetector CT imaging of the chest was performed using the standard protocol during bolus administration of intravenous contrast. Multiplanar CT image reconstructions and MIPs were obtained to evaluate the vascular anatomy. CONTRAST:  154m OMNIPAQUE IOHEXOL 350 MG/ML SOLN COMPARISON:  None. FINDINGS: Cardiovascular: A segment of the most peripheral segmental and subsegmental pulmonary arteries cannot be definitively characterized due to patient breathing motion artifact, however, there is no pulmonary embolism identified within the main, lobar or  central segmental pulmonary arteries bilaterally. Cardiomegaly. No pericardial effusion. Three-vessel coronary artery calcifications. Mild aortic atherosclerosis. No thoracic aortic aneurysm.  Mediastinum/Nodes: No mass or enlarged lymph nodes are seen within the mediastinum or perihilar regions. Esophagus is unremarkable. Trachea appears normal. Lungs/Pleura: Diffuse bilateral ground-glass airspace opacities. No pleural effusion or pneumothorax. Upper Abdomen: Bilateral renal masses, incompletely imaged, presumed cysts. Limited images of the upper abdomen are otherwise unremarkable. Musculoskeletal: Degenerative spondylosis of the kyphotic thoracolumbar spine, mild to moderate in degree. Review of the MIP images confirms the above findings. IMPRESSION: 1. Diffuse bilateral ground-glass airspace opacities. Differential includes atypical pneumonias such as viral or fungal, interstitial pneumonias, edema related to volume overload/CHF, chronic interstitial diseases, hypersensitivity pneumonitis, and respiratory bronchiolitis. Favor diffuse bilateral COVID-related pneumonia. 2. No pulmonary embolism, with mild study limitations detailed above. 3. Cardiomegaly. Three-vessel coronary artery calcifications. 4. Bilateral renal masses, incompletely imaged, presumed cysts. Consider nonemergent follow-up with renal CT or MRI after current pulmonary issues are resolved. Aortic Atherosclerosis (ICD10-I70.0). Electronically Signed   By: Franki Cabot M.D.   On: 12/25/2020 11:55   DG Chest Port 1 View  Result Date: 2021/01/16 CLINICAL DATA:  Respiratory failure.  Pneumothorax. EXAM: PORTABLE CHEST 1 VIEW COMPARISON:  Chest x-ray 01/03/2021. FINDINGS: Interim removal of endotracheal tube and placement of tracheostomy tube. Tracheostomy tube appears to be in good anatomic position. Interim removal of NG tube and placement of feeding tube. Feeding tube tip below left hemidiaphragm. Right IJ line tip again noted over the right  atrium. Heart size normal. Diffuse bilateral interstitial infiltrates/edema again noted without interim change. No prominent pleural effusion. No pneumothorax. IMPRESSION: 1. Interim removal of endotracheal tube and placement of tracheostomy tube. Tracheostomy tube appears to be in good anatomic position. Interim removal of NG tube and placement of feeding tube. Feeding tube tip below left hemidiaphragm. Right IJ line tip again noted over the right atrium. 2. Diffuse bilateral interstitial infiltrates/edema again noted without interim change. Electronically Signed   By: Marcello Moores  Register   On: 01/16/2021 06:52   DG CHEST PORT 1 VIEW  Result Date: 01/03/2021 CLINICAL DATA:  70 year old male with history of COVID infection. Orogastric tube placed. EXAM: PORTABLE CHEST 1 VIEW COMPARISON:  Chest x-ray 01/01/2021. FINDINGS: New enteric tube in position with tip projecting over the proximal stomach and side port just distal to the gastroesophageal junction. An endotracheal tube is in place with tip 2.0 cm above the carina. There is a right-sided internal jugular central venous catheter with tip terminating in the right atrium. Patchy multifocal ill-defined airspace consolidation and areas of interstitial prominence in the lungs bilaterally, most confluent throughout the mid to lower left lung, slightly improved compared to the prior examination. Possible small left pleural effusion. No right pleural effusion. No pneumothorax. No evidence of pulmonary edema. Heart size is normal. The patient is rotated to the left on today's exam, resulting in distortion of the mediastinal contours and reduced diagnostic sensitivity and specificity for mediastinal pathology. Aortic atherosclerosis. IMPRESSION: 1. Support apparatus, as above. 2. Multilobar bilateral pneumonia (left greater than right), with minimal improved aeration compared to the prior study. 3. Possible small left pleural effusion. Electronically Signed   By: Vinnie Langton M.D.   On: 01/03/2021 11:48   DG CHEST PORT 1 VIEW  Result Date: 01/01/2021 CLINICAL DATA:  70 year old male COVID-69.  Intubated. EXAM: PORTABLE CHEST 1 VIEW COMPARISON:  Portable chest 12/30/2020 and earlier. FINDINGS: Portable AP semi upright view at 0518 hours. Stable lines and tubes. Right IJ central line tip remains at the level of the right atrium. Stable lung volumes and mediastinal contours. Diffuse coarse pulmonary interstitial  opacity with some peripheral confluence. No pneumothorax or pleural effusion. Ventilation mildly improved from 12/27/2020. IMPRESSION: 1. Stable lines and tubes - with Right IJ approach central line tip at the right atrium level. Retract 10 mm for more optimal placement. 2. Bilateral COVID-19 pneumonia with ventilation mildly improved from 12/27/2020. Electronically Signed   By: Genevie Ann M.D.   On: 01/01/2021 07:38   DG Chest Port 1 View  Result Date: 12/30/2020 CLINICAL DATA:  70 year old male COVID-19.  Intubated. EXAM: PORTABLE CHEST 1 VIEW COMPARISON:  Portable chest 12/27/2020 and earlier. FINDINGS: Portable AP semi upright view at 0518 hours. Right IJ approach central line tip projects about 10 mm below the expected cavoatrial junction level, likely within the right atrium. Otherwise stable lines and tubes. Stable lung volumes. Mediastinal contours remain within normal limits. No pneumothorax. Diffuse coarse bilateral pulmonary opacity with patchy peripheral and basilar confluence. Ventilation has mildly improved since 12/27/2020. No pleural effusion is evident. Paucity of bowel gas. IMPRESSION: 1. Right IJ approach central line tip likely within the right atrium, about 10 mm below the expected cavoatrial junction level. Consider retraction. 2. Otherwise stable lines and tubes. 3. Diffuse pneumonia with mildly improved ventilation since 12/27/2020. Electronically Signed   By: Genevie Ann M.D.   On: 12/30/2020 08:02   DG CHEST PORT 1 VIEW  Result Date:  12/27/2020 CLINICAL DATA:  Central line placement. EXAM: PORTABLE CHEST 1 VIEW COMPARISON:  Same day chest radiograph FINDINGS: Right IJ central venous catheter with the tip projecting at the right atrium. Similar position of the endotracheal tube with the tip projecting at the inferior clavicular heads. Gastric tube courses below the diaphragm with the tip projecting in the expected region of the stomach. No visible pneumothorax on this semi erect radiograph. Similar multifocal bilateral patchy airspace opacities. No definite pleural effusions. IMPRESSION: 1. Right IJ central venous catheter with the tip projecting at the right atrium. 2. Similar multifocal bilateral patchy airspace opacities. Electronically Signed   By: Margaretha Sheffield MD   On: 12/27/2020 10:16   DG Chest Portable 1 View  Result Date: 12/27/2020 CLINICAL DATA:  Intubation. EXAM: PORTABLE CHEST 1 VIEW COMPARISON:  12/27/2020. FINDINGS: Endotracheal tube and NG tube in stable position. Heart size normal. Severe diffuse bilateral pulmonary infiltrates are again noted without interim change. No pleural effusion or pneumothorax. IMPRESSION: 1. Endotracheal tube and NG tube in stable position. 2. Severe diffuse bilateral pulmonary infiltrates are again noted without interim change. Electronically Signed   By: Marcello Moores  Register   On: 12/27/2020 09:43   DG CHEST PORT 1 VIEW  Result Date: 12/27/2020 CLINICAL DATA:  Shortness of breath.  COVID-19. EXAM: PORTABLE CHEST 1 VIEW COMPARISON:  12/23/2020 FINDINGS: Worsening bilateral multifocal airspace opacities. Shallow lung inflation. IMPRESSION: Worsening bilateral multifocal airspace opacities. Electronically Signed   By: Ulyses Jarred M.D.   On: 12/27/2020 02:18   DG Chest Portable 1 View  Result Date: 12/30/2020 CLINICAL DATA:  Increasing shortness of breath. COVID positive with recent hospitalization. EXAM: PORTABLE CHEST 1 VIEW COMPARISON:  Chest radiograph 4 days ago 12/20/2020 FINDINGS:  Generalized increase in patchy and interstitial opacities throughout both lungs since prior exam. The questioned right apical mass on recent radiograph is only tentatively visualized, currently overlapping the right first rib. The peripheral opacity in the left mid lung appears more diffuse and less confluent. Mild cardiomegaly with unchanged mediastinal contours. Aortic atherosclerosis. There may be small pleural effusions. No pneumothorax. No acute osseous abnormalities are seen. IMPRESSION: 1. Generalized  increase in patchy and interstitial opacities throughout both lungs since prior exam. Question of small pleural effusions. In the setting of COVID infection favor progressive multifocal pneumonia. An element of pulmonary edema is also considered. 2. The questioned right apical mass on recent radiograph is only tentatively visualized on the current exam. Peripheral left lung opacity on prior is more diffuse. Recommend follow-up chest CT for characterization. Electronically Signed   By: Keith Rake M.D.   On: 12/19/2020 22:23   DG Chest Portable 1 View  Result Date: 12/20/2020 CLINICAL DATA:  Hypoxia EXAM: PORTABLE CHEST 1 VIEW COMPARISON:  08/25/2013 FINDINGS: Lung volumes are small. A 3 cm rounded opacity is seen within the right apex, possibly representing a primary pulmonary mass. This is new from prior examination. There is peripheral opacification of the left mid lung zone which may represent a peripheral infiltrate or a pleural mass. This is new since prior examination. No pneumothorax or pleural effusion. Cardiac size within normal limits. Pulmonary vascularity is normal. IMPRESSION: Right apical mass and left peripheral pulmonary infiltrate versus pleural mass, both new since prior examination. Dedicated contrast enhanced CT examination of the chest is recommended for further evaluation. Electronically Signed   By: Fidela Salisbury MD   On: 12/20/2020 03:20   EEG adult  Result Date:  01/03/2021 Lora Havens, MD     01/03/2021  5:14 PM Patient Name: Mario Torres MRN: [id number] Epilepsy Attending: Lora Havens Referring Physician/Provider: Eliseo Gum, NP Date:01/03/2021 Duration: 26.11 mins Patient history: 70yo M with ams. EEG to evaluate for seizure Level of alertness:  comatose AEDs during EEG study: None Technical aspects: This EEG study was done with scalp electrodes positioned according to the 10-20 International system of electrode placement. Electrical activity was acquired at a sampling rate of _0  and reviewed with a high frequency filter of _1  and a low frequency filter of _2 . EEG data were recorded continuously and digitally stored. Description: EEG showed continuous generalized 6 to 9 Hz theta- alpha activity as well as 2-_3  delta slowing.  Hyperventilation and photic stimulation were not performed.   ABNORMALITY -Continuous slow, generalized IMPRESSION: This study is suggestive of moderate to severe diffuse encephalopathy, nonspecific etiology but could be related to sedation. No seizures or epileptiform discharges were seen throughout the recording. Lora Havens   ECHOCARDIOGRAM COMPLETE  Result Date: 12/20/2020    ECHOCARDIOGRAM REPORT   Patient Name:   Mario Torres [facility] Date of Exam: 12/20/2020 Medical Rec #:  [id number]        Height:       70.0 in Accession #:    [id number]       Weight:       240.0 lb Date of Birth:  [birth year]        BSA:          2.255 m Patient Age:    31 years         BP:           106/71 mmHg Patient Gender: M                HR:           63 bpm. Exam Location:  Inpatient Procedure: 2D Echo, 3D Echo, Cardiac Doppler and Color Doppler Indications:    R55 Syncope  History:        Patient has prior history of Echocardiogram examinations, most  recent 06/03/2018. Previous Myocardial Infarction and CAD,                 Signs/Symptoms:Syncope; Risk Factors:Hypertension. Covid                 positive.  Sonographer:    Roseanna Rainbow RDCS Referring Phys: [id number] AMY N [facility]  Sonographer Comments: Suboptimal parasternal window. IMPRESSIONS  1. Left ventricular ejection fraction, by estimation, is 60 to 65%. Left ventricular ejection fraction by 3D volume is 61 %. The left ventricle has normal function. The left ventricle has no regional wall motion abnormalities. There is moderate asymmetric left ventricular hypertrophy of the basal-septal segment. Left ventricular diastolic parameters are consistent with Grade I diastolic dysfunction (impaired relaxation).  2. Right ventricular systolic function is normal. The right ventricular size is normal. There is normal pulmonary artery systolic pressure. The estimated right ventricular systolic pressure is 15.4 mmHg.  3. The mitral valve is grossly normal. Mild mitral valve regurgitation. No evidence of mitral stenosis.  4. The aortic valve is tricuspid. Aortic valve regurgitation is not visualized. No aortic stenosis is present.  5. The inferior vena cava is normal in size with greater than 50% respiratory variability, suggesting right atrial pressure of 3 mmHg. Comparison(s): No significant change from prior study. FINDINGS  Left Ventricle: Left ventricular ejection fraction, by estimation, is 60 to 65%. Left ventricular ejection fraction by 3D volume is 61 %. The left ventricle has normal function. The left ventricle has no regional wall motion abnormalities. The left ventricular internal cavity size was normal in size. There is moderate asymmetric left ventricular hypertrophy of the basal-septal segment. Left ventricular diastolic parameters are consistent with Grade I diastolic dysfunction (impaired relaxation). Right Ventricle: The right ventricular size is normal. No increase in right ventricular wall thickness. Right ventricular systolic function is normal. There is normal pulmonary artery systolic pressure. The tricuspid regurgitant velocity is 1.88 m/s, and  with an assumed right atrial  pressure of 3 mmHg, the estimated right ventricular systolic pressure is 00.8 mmHg. Left Atrium: Left atrial size was normal in size. Right Atrium: Right atrial size was normal in size. Pericardium: Trivial pericardial effusion is present. Presence of pericardial fat pad. Mitral Valve: The mitral valve is grossly normal. Mild mitral valve regurgitation. No evidence of mitral valve stenosis. Tricuspid Valve: The tricuspid valve is grossly normal. Tricuspid valve regurgitation is trivial. No evidence of tricuspid stenosis. Aortic Valve: The aortic valve is tricuspid. Aortic valve regurgitation is not visualized. No aortic stenosis is present. Pulmonic Valve: The pulmonic valve was grossly normal. Pulmonic valve regurgitation is not visualized. No evidence of pulmonic stenosis. Aorta: The aortic root and ascending aorta are structurally normal, with no evidence of dilitation. Venous: The inferior vena cava is normal in size with greater than 50% respiratory variability, suggesting right atrial pressure of 3 mmHg. IAS/Shunts: The atrial septum is grossly normal.  LEFT VENTRICLE PLAX 2D LVIDd:         4.30 cm         Diastology LVIDs:         2.65 cm         LV e' medial:    4.13 cm/s LV PW:         2.00 cm         LV E/e' medial:  14.2 LV IVS:        1.45 cm         LV e' lateral:   6.90 cm/s  LVOT diam:     1.07 cm         LV E/e' lateral: 8.5 LV SV:         23 LV SV Index:   10 LVOT Area:     0.91 cm        3D Volume EF                                LV 3D EF:    Left                                             ventricular LV Volumes (MOD)                            ejection LV vol d, MOD    60.7 ml                    fraction by A2C:                                        3D volume LV vol d, MOD    67.1 ml                    is 61 %. A4C: LV vol s, MOD    18.1 ml A2C:                           3D Volume EF: LV vol s, MOD    23.4 ml       3D EF:        61 % A4C: LV SV MOD A2C:   42.6 ml LV SV MOD A4C:   67.1 ml LV SV  MOD BP:    43.4 ml RIGHT VENTRICLE             IVC RV S prime:     12.40 cm/s  IVC diam: 2.10 cm TAPSE (M-mode): 2.2 cm LEFT ATRIUM             Index       RIGHT ATRIUM           Index LA diam:        4.50 cm 2.00 cm/m  RA Area:     11.70 cm LA Vol (A2C):   53.6 ml 23.76 ml/m RA Volume:   22.10 ml  9.80 ml/m LA Vol (A4C):   53.1 ml 23.54 ml/m LA Biplane Vol: 53.4 ml 23.68 ml/m  AORTIC VALVE LVOT Vmax:   128.00 cm/s LVOT Vmean:  89.200 cm/s LVOT VTI:    0.257 m  AORTA Ao Root diam: 3.80 cm Ao Asc diam:  3.50 cm MITRAL VALVE               TRICUSPID VALVE MV Area (PHT): 2.99 cm    TR Peak grad:   14.1 mmHg MV Decel Time: 254 msec    TR Vmax:        188.00 cm/s MV E velocity: 58.70 cm/s MV A velocity: 55.70 cm/s  SHUNTS MV E/A ratio:  1.05        Systemic VTI:  0.26 m                            Systemic Diam: 1.07 cm Eleonore Chiquito MD Electronically signed by Eleonore Chiquito MD Signature Date/Time: 12/20/2020/11:00:48 AM    Final    VAS [place] LOWER EXTREMITY VENOUS (DVT)  Result Date: 12/28/2020  Lower Venous DVT Study Indications: Covid positive with elevated d-dimer.  Risk Factors: CAD, Lung mass. Performing Technologist: Oda Cogan RDMS, RVT  Examination Guidelines: A complete evaluation includes B-mode imaging, spectral Doppler, color Doppler, and power Doppler as needed of all accessible portions of each vessel. Bilateral testing is considered an integral part of a complete examination. Limited examinations for reoccurring indications may be performed as noted. The reflux portion of the exam is performed with the patient in reverse Trendelenburg.  +---------+---------------+---------+-----------+----------+--------------+ RIGHT    CompressibilityPhasicitySpontaneityPropertiesThrombus Aging +---------+---------------+---------+-----------+----------+--------------+ CFV      Full           Yes      Yes                                  +---------+---------------+---------+-----------+----------+--------------+ SFJ      Full                                                        +---------+---------------+---------+-----------+----------+--------------+ FV Prox  Full                                                        +---------+---------------+---------+-----------+----------+--------------+ FV Mid   Full                                                        +---------+---------------+---------+-----------+----------+--------------+ FV DistalFull                                                        +---------+---------------+---------+-----------+----------+--------------+ PFV      Full                                                        +---------+---------------+---------+-----------+----------+--------------+ POP      Full           Yes      Yes                                 +---------+---------------+---------+-----------+----------+--------------+ PTV      Full                                                        +---------+---------------+---------+-----------+----------+--------------+  PERO     Full                                                        +---------+---------------+---------+-----------+----------+--------------+   +---------+---------------+---------+-----------+----------+--------------+ LEFT     CompressibilityPhasicitySpontaneityPropertiesThrombus Aging +---------+---------------+---------+-----------+----------+--------------+ CFV      Full           Yes      Yes                                 +---------+---------------+---------+-----------+----------+--------------+ SFJ      Full                                                        +---------+---------------+---------+-----------+----------+--------------+ FV Prox  Full                                                         +---------+---------------+---------+-----------+----------+--------------+ FV Mid   Full                                                        +---------+---------------+---------+-----------+----------+--------------+ FV DistalFull                                                        +---------+---------------+---------+-----------+----------+--------------+ PFV      Full                                                        +---------+---------------+---------+-----------+----------+--------------+ POP      Full           Yes      Yes                                 +---------+---------------+---------+-----------+----------+--------------+ PTV      Full                                                        +---------+---------------+---------+-----------+----------+--------------+ PERO     Full                                                        +---------+---------------+---------+-----------+----------+--------------+  Summary: BILATERAL: - No evidence of deep vein thrombosis seen in the lower extremities, bilaterally. -No evidence of popliteal cyst, bilaterally.   *See table(s) above for measurements and observations. Electronically signed by Curt Jews MD on 12/28/2020 at 2:40:41 PM.    Final     Microbiology Recent Results (from the past 240 hour(s))  Culture, respiratory (non-expectorated)     Status: None   Collection Time: 01/04/21  8:23 AM   Specimen: Tracheal Aspirate; Respiratory  Result Value Ref Range Status   Specimen Description TRACHEAL ASPIRATE  Final   Special Requests NONE  Final   Gram Stain   Final    ABUNDANT WBC PRESENT,BOTH PMN AND MONONUCLEAR MODERATE GRAM POSITIVE COCCI RARE GRAM VARIABLE ROD    Culture   Final    ABUNDANT Normal respiratory flora-no Staph aureus or Pseudomonas seen Performed at [facility], [address] [place], [place] [address]    Report Status 01/06/2021 FINAL  Final  Culture,  respiratory (non-expectorated)     Status: None   Collection Time: 01/04/21  1:29 PM   Specimen: Bronchoalveolar Lavage; Respiratory  Result Value Ref Range Status   Specimen Description BRONCHIAL ALVEOLAR LAVAGE  Final   Special Requests NONE  Final   Gram Stain   Final    RARE WBC PRESENT, PREDOMINANTLY MONONUCLEAR NO ORGANISMS SEEN    Culture   Final    MODERATE Normal respiratory flora-no Staph aureus or Pseudomonas seen Performed at [facility], [address] [place], [place] [address]    Report Status 01/06/2021 FINAL  Final    Lab Basic Metabolic Panel: Recent Labs  Lab 01/03/21 0310 01/03/21 1148 01/04/21 0402 01-19-21 0039 01-19-21 0325  NA 152* 153* 157* 151* 156*  K 5.2* 4.8 4.5 5.5* 5.3*  CL 111  --  116*  --  115*  CO2 31  --  31  --  28  GLUCOSE 258*  --  108*  --  477*  BUN 111*  --  104*  --  145*  CREATININE 2.00*  --  2.05*  --  3.22*  CALCIUM 9.9  --  10.3  --  9.2  MG  --   --  3.1*  --  3.5*   Liver Function Tests: No results for input(s): AST, ALT, ALKPHOS, BILITOT, PROT, ALBUMIN in the last 168 hours. No results for input(s): LIPASE, AMYLASE in the last 168 hours. No results for input(s): AMMONIA in the last 168 hours. CBC: Recent Labs  Lab 01/03/21 1148 01/04/21 0402 2021-01-19 0039 Jan 19, 2021 0325  WBC  --  28.9*  --  24.8*  NEUTROABS  --  25.5*  --  21.0*  HGB 18.0* 19.1* 17.3* 18.4*  HCT 53.0* 60.8* 51.0 58.7*  MCV  --  96.8  --  99.0  PLT  --  74*  --  56*   Cardiac Enzymes: No results for input(s): CKTOTAL, CKMB, CKMBINDEX, TROPONINI in the last 168 hours. Sepsis Labs: Recent Labs  Lab 01/04/21 0402 01/19/2021 0325 January 19, 2021 0624 01/19/2021 0638 2021/01/19 1106  PROCALCITON  --   --  2.52  --   --   WBC 28.9* 24.8*  --   --   --   LATICACIDVEN  --   --   --  3.4* 3.4*    Procedures/Operations  Tracheostomy Central venous catheter insertion Endotracheal intubation   Rimas Gilham 01/09/2021, 11:33 AM

## 2021-01-17 NOTE — Procedures (Signed)
Arterial Catheter Insertion Procedure Note  MARLAND REINE  [id number]  10/20/1951  Date:2021/01/09  Time:9:37 AM    Provider Performing: Carolan Shiver    Procedure: Insertion of Arterial Line (29021) without US guidance  Indication(s) Blood pressure monitoring and/or need for frequent ABGs  Consent Unable to obtain consent due to emergent nature of procedure.  Anesthesia None   Time Out Verified patient identification, verified procedure, site/side was marked, verified correct patient position, special equipment/implants available, medications/allergies/relevant history reviewed, required imaging and test results available.   Sterile Technique Maximal sterile technique including full sterile barrier drape, hand hygiene, sterile gown, sterile gloves, mask, hair covering, sterile ultrasound probe cover (if used).   Procedure Description Area of catheter insertion was cleaned with chlorhexidine and draped in sterile fashion. Without real-time ultrasound guidance an arterial catheter was placed into the right radial artery.  Appropriate arterial tracings confirmed on monitor.     Complications/Tolerance None; patient tolerated the procedure well.   EBL Minimal   Specimen(s) None

## 2021-01-17 NOTE — Progress Notes (Signed)
Assisted tele visit to patient with wife.  Hanh Kertesz Anderson, RN   

## 2021-01-17 NOTE — Progress Notes (Signed)
OT Cancellation Note  Patient Details Name: RAMCES SHOMAKER MRN: [id number] DOB: 01-17-1951   Cancelled Treatment:    Reason Eval/Treat Not Completed: Patient not medically ready. Pt vented with increase of FiO2 to 100%. Will continue to monitor for therapy readiness  Lorre Munroe 01-16-21, 8:37 AM

## 2021-01-17 NOTE — Progress Notes (Signed)
NAME:  Mario Torres, MRN:  [id number], DOB:  [birth year], LOS: 11 ADMISSION DATE:  27-Dec-2020, CONSULTATION DATE:  12/28/2019 REFERRING MD:  Dr Loney Loh, CHIEF COMPLAINT:  Worsening shortness of breath and hypoxia  Brief History:  70 year old with hypertension hyperlipidemia unvaccinated presented 1/8 with COVID-pneumonia now with acute hypoxic respiratory failure requiring intubation on 1/11  Past Medical History:  Hyperlipidemia Hypertension Coronary artery disease  Significant Hospital Events:  Endotracheal intubation 12/27/2020 Right IJ placement 12/27/2020  Consults:    Procedures:  Right IJ 1/11>>> Endotracheal intubation 1/11>> 11/19 Tracheostomy 1/19   Significant Diagnostic Tests:   1/9 CT angiogram of chest: Diffuse bilateral ground-glass airspace opacities. Differential includes atypical pneumonias such as viral or fungal, interstitial pneumonias, edema related to volume overload/CHF, chronic interstitial diseases, hypersensitivity pneumonitis, and respiratory bronchiolitis. Favor diffuse bilateral COVID-related pneumonia. No pulmonary embolism, with mild study limitations detailed above. Cardiomegaly. Three-vessel coronary artery calcifications. Bilateral renal masses, incompletely imaged, presumed cysts. Consider nonemergent follow-up with renal CT or MRI after current pulmonary issues are resolved   Micro Data:  SARS coronavirus positive 1/11 MRSA PCR negative 1/19 respiratory culture >  Antimicrobials:  Azithromycin 1/9 > Remdesivir 1/9 > Tocilizumab 1/9 Zosyn 1/19 > Vancomycin 1/19 >  Interim History / Subjective:   Patient continued to remain unresponsive, last night he started spiking fevers with T-max of 102.7, became hypotensive requiring high-dose vasopressors despite started on antibiotic vancomycin and Zosyn  Objective   Blood pressure 117/62, pulse 89, temperature (!) 102.7 F (39.3 C), temperature source Axillary, resp. rate (!) 41, height 5'  9" (1.753 m), weight 95.7 kg, SpO2 (!) 89 %.    Vent Mode: PRVC FiO2 (%):  [60 %-100 %] 100 % Set Rate:  [25 bmp-35 bmp] 25 bmp Vt Set:  [420 mL] 420 mL PEEP:  [5 cmH20] 5 cmH20 Plateau Pressure:  [14 cmH20-18 cmH20] 14 cmH20   Intake/Output Summary (Last 24 hours) at 01/04/2021 1330 Last data filed at 01/08/2021 0800 Gross per 24 hour  Intake 4543.73 ml  Output 945 ml  Net 3598.73 ml   Filed Weights   01/03/21 1301 01/04/21 0500 01/01/2021 0417  Weight: 96.2 kg 95.7 kg 95.7 kg    Examination: General: Elderly male, lying on the bed, unresponsive, status post trach HENT: Atraumatic, normocephalic, dry mucous membranes. Lungs: Reduced air entry all over, no wheezes or rhonchi Cardiovascular: Regular rate and rhythm Abdomen: Soft, nontender nondistended bowel sounds present Extremities: No significant edema Neuro: Eyes closed, not following commands, rhythmic movement of bilateral upper extremities no motor response to painful stimuli Skin: No rash  Resolved Hospital Problem list   Demand cardiac ischemia  Assessment & Plan:   Acute hypoxic/hypercapnic respiratory failure due to ARDS from COVID-19 pneumonia Acute metabolic/toxic encephalopathy Patient continued remain on full vent and ventilatory support, became hypoxic requiring 100% FiO2 He is a status post trach yesterday Remain severely encephalopathic, remained off sedation for the last few days CT head and EEG was negative for acute findings Continue airborne and contact precautions He is status post Tocilizumab on 1/9  Septic shock due to HCAP with gram-positive cocci and possible acalculous cholecystitis Patient became hypotensive with rising white count, tachycardia Received 3 L of IV fluid Currently on 50 mics of Levophed and 0.03 of vasopressin Hydrocortisone 50 every 6 added Respiratory culture is growing gram-positive cocci Bedside ultrasonography showed distended gallbladder Official ultrasound right upper  quadrant is requested, if it shows a calculus cholecystitis, will ask IR for possible gallbladder drainage  Worsening AKI Hyperkalemia/hypernatremia Patient serum creatinine started trending up again due to worsening septic shock Serum potassium remain around 5-5 0.5 He received 3 L IV fluid Serum sodium remain elevated  New diagnosis of diabetes type 2 hyperglycemia caused by steroid Patient blood sugar remains elevated into 300s Started on insulin infusion   Best practice (evaluated daily)  Diet: Tube feeding per protocol Pain/Anxiety/Delirium protocol (if indicated): As needed fentanyl VAP protocol (if indicated): Ordered DVT prophylaxis: Subcu heparin GI prophylaxis: Pepcid Glucose control: Insulin infusion Mobility: Bedrest Disposition: ICU  Goals of Care:  Last date of multidisciplinary goals of care discussion:1/20 Family and staff present: Patient's wife over the phone Summary of discussion: Updated about patient's worsening condition and now with septic shock, patient's wife decided to change CODE STATUS to DNR and considering comfort care options if no improvement in next 24 hours Follow up goals of care discussion due: 1/27 Code Status: DNR  Labs   CBC: Recent Labs  Lab 12/30/20 0504 01/01/21 0352 01/02/21 0305 01/03/21 1148 01/04/21 0402 01/16/2021 0039 12/25/2020 0325  WBC 10.7* 8.5 8.1  --  28.9*  --  24.8*  NEUTROABS 9.7*  --   --   --  25.5*  --  21.0*  HGB 13.9 14.6 16.9 18.0* 19.1* 17.3* 18.4*  HCT 40.7 43.6 50.5 53.0* 60.8* 51.0 58.7*  MCV 93.3 95.0 93.9  --  96.8  --  99.0  PLT 200 160 140*  --  74*  --  56*    Basic Metabolic Panel: Recent Labs  Lab 12/30/20 0504 12/30/20 1545 12/31/20 0444 01/01/21 0352 01/02/21 0305 01/03/21 0310 01/03/21 1148 01/04/21 0402 01/04/2021 0039 12/26/2020 0325  NA 140 142 144 150* 151* 152* 153* 157* 151* 156*  K 4.4 4.3 4.7 4.8 5.2* 5.2* 4.8 4.5 5.5* 5.3*  CL 103 107 108 112* 110 111  --  116*  --  115*   CO2 25 25 25 28 31 31   --  31  --  28  GLUCOSE 321* 326* 256* 258* 276* 258*  --  108*  --  477*  BUN 111* 110* 114* 113* 110* 111*  --  104*  --  145*  CREATININE 2.57* 2.27* 2.04* 1.92* 1.87* 2.00*  --  2.05*  --  3.22*  CALCIUM 8.5* 8.5* 8.9 9.2 9.8 9.9  --  10.3  --  9.2  MG 3.1* 3.0* 3.1*  --   --   --   --  3.1*  --  3.5*  PHOS 5.5*  --   --   --   --   --   --   --   --   --    GFR: Estimated Creatinine Clearance: 24.7 mL/min (A) (by C-G formula based on SCr of 3.22 mg/dL (H)). Recent Labs  Lab 01/01/21 0352 01/02/21 0305 01/04/21 0402 12/19/2020 0325 12/19/2020 0624 01/11/2021 0638 01/09/2021 1106  PROCALCITON  --   --   --   --  2.52  --   --   WBC 8.5 8.1 28.9* 24.8*  --   --   --   LATICACIDVEN  --   --   --   --   --  3.4* 3.4*    Liver Function Tests: Recent Labs  Lab 12/30/20 0504 12/31/20 0444 01/01/21 0352 01/02/21 0305  AST 20 21 21 29   ALT 23 25 25  35  ALKPHOS 63 57 58 65  BILITOT 0.8 0.8 0.8 1.0  PROT 4.9* 5.0* 5.1* 5.7*  ALBUMIN 2.5* 2.5* 2.5* 2.8*   No results for input(s): LIPASE, AMYLASE in the last 168 hours. No results for input(s): AMMONIA in the last 168 hours.  ABG    Component Value Date/Time   PHART 7.479 (H) 01/01/2021 0039   PCO2ART 34.4 01/09/2021 0039   PO2ART 81 (L) 01/06/2021 0039   HCO3 25.4 12/26/2020 0039   TCO2 26 12/30/2020 0039   ACIDBASEDEF 1.0 12/28/2020 0844   O2SAT 96.0 01/12/2021 0039     Coagulation Profile: No results for input(s): INR, PROTIME in the last 168 hours.  Cardiac Enzymes: No results for input(s): CKTOTAL, CKMB, CKMBINDEX, TROPONINI in the last 168 hours.  HbA1C: Hgb A1c MFr Bld  Date/Time Value Ref Range Status  12/27/2020 02:49 AM 6.5 (H) 4.8 - 5.6 % Final    Comment:    (NOTE) Pre diabetes:          5.7%-6.4%  Diabetes:              >6.4%  Glycemic control for   <7.0% adults with diabetes   08/07/2013 02:52 PM 5.4 <5.7 % Final    Comment:    (NOTE)                                                                        According to the ADA Clinical Practice Recommendations for 2011, when HbA1c is used as a screening test:  >=6.5%   Diagnostic of Diabetes Mellitus           (if abnormal result is confirmed) 5.7-6.4%   Increased risk of developing Diabetes Mellitus References:Diagnosis and Classification of Diabetes Mellitus,Diabetes Care,2011,34(Suppl 1):S62-S69 and Standards of Medical Care in         Diabetes - 2011,Diabetes Care,2011,34 (Suppl 1):S11-S61.    CBG: Recent Labs  Lab 01/07/2021 0545 01/08/2021 0649 01/03/2021 0732 12/18/2020 1008 12/25/2020 1139  GLUCAP 355* 362* 321* 187* 171*    Total critical care time: 44 minutes  Performed by: Cheri Fowler   Critical care time was exclusive of separately billable procedures and treating other patients.   Critical care was necessary to treat or prevent imminent or life-threatening deterioration.   Critical care was time spent personally by me on the following activities: development of treatment plan with patient and/or surrogate as well as nursing, discussions with consultants, evaluation of patient's response to treatment, examination of patient, obtaining history from patient or surrogate, ordering and performing treatments and interventions, ordering and review of laboratory studies, ordering and review of radiographic studies, pulse oximetry and re-evaluation of patient's condition.   Cheri Fowler MD [facility] Pager: [phone] Mobile: [phone]

## 2021-01-17 NOTE — Progress Notes (Signed)
Spoke with patient's wife at length at bedside, considering his worsening condition, patient's wife decided to proceed with comfort care.  Will discontinue IV vasopressors, antibiotics and continue with IV fentanyl infusion. She would like to perform palliative disconnection from the ventilator.  Comfort care orders were placed  Cheri Fowler MD [facility] Pager: [phone] Mobile: [phone]

## 2021-01-17 NOTE — Progress Notes (Signed)
RT NOTE:  ABG results reported to [facility].

## 2021-01-17 NOTE — Progress Notes (Signed)
SLP Cancellation Note  Patient Details Name: Mario Torres MRN: [id number] DOB: [birth year]   Cancelled treatment:       Reason Eval/Treat Not Completed: Patient not medically ready. Pt with trach, ventilated by encephalopathic. Not yet appropriate for SLP interventions. Will continue to follow chart for readiness.    Jaislyn Blinn, Riley Nearing 2021-01-07, 7:57 AM

## 2021-01-17 NOTE — Progress Notes (Signed)
eLink Physician-Brief Progress Note Patient Name: Mario Torres DOB: [birth year] MRN: [id number]   Date of Service  12/29/2020  HPI/Events of Note  Patient continues to have tachypnea despite PRN Fentanyl, and trial of pressure control ventilation. Blood sugar 419 mg / dl despite Levemir and robust sliding scale insulin coverage.  eICU Interventions  Pressure support was tried without a change in respiratory rate (neurogenic drive?), will leave him on [facility] for now, will order a head CT  to r/o an intracranial event. Will order phase 2  iv Insulin hyperglycemia protocol.        Mario Torres 12/27/2020, 3:57 AM

## 2021-01-17 NOTE — Progress Notes (Signed)
Notified E-link increased Levophed to max of 6mcg/min, requested vasopressin.

## 2021-01-17 NOTE — Progress Notes (Signed)
Pharmacy Antibiotic Note  Mario Torres is a 70 y.o. male admitted on 12/26/2020 with sepsis - abrupt drop in blood pressure and Tm of 102.2.  Pharmacy has been consulted for Vancomycin dosing. Pt already on Zosyn (Day #2).  Plan: Vancomycin 2gm IV now then 1270m IV Q 48 hrs. Goal AUC 400-550. Expected AUC: 525 SCr used: 3.22, Vd 0.5 Will f/u renal function, micro data, and pt's clinical condition Vanc levels prn   Height: _0  (175.3 cm) Weight: 95.7 kg (210 lb 15.7 oz) IBW/kg (Calculated) : 70.7  Temp (24hrs), Avg:100.5 F (38.1 C), Min:99.8 F (37.7 C), Max:102.2 F (39 C)  Recent Labs  Lab 12/30/20 0504 12/30/20 1545 01/01/21 0352 01/02/21 0305 01/03/21 0310 01/04/21 0402 0February 16, [id number]  WBC 10.7*  --  8.5 8.1  --  28.9* 24.8*  CREATININE 2.57*   < > 1.92* 1.87* 2.00* 2.05* 3.22*   < > = values in this interval not displayed.    Estimated Creatinine Clearance: 24.7 mL/min (A) (by C-G formula based on SCr of 3.22 mg/dL (H)).    Allergies  Allergen Reactions  . Crestor [Rosuvastatin Calcium] Other (See Comments)    Joint pain, ankle pain     Antimicrobials this admission: 1/19 Zosyn >>  1/20 Vanc >>  1/8 Azith >> 1/12  Microbiology results: 1/20 BCx:  1/19 BAL: 1/19 Trach asp: 1/11 MRSA PCR: negative  Thank you for allowing pharmacy to be a part of this patient's care.  CFranky Macho1February 16, 20226:40 AM

## 2021-01-17 NOTE — Progress Notes (Signed)
Wife and pastor at bedside levophed and vasopressin stopped and fentanyl started as per orders.

## 2021-01-17 NOTE — Progress Notes (Signed)
PT Cancellation Note  Patient Details Name: KOSEI RHODES MRN: [id number] DOB: [birth year]   Cancelled Treatment:    Reason Eval/Treat Not Completed: Patient not medically ready   Kymir Coles B Shant Hence 12/19/2020, 7:34 AM  Merryl Hacker, PT Acute Rehabilitation Services Pager: [phone] Office: [phone]

## 2021-01-17 NOTE — Progress Notes (Signed)
Notified E-link that Precedex was titrated up to 1.59mcg/kg/hr with no improvement in respiratory rate, decreasing back to previous rate of 0.4 to give minimal sedation and promote awakening.

## 2021-01-17 NOTE — Progress Notes (Signed)
Notified E-link PRN Fentanyl did not seem to effect respiratory rate, when Fentanyl was given rate was 44, now it is 42. Also notified E-link of hyperglycemia, checked finger stick as well as blood sugar from central line, central line blood glucose was 419. Drew AM labs and sent them off, awaiting result.

## 2021-01-17 NOTE — Progress Notes (Signed)
eLink Physician-Brief Progress Note Patient Name: Mario Torres DOB: Nov 24, 1951 MRN: [id number]   Date of Service  01/02/2021  HPI/Events of Note  Patient with a decent ABG on current ventilator settings despite his excessive respiratory drive (1.77/ 11.6/ 57/ 90.3).  eICU Interventions  Will order low dose PRN iv Fentanyl to attempt to blunt respiratory drive modestly. Morning labs ordered.        Thomasene Lot Cyree Chuong 01/11/2021, 2:47 AM

## 2021-01-17 NOTE — Progress Notes (Signed)
eLink Physician-Brief Progress Note Patient Name: Mario Torres DOB: August 24, 1951 MRN: [id number]   Date of Service  12/24/2020  HPI/Events of Note  Patient with an abrupt drop in blood pressure associated with a drop in saturation, he's also spiking a temperature of 102.2. CBC without a drop in hemoglobin.  eICU Interventions  Vasopressin infusion ordered, Albumin 5 % 250 ml iv bolus x 2, stat portable CXR r/o pneumothorax,  Procalcitonin ordered, empiric antibiotics ordered, lactic acid ordered.        Okoronkwo U Ogan 12/29/2020, 6:30 AM

## 2021-01-17 DEATH — deceased

## 2022-01-14 IMAGING — DX DG CHEST 1V PORT
1 series · 1 of 1 positions shown · non-contrast
Comparison: Portable chest 12/27/2020 and earlier.

CLINICAL DATA: 69-year-old male [id number].  Intubated.

EXAM:
PORTABLE CHEST 1 VIEW

[chest ap]
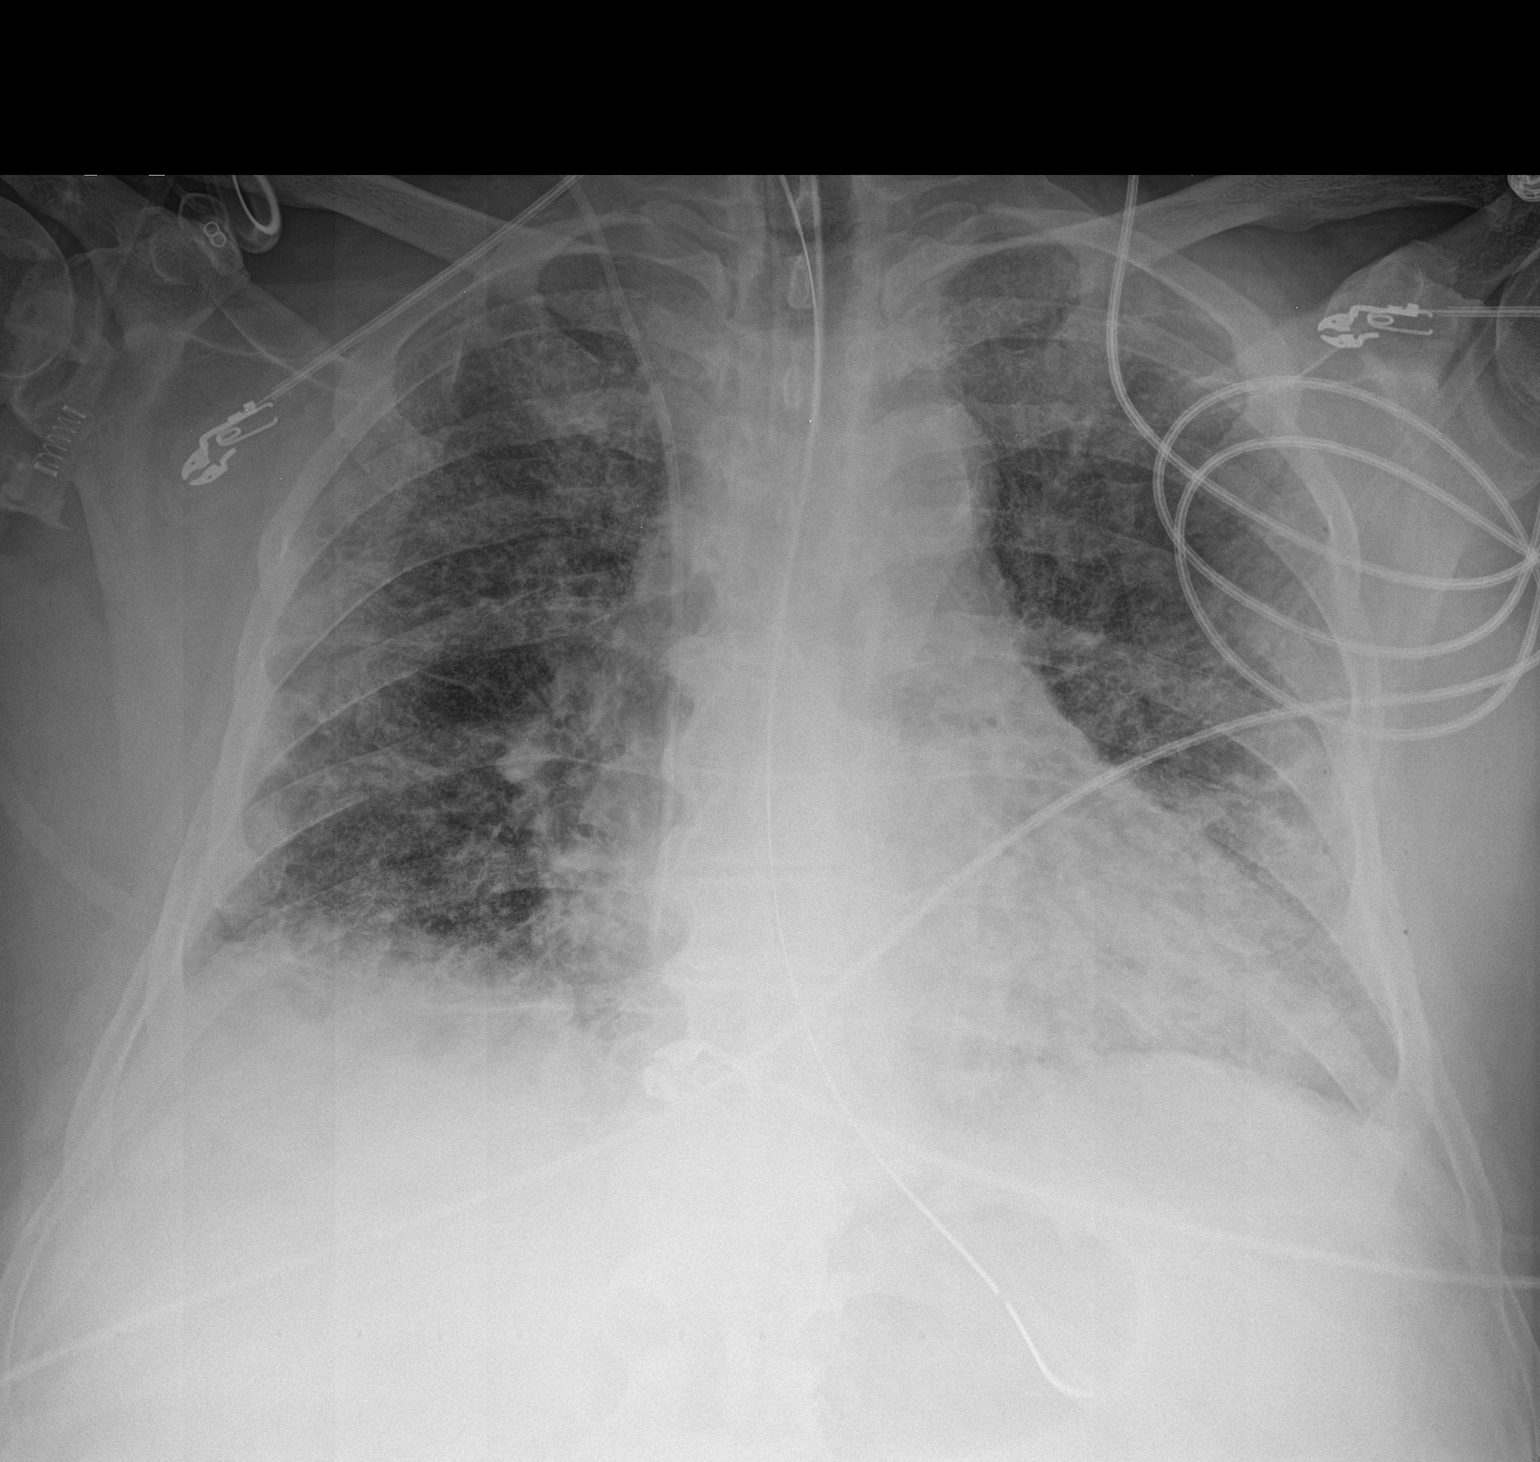

[1 of 1 positions shown; findings below may reference images not displayed]

FINDINGS: Portable AP semi upright view at 1251 hours. Right IJ approach
central line tip projects about 10 mm below the expected cavoatrial
junction level, likely within the right atrium. Otherwise stable
lines and tubes. Stable lung volumes. Mediastinal contours remain
within normal limits. No pneumothorax.

Diffuse coarse bilateral pulmonary opacity with patchy peripheral
and basilar confluence. Ventilation has mildly improved since
12/27/2020. No pleural effusion is evident.

Paucity of bowel gas.
IMPRESSION: 1. Right IJ approach central line tip likely within the right
atrium, about 10 mm below the expected cavoatrial junction level.
Consider retraction.
2. Otherwise stable lines and tubes.
3. Diffuse pneumonia with mildly improved ventilation since
12/27/2020.

## 2022-01-16 IMAGING — DX DG CHEST 1V PORT
1 series · 1 of 1 positions shown · non-contrast
Comparison: Portable chest 12/30/2020 and earlier.

CLINICAL DATA: 69-year-old male [id number].  Intubated.

EXAM:
PORTABLE CHEST 1 VIEW

[chest]
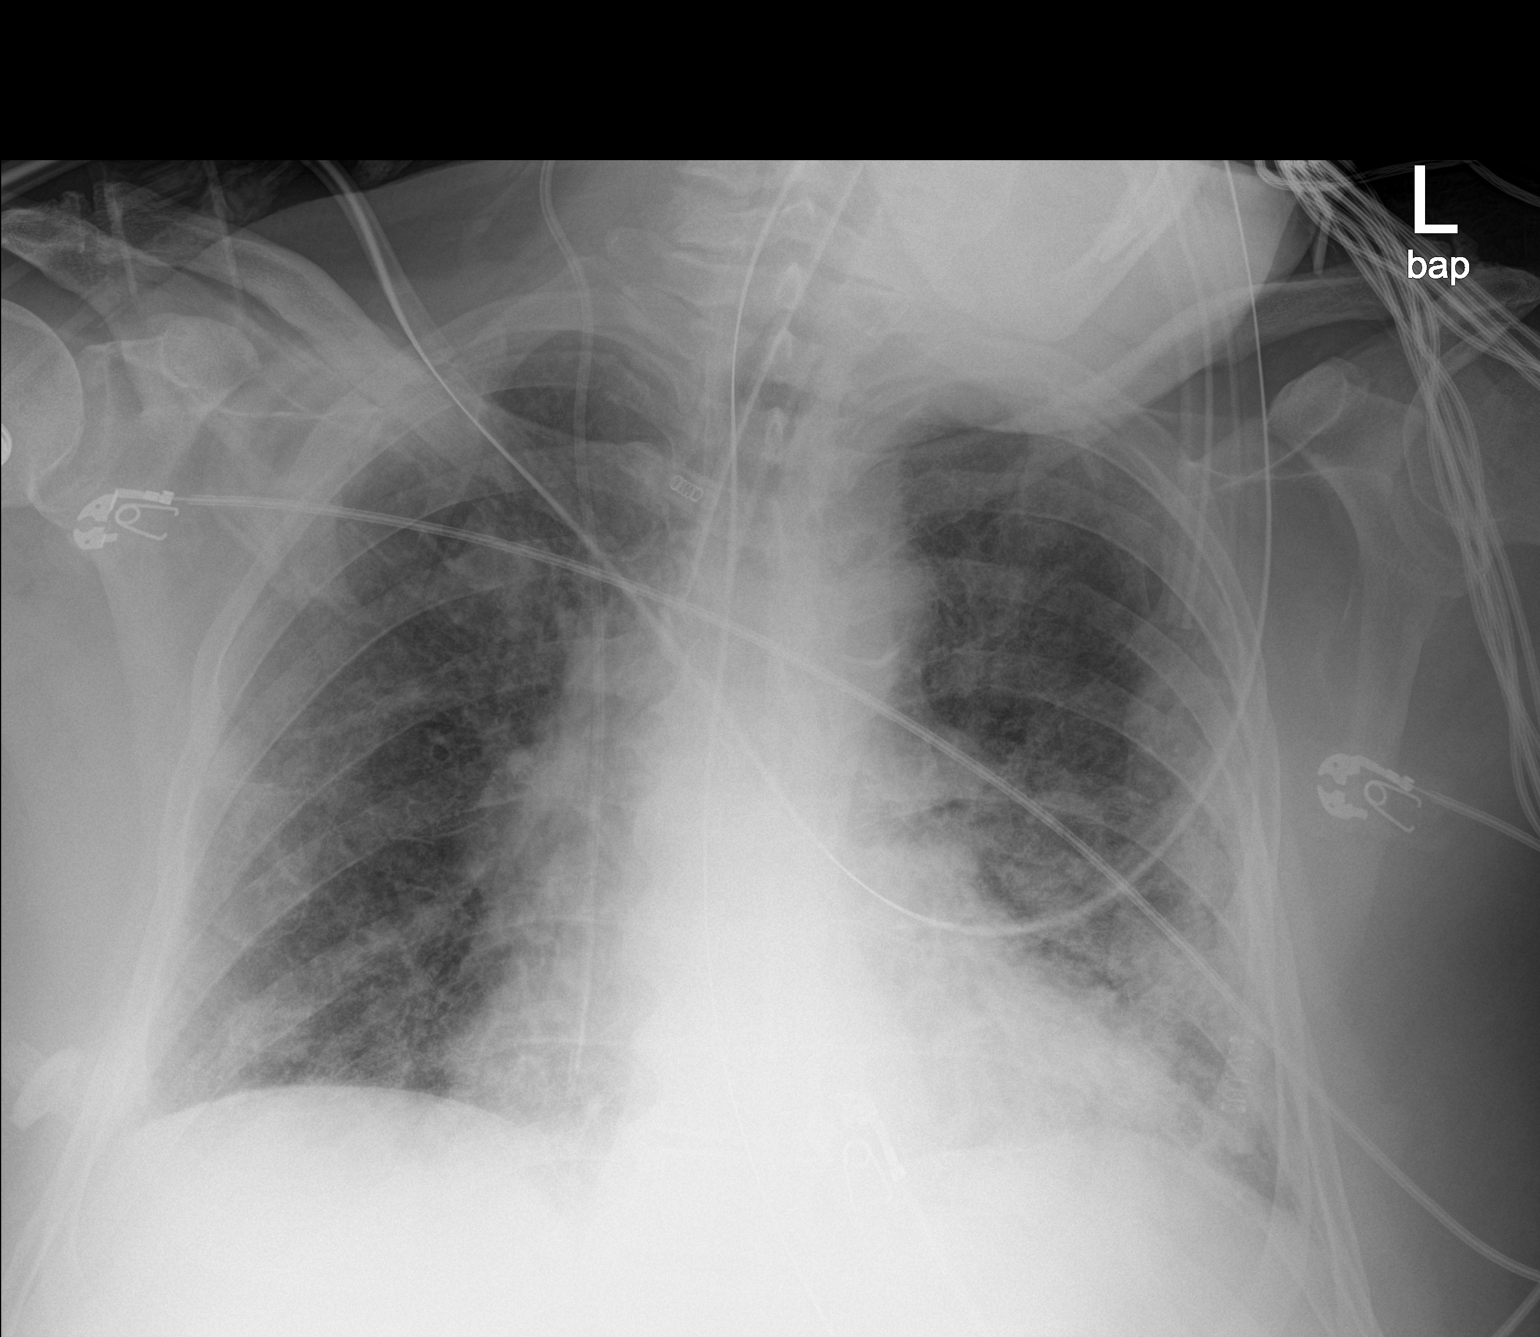

[1 of 1 positions shown; findings below may reference images not displayed]

FINDINGS: Portable AP semi upright view at 2790 hours. Stable lines and tubes.
Right IJ central line tip remains at the level of the right atrium.

Stable lung volumes and mediastinal contours. Diffuse coarse
pulmonary interstitial opacity with some peripheral confluence. No
pneumothorax or pleural effusion. Ventilation mildly improved from
12/27/2020.
IMPRESSION: 1. Stable lines and tubes - with Right IJ approach central line tip
at the right atrium level. Retract 10 mm for more optimal placement.
2. Bilateral WBFKL-CE pneumonia with ventilation mildly improved
from 12/27/2020.
